# Patient Record
Sex: Female | Born: 1935 | Race: White | Hispanic: No | Smoking: Never smoker
Health system: Southern US, Community
[De-identification: ages and names within clinical notes are randomized; demographics above are authoritative.]

## PROBLEM LIST (undated history)

## (undated) DIAGNOSIS — R0600 Dyspnea, unspecified: Secondary | ICD-10-CM

## (undated) DIAGNOSIS — I10 Essential (primary) hypertension: Secondary | ICD-10-CM

## (undated) DIAGNOSIS — E039 Hypothyroidism, unspecified: Secondary | ICD-10-CM

## (undated) DIAGNOSIS — T8859XA Other complications of anesthesia, initial encounter: Secondary | ICD-10-CM

## (undated) DIAGNOSIS — J45909 Unspecified asthma, uncomplicated: Secondary | ICD-10-CM

## (undated) DIAGNOSIS — C801 Malignant (primary) neoplasm, unspecified: Secondary | ICD-10-CM

## (undated) DIAGNOSIS — J449 Chronic obstructive pulmonary disease, unspecified: Secondary | ICD-10-CM

## (undated) DIAGNOSIS — N189 Chronic kidney disease, unspecified: Secondary | ICD-10-CM

## (undated) DIAGNOSIS — M1990 Unspecified osteoarthritis, unspecified site: Secondary | ICD-10-CM

## (undated) HISTORY — PX: APPENDECTOMY: SHX54

## (undated) HISTORY — PX: ABDOMINAL HYSTERECTOMY: SHX81

---

## 2004-10-07 ENCOUNTER — Ambulatory Visit: Payer: Self-pay

## 2005-03-24 ENCOUNTER — Ambulatory Visit: Payer: Self-pay | Admitting: Unknown Physician Specialty

## 2005-04-28 ENCOUNTER — Ambulatory Visit: Payer: Self-pay | Admitting: Internal Medicine

## 2005-11-30 ENCOUNTER — Ambulatory Visit: Payer: Self-pay | Admitting: Internal Medicine

## 2005-12-08 ENCOUNTER — Ambulatory Visit: Payer: Self-pay | Admitting: Internal Medicine

## 2006-12-03 ENCOUNTER — Ambulatory Visit: Payer: Self-pay | Admitting: Internal Medicine

## 2007-01-01 ENCOUNTER — Ambulatory Visit: Payer: Self-pay | Admitting: Family Medicine

## 2008-01-01 ENCOUNTER — Ambulatory Visit: Payer: Self-pay | Admitting: Internal Medicine

## 2008-01-02 ENCOUNTER — Ambulatory Visit: Payer: Self-pay | Admitting: Internal Medicine

## 2009-01-07 ENCOUNTER — Ambulatory Visit: Payer: Self-pay | Admitting: Internal Medicine

## 2009-06-15 ENCOUNTER — Ambulatory Visit: Payer: Self-pay | Admitting: Internal Medicine

## 2009-07-12 ENCOUNTER — Ambulatory Visit: Payer: Self-pay | Admitting: Internal Medicine

## 2009-08-26 ENCOUNTER — Ambulatory Visit: Payer: Self-pay | Admitting: Surgery

## 2010-01-18 ENCOUNTER — Ambulatory Visit: Payer: Self-pay | Admitting: Internal Medicine

## 2010-07-22 ENCOUNTER — Ambulatory Visit: Payer: Self-pay | Admitting: Internal Medicine

## 2011-02-23 ENCOUNTER — Ambulatory Visit: Payer: Self-pay | Admitting: Internal Medicine

## 2011-07-13 ENCOUNTER — Ambulatory Visit: Payer: Self-pay

## 2011-10-06 ENCOUNTER — Ambulatory Visit: Payer: Self-pay

## 2012-04-30 ENCOUNTER — Ambulatory Visit: Payer: Self-pay

## 2012-08-01 ENCOUNTER — Ambulatory Visit: Payer: Self-pay

## 2013-04-29 ENCOUNTER — Ambulatory Visit: Payer: Self-pay | Admitting: Internal Medicine

## 2014-04-30 ENCOUNTER — Ambulatory Visit: Payer: Self-pay

## 2015-01-07 ENCOUNTER — Ambulatory Visit
Admission: RE | Admit: 2015-01-07 | Discharge: 2015-01-07 | Disposition: A | Discharge status: Home / Self Care | Payer: Commercial Managed Care - HMO | Source: Ambulatory Visit | Attending: Nurse Practitioner | Admitting: Nurse Practitioner

## 2015-01-07 ENCOUNTER — Other Ambulatory Visit: Payer: Self-pay | Admitting: Nurse Practitioner

## 2015-01-07 DIAGNOSIS — M545 Low back pain, unspecified: Secondary | ICD-10-CM

## 2015-01-07 DIAGNOSIS — M488X6 Other specified spondylopathies, lumbar region: Secondary | ICD-10-CM | POA: Diagnosis not present

## 2015-07-08 ENCOUNTER — Encounter: Payer: Self-pay | Admitting: *Deleted

## 2015-07-09 ENCOUNTER — Ambulatory Visit: Payer: PPO | Admitting: Anesthesiology

## 2015-07-09 ENCOUNTER — Ambulatory Visit
Admission: RE | Admit: 2015-07-09 | Discharge: 2015-07-09 | Disposition: A | Discharge status: Home / Self Care | Payer: PPO | Source: Ambulatory Visit | Attending: Unknown Physician Specialty | Admitting: Unknown Physician Specialty

## 2015-07-09 ENCOUNTER — Encounter
Admission: RE | Disposition: A | Discharge status: Home / Self Care | Payer: Self-pay | Source: Ambulatory Visit | Attending: Unknown Physician Specialty

## 2015-07-09 DIAGNOSIS — J45909 Unspecified asthma, uncomplicated: Secondary | ICD-10-CM | POA: Diagnosis not present

## 2015-07-09 DIAGNOSIS — D122 Benign neoplasm of ascending colon: Secondary | ICD-10-CM | POA: Insufficient documentation

## 2015-07-09 DIAGNOSIS — Z7982 Long term (current) use of aspirin: Secondary | ICD-10-CM | POA: Insufficient documentation

## 2015-07-09 DIAGNOSIS — Z79899 Other long term (current) drug therapy: Secondary | ICD-10-CM | POA: Diagnosis not present

## 2015-07-09 DIAGNOSIS — J449 Chronic obstructive pulmonary disease, unspecified: Secondary | ICD-10-CM | POA: Insufficient documentation

## 2015-07-09 DIAGNOSIS — Z1211 Encounter for screening for malignant neoplasm of colon: Secondary | ICD-10-CM | POA: Insufficient documentation

## 2015-07-09 DIAGNOSIS — M1990 Unspecified osteoarthritis, unspecified site: Secondary | ICD-10-CM | POA: Insufficient documentation

## 2015-07-09 DIAGNOSIS — K5730 Diverticulosis of large intestine without perforation or abscess without bleeding: Secondary | ICD-10-CM | POA: Insufficient documentation

## 2015-07-09 DIAGNOSIS — N189 Chronic kidney disease, unspecified: Secondary | ICD-10-CM | POA: Diagnosis not present

## 2015-07-09 DIAGNOSIS — K640 First degree hemorrhoids: Secondary | ICD-10-CM | POA: Diagnosis not present

## 2015-07-09 DIAGNOSIS — E039 Hypothyroidism, unspecified: Secondary | ICD-10-CM | POA: Insufficient documentation

## 2015-07-09 DIAGNOSIS — I129 Hypertensive chronic kidney disease with stage 1 through stage 4 chronic kidney disease, or unspecified chronic kidney disease: Secondary | ICD-10-CM | POA: Insufficient documentation

## 2015-07-09 DIAGNOSIS — K649 Unspecified hemorrhoids: Secondary | ICD-10-CM | POA: Diagnosis not present

## 2015-07-09 DIAGNOSIS — K5790 Diverticulosis of intestine, part unspecified, without perforation or abscess without bleeding: Secondary | ICD-10-CM | POA: Diagnosis not present

## 2015-07-09 DIAGNOSIS — K635 Polyp of colon: Secondary | ICD-10-CM | POA: Diagnosis not present

## 2015-07-09 HISTORY — DX: Hypothyroidism, unspecified: E03.9

## 2015-07-09 HISTORY — DX: Unspecified osteoarthritis, unspecified site: M19.90

## 2015-07-09 HISTORY — DX: Chronic kidney disease, unspecified: N18.9

## 2015-07-09 HISTORY — DX: Chronic obstructive pulmonary disease, unspecified: J44.9

## 2015-07-09 HISTORY — PX: COLONOSCOPY WITH PROPOFOL: SHX5780

## 2015-07-09 HISTORY — DX: Essential (primary) hypertension: I10

## 2015-07-09 HISTORY — DX: Unspecified asthma, uncomplicated: J45.909

## 2015-07-09 SURGERY — COLONOSCOPY WITH PROPOFOL
Anesthesia: General

## 2015-07-09 MED ORDER — PROPOFOL 500 MG/50ML IV EMUL
INTRAVENOUS | Status: DC | PRN
  Administered 2015-07-09: 120 ug/kg/min via INTRAVENOUS

## 2015-07-09 MED ORDER — LIDOCAINE HCL (PF) 1 % IJ SOLN
2.0000 mL | Freq: Once | INTRAMUSCULAR | Status: AC
  Administered 2015-07-09: 0.03 mL via INTRADERMAL

## 2015-07-09 MED ORDER — PROPOFOL 10 MG/ML IV BOLUS
INTRAVENOUS | Status: DC | PRN
  Administered 2015-07-09: 27.7 mg via INTRAVENOUS

## 2015-07-09 MED ORDER — MIDAZOLAM HCL 5 MG/5ML IJ SOLN
INTRAMUSCULAR | Status: DC | PRN
  Administered 2015-07-09: 1 mg via INTRAVENOUS

## 2015-07-09 MED ORDER — FENTANYL CITRATE (PF) 100 MCG/2ML IJ SOLN
INTRAMUSCULAR | Status: DC | PRN
  Administered 2015-07-09: 50 ug via INTRAVENOUS

## 2015-07-09 MED ORDER — LIDOCAINE HCL (PF) 1 % IJ SOLN
INTRAMUSCULAR | Status: AC
  Administered 2015-07-09: 0.03 mL via INTRADERMAL
  Filled 2015-07-09: qty 2

## 2015-07-09 MED ORDER — SODIUM CHLORIDE 0.9 % IV SOLN
INTRAVENOUS | Status: DC
  Administered 2015-07-09: 1000 mL via INTRAVENOUS
  Administered 2015-07-09: 14:00:00 via INTRAVENOUS

## 2015-07-09 MED ORDER — SODIUM CHLORIDE 0.9 % IV SOLN: INTRAVENOUS | Status: DC

## 2015-07-09 NOTE — Anesthesia Preprocedure Evaluation (Signed)
Anesthesia Evaluation  Patient identified by MRN, date of birth, ID band Patient awake    Reviewed: Allergy & Precautions, NPO status , Patient's Chart, lab work & pertinent test results  History of Anesthesia Complications Negative for: history of anesthetic complications  Airway Mallampati: III       Dental  (+) Upper Dentures, Lower Dentures   Pulmonary asthma , COPD,  COPD inhaler,           Cardiovascular hypertension, Pt. on medications and Pt. on home beta blockers      Neuro/Psych    GI/Hepatic   Endo/Other  Hypothyroidism   Renal/GU Renal InsufficiencyRenal disease     Musculoskeletal  (+) Arthritis , Osteoarthritis,    Abdominal   Peds  Hematology   Anesthesia Other Findings   Reproductive/Obstetrics                             Anesthesia Physical Anesthesia Plan  ASA: III  Anesthesia Plan: General   Post-op Pain Management:    Induction: Intravenous  Airway Management Planned: Nasal Cannula  Additional Equipment:   Intra-op Plan:   Post-operative Plan:   Informed Consent: I have reviewed the patients History and Physical, chart, labs and discussed the procedure including the risks, benefits and alternatives for the proposed anesthesia with the patient or authorized representative who has indicated his/her understanding and acceptance.     Plan Discussed with:   Anesthesia Plan Comments:         Anesthesia Quick Evaluation

## 2015-07-09 NOTE — H&P (Signed)
Primary Care Physician:  Ronnell Freshwater, NP Primary Gastroenterologist:  Dr. Vira Agar  Pre-Procedure History & Physical: HPI:  Brenda Tucker is a 82 y.o. female is here for an colonoscopy.   Past Medical History  Diagnosis Date  . Arthritis   . Asthma   . COPD (chronic obstructive pulmonary disease) ([place])   . Hypertension   . Hypothyroidism   . Chronic kidney disease     Past Surgical History  Procedure Laterality Date  . Appendectomy    . Abdominal hysterectomy      Prior to Admission medications   Medication Sig Start Date End Date Taking? Authorizing Provider  albuterol (PROVENTIL) (2.5 MG/3ML) 0.083% nebulizer solution Take 2.5 mg by nebulization every 6 (six) hours as needed for wheezing or shortness of breath.   Yes Historical Provider, MD  aspirin EC 81 MG tablet Take 81 mg by mouth daily.   Yes Historical Provider, MD  carvedilol (COREG) 12.5 MG tablet Take 12.5 mg by mouth 2 (two) times daily with a meal.   Yes Historical Provider, MD  Fluticasone-Salmeterol (ADVAIR) 250-50 MCG/DOSE AEPB Inhale 1 puff into the lungs 2 (two) times daily.   Yes Historical Provider, MD  hydrochlorothiazide (HYDRODIURIL) 12.5 MG tablet Take 12.5 mg by mouth daily.   Yes Historical Provider, MD  levothyroxine (SYNTHROID, LEVOTHROID) 50 MCG tablet Take 50 mcg by mouth daily before breakfast.   Yes Historical Provider, MD  losartan (COZAAR) 25 MG tablet Take 25 mg by mouth daily.   Yes Historical Provider, MD  omeprazole (PRILOSEC) 40 MG capsule Take 40 mg by mouth daily.   Yes Historical Provider, MD  traMADol (ULTRAM) 50 MG tablet Take 50 mg by mouth daily.   Yes Historical Provider, MD  vitamin B-12 (CYANOCOBALAMIN) 1000 MCG tablet Take 1,000 mcg by mouth daily.   Yes Historical Provider, MD    Allergies as of 04/28/2015  . (Not on File)    History reviewed. No pertinent family history.  Social History   Social History  . Marital Status: Widowed    Spouse Name: N/A  . Number of  Children: N/A  . Years of Education: N/A   Occupational History  . Not on file.   Social History Main Topics  . Smoking status: Never Smoker   . Smokeless tobacco: Never Used  . Alcohol Use: No  . Drug Use: No  . Sexual Activity: Not on file   Other Topics Concern  . Not on file   Social History Narrative  . No narrative on file    Review of Systems: See HPI, otherwise negative ROS  Physical Exam: BP 185/76 mmHg  Pulse 89  Temp(Src) 97.3 F (36.3 C) (Tympanic)  Resp 17  Ht 4\' 9"  (1.448 m)  Wt 55.339 kg (122 lb)  BMI 26.39 kg/m2  SpO2 90% General:   Alert,  pleasant and cooperative in NAD Head:  Normocephalic and atraumatic. Neck:  Supple; no masses or thyromegaly. Lungs:  Clear throughout to auscultation.    Heart:  Regular rate and rhythm. Abdomen:  Soft, nontender and nondistended. Normal bowel sounds, without guarding, and without rebound.   Neurologic:  Alert and  oriented x4;  grossly normal neurologically.  Impression/Plan: Brenda Tucker is here for an colonoscopy to be performed for screening  Risks, benefits, limitations, and alternatives regarding  colonoscopy have been reviewed with the patient.  Questions have been answered.  All parties agreeable.   Gaylyn Cheers, MD  07/09/2015, 1:45 PM

## 2015-07-09 NOTE — Op Note (Signed)
[facility]astroenter[facility] Patient Name: Brenda Tucker Procedure Date: 07/09/2015 1:28 PM MRN: [id number] Account #: [id number] Date of Birth: 09-15-1933 Admit Type: Outpatient Age: 82 Room: [facility] ENDO ROOM 1 Gender: Female Note Status: Finalized Procedure:         Colonoscopy Indications:       Screening for colorectal malignant neoplasm Providers:         Manya Silvas, MD Referring MD:      Lavera Guise, MD (Referring MD) Medicines:         Propofol per Anesthesia Complications:     No immediate complications. Procedure:         Pre-Anesthesia Assessment:                    - After reviewing the risks and benefits, the patient was                     deemed in satisfactory condition to undergo the procedure.                    After obtaining informed consent, the colonoscope was                     passed under direct vision. Throughout the procedure, the                     patient's blood pressure, pulse, and oxygen saturations                     were monitored continuously. The Colonoscope was                     introduced through the anus and advanced to the the cecum,                     identified by appendiceal orifice and ileocecal valve. The                     colonoscopy was somewhat difficult due to restricted                     mobility of the colon and a tortuous colon. Successful                     completion of the procedure was aided by using scope                     torsion. The patient tolerated the procedure well. The                     quality of the bowel preparation was good. Findings:      A small blood clot was seen in a hemorrhoid.      Multiple medium-mouthed diverticula were found in the sigmoid colon and       in the descending colon.      A small polyp was found in the ascending colon. The polyp was sessile.       The polyp was removed with a hot snare. Resection and retrieval were       complete. To prevent bleeding after  the polypectomy, two hemostatic       clips were successfully placed. There was no bleeding during, and at the       end, of the procedure.  Internal hemorrhoids were found during endoscopy. The hemorrhoids were       small and Grade I (internal hemorrhoids that do not prolapse). Impression:        - Diverticulosis in the sigmoid colon and in the                     descending colon.                    - One small polyp in the ascending colon. Resected and                     retrieved. Clips were placed.                    - Internal hemorrhoids. Recommendation:    - Await pathology results. Tomorrow start bath tub soaks                     twice a day for hemorrhoid. Rabelo need it lanced. Manya Silvas, MD 07/09/2015 2:16:44 PM This report has been signed electronically. Number of Addenda: 0 Note Initiated On: 07/09/2015 1:28 PM Scope Withdrawal Time: 0 hours 9 minutes 55 seconds  Total Procedure Duration: 0 hours 21 minutes 19 seconds       [facility]

## 2015-07-09 NOTE — Transfer of Care (Signed)
Immediate Anesthesia Transfer of Care Note  Patient: Brenda Tucker  Procedure(s) Performed: Procedure(s): COLONOSCOPY WITH PROPOFOL (N/A)  Patient Location: PACU  Anesthesia Type:General  Level of Consciousness: sedated  Airway & Oxygen Therapy: Patient Spontanous Breathing and Patient connected to nasal cannula oxygen  Post-op Assessment: Report given to RN and Post -op Vital signs reviewed and stable  Post vital signs: Reviewed and stable  Last Vitals:  Filed Vitals:   07/09/15 1241 07/09/15 1420  BP: 185/76 120/50  Pulse: 89 70  Temp: 36.3 C 36.1 C  Resp: 17 25    Complications: No apparent anesthesia complications

## 2015-07-09 NOTE — Anesthesia Postprocedure Evaluation (Signed)
Anesthesia Post Note  Patient: Brenda Tucker  Procedure(s) Performed: Procedure(s) (LRB): COLONOSCOPY WITH PROPOFOL (N/A)  Patient location during evaluation: PACU Anesthesia Type: General Level of consciousness: awake and alert Pain management: pain level controlled Vital Signs Assessment: post-procedure vital signs reviewed and stable Respiratory status: spontaneous breathing and respiratory function stable Cardiovascular status: stable Anesthetic complications: no    Last Vitals:  Filed Vitals:   07/09/15 1440 07/09/15 1450  BP: 146/55 156/86  Pulse: 70 69  Temp:    Resp: 20 17    Last Pain:  Filed Vitals:   07/09/15 1452  PainSc: 0-No pain                 Lorisa Scheid K

## 2015-07-13 LAB — SURGICAL PATHOLOGY

## 2015-07-15 ENCOUNTER — Encounter: Payer: Self-pay | Admitting: Unknown Physician Specialty

## 2015-08-10 DIAGNOSIS — N2581 Secondary hyperparathyroidism of renal origin: Secondary | ICD-10-CM | POA: Diagnosis not present

## 2015-08-10 DIAGNOSIS — N183 Chronic kidney disease, stage 3 (moderate): Secondary | ICD-10-CM | POA: Diagnosis not present

## 2015-08-10 DIAGNOSIS — I129 Hypertensive chronic kidney disease with stage 1 through stage 4 chronic kidney disease, or unspecified chronic kidney disease: Secondary | ICD-10-CM | POA: Diagnosis not present

## 2015-08-10 DIAGNOSIS — E559 Vitamin D deficiency, unspecified: Secondary | ICD-10-CM | POA: Diagnosis not present

## 2015-08-25 DIAGNOSIS — R0602 Shortness of breath: Secondary | ICD-10-CM | POA: Diagnosis not present

## 2015-08-26 DIAGNOSIS — N281 Cyst of kidney, acquired: Secondary | ICD-10-CM | POA: Diagnosis not present

## 2015-08-26 DIAGNOSIS — N183 Chronic kidney disease, stage 3 (moderate): Secondary | ICD-10-CM | POA: Diagnosis not present

## 2015-08-26 DIAGNOSIS — I129 Hypertensive chronic kidney disease with stage 1 through stage 4 chronic kidney disease, or unspecified chronic kidney disease: Secondary | ICD-10-CM | POA: Diagnosis not present

## 2015-08-26 DIAGNOSIS — N390 Urinary tract infection, site not specified: Secondary | ICD-10-CM | POA: Diagnosis not present

## 2015-08-26 DIAGNOSIS — I10 Essential (primary) hypertension: Secondary | ICD-10-CM | POA: Diagnosis not present

## 2015-09-06 DIAGNOSIS — N183 Chronic kidney disease, stage 3 (moderate): Secondary | ICD-10-CM | POA: Diagnosis not present

## 2015-09-06 DIAGNOSIS — R0602 Shortness of breath: Secondary | ICD-10-CM | POA: Diagnosis not present

## 2015-09-06 DIAGNOSIS — J449 Chronic obstructive pulmonary disease, unspecified: Secondary | ICD-10-CM | POA: Diagnosis not present

## 2015-09-16 DIAGNOSIS — H353132 Nonexudative age-related macular degeneration, bilateral, intermediate dry stage: Secondary | ICD-10-CM | POA: Diagnosis not present

## 2015-09-16 DIAGNOSIS — H40013 Open angle with borderline findings, low risk, bilateral: Secondary | ICD-10-CM | POA: Diagnosis not present

## 2015-09-20 DIAGNOSIS — I10 Essential (primary) hypertension: Secondary | ICD-10-CM | POA: Diagnosis not present

## 2015-09-20 DIAGNOSIS — N390 Urinary tract infection, site not specified: Secondary | ICD-10-CM | POA: Diagnosis not present

## 2015-09-20 DIAGNOSIS — J309 Allergic rhinitis, unspecified: Secondary | ICD-10-CM | POA: Diagnosis not present

## 2015-09-20 DIAGNOSIS — J449 Chronic obstructive pulmonary disease, unspecified: Secondary | ICD-10-CM | POA: Diagnosis not present

## 2015-09-20 DIAGNOSIS — E039 Hypothyroidism, unspecified: Secondary | ICD-10-CM | POA: Diagnosis not present

## 2015-09-20 DIAGNOSIS — R300 Dysuria: Secondary | ICD-10-CM | POA: Diagnosis not present

## 2015-10-15 DIAGNOSIS — R945 Abnormal results of liver function studies: Secondary | ICD-10-CM | POA: Diagnosis not present

## 2015-10-27 DIAGNOSIS — N183 Chronic kidney disease, stage 3 (moderate): Secondary | ICD-10-CM | POA: Diagnosis not present

## 2015-10-27 DIAGNOSIS — Z0001 Encounter for general adult medical examination with abnormal findings: Secondary | ICD-10-CM | POA: Diagnosis not present

## 2015-10-27 DIAGNOSIS — E039 Hypothyroidism, unspecified: Secondary | ICD-10-CM | POA: Diagnosis not present

## 2015-10-27 DIAGNOSIS — I10 Essential (primary) hypertension: Secondary | ICD-10-CM | POA: Diagnosis not present

## 2015-10-27 DIAGNOSIS — J309 Allergic rhinitis, unspecified: Secondary | ICD-10-CM | POA: Diagnosis not present

## 2015-10-28 ENCOUNTER — Other Ambulatory Visit: Payer: Self-pay | Admitting: Nurse Practitioner

## 2015-10-28 DIAGNOSIS — Z1231 Encounter for screening mammogram for malignant neoplasm of breast: Secondary | ICD-10-CM

## 2015-11-05 ENCOUNTER — Ambulatory Visit
Admission: RE | Admit: 2015-11-05 | Discharge: 2015-11-05 | Disposition: A | Discharge status: Home / Self Care | Payer: PPO | Source: Ambulatory Visit | Attending: Nurse Practitioner | Admitting: Nurse Practitioner

## 2015-11-05 DIAGNOSIS — Z1231 Encounter for screening mammogram for malignant neoplasm of breast: Secondary | ICD-10-CM | POA: Insufficient documentation

## 2016-02-11 DIAGNOSIS — I129 Hypertensive chronic kidney disease with stage 1 through stage 4 chronic kidney disease, or unspecified chronic kidney disease: Secondary | ICD-10-CM | POA: Diagnosis not present

## 2016-02-11 DIAGNOSIS — N183 Chronic kidney disease, stage 3 (moderate): Secondary | ICD-10-CM | POA: Diagnosis not present

## 2016-02-11 DIAGNOSIS — E559 Vitamin D deficiency, unspecified: Secondary | ICD-10-CM | POA: Diagnosis not present

## 2016-02-24 DIAGNOSIS — E039 Hypothyroidism, unspecified: Secondary | ICD-10-CM | POA: Diagnosis not present

## 2016-02-24 DIAGNOSIS — M5430 Sciatica, unspecified side: Secondary | ICD-10-CM | POA: Diagnosis not present

## 2016-02-24 DIAGNOSIS — M791 Myalgia: Secondary | ICD-10-CM | POA: Diagnosis not present

## 2016-02-24 DIAGNOSIS — N183 Chronic kidney disease, stage 3 (moderate): Secondary | ICD-10-CM | POA: Diagnosis not present

## 2016-02-24 DIAGNOSIS — I10 Essential (primary) hypertension: Secondary | ICD-10-CM | POA: Diagnosis not present

## 2016-03-09 DIAGNOSIS — I10 Essential (primary) hypertension: Secondary | ICD-10-CM | POA: Diagnosis not present

## 2016-03-09 DIAGNOSIS — E559 Vitamin D deficiency, unspecified: Secondary | ICD-10-CM | POA: Diagnosis not present

## 2016-03-09 DIAGNOSIS — E039 Hypothyroidism, unspecified: Secondary | ICD-10-CM | POA: Diagnosis not present

## 2016-03-09 DIAGNOSIS — Z0000 Encounter for general adult medical examination without abnormal findings: Secondary | ICD-10-CM | POA: Diagnosis not present

## 2016-03-13 DIAGNOSIS — J449 Chronic obstructive pulmonary disease, unspecified: Secondary | ICD-10-CM | POA: Diagnosis not present

## 2016-03-13 DIAGNOSIS — N183 Chronic kidney disease, stage 3 (moderate): Secondary | ICD-10-CM | POA: Diagnosis not present

## 2016-03-13 DIAGNOSIS — M542 Cervicalgia: Secondary | ICD-10-CM | POA: Diagnosis not present

## 2016-03-13 DIAGNOSIS — R0602 Shortness of breath: Secondary | ICD-10-CM | POA: Diagnosis not present

## 2016-03-30 DIAGNOSIS — H40013 Open angle with borderline findings, low risk, bilateral: Secondary | ICD-10-CM | POA: Diagnosis not present

## 2016-03-30 DIAGNOSIS — H353132 Nonexudative age-related macular degeneration, bilateral, intermediate dry stage: Secondary | ICD-10-CM | POA: Diagnosis not present

## 2016-04-07 DIAGNOSIS — H353122 Nonexudative age-related macular degeneration, left eye, intermediate dry stage: Secondary | ICD-10-CM | POA: Diagnosis not present

## 2016-06-27 ENCOUNTER — Ambulatory Visit
Admission: RE | Admit: 2016-06-27 | Discharge: 2016-06-27 | Disposition: A | Discharge status: Home / Self Care | Payer: PPO | Source: Ambulatory Visit | Attending: Nurse Practitioner | Admitting: Nurse Practitioner

## 2016-06-27 ENCOUNTER — Other Ambulatory Visit: Payer: Self-pay | Admitting: Nurse Practitioner

## 2016-06-27 DIAGNOSIS — I700 Atherosclerosis of aorta: Secondary | ICD-10-CM | POA: Insufficient documentation

## 2016-06-27 DIAGNOSIS — M25511 Pain in right shoulder: Secondary | ICD-10-CM | POA: Insufficient documentation

## 2016-06-27 DIAGNOSIS — N183 Chronic kidney disease, stage 3 (moderate): Secondary | ICD-10-CM | POA: Diagnosis not present

## 2016-06-27 DIAGNOSIS — R52 Pain, unspecified: Secondary | ICD-10-CM

## 2016-06-27 DIAGNOSIS — E039 Hypothyroidism, unspecified: Secondary | ICD-10-CM | POA: Diagnosis not present

## 2016-06-27 DIAGNOSIS — I6521 Occlusion and stenosis of right carotid artery: Secondary | ICD-10-CM | POA: Insufficient documentation

## 2016-06-27 DIAGNOSIS — J449 Chronic obstructive pulmonary disease, unspecified: Secondary | ICD-10-CM | POA: Diagnosis not present

## 2016-06-27 DIAGNOSIS — I10 Essential (primary) hypertension: Secondary | ICD-10-CM | POA: Diagnosis not present

## 2016-08-17 DIAGNOSIS — N183 Chronic kidney disease, stage 3 (moderate): Secondary | ICD-10-CM | POA: Diagnosis not present

## 2016-08-17 DIAGNOSIS — N2581 Secondary hyperparathyroidism of renal origin: Secondary | ICD-10-CM | POA: Diagnosis not present

## 2016-08-17 DIAGNOSIS — I129 Hypertensive chronic kidney disease with stage 1 through stage 4 chronic kidney disease, or unspecified chronic kidney disease: Secondary | ICD-10-CM | POA: Diagnosis not present

## 2016-08-30 DIAGNOSIS — N390 Urinary tract infection, site not specified: Secondary | ICD-10-CM | POA: Diagnosis not present

## 2016-08-30 DIAGNOSIS — I129 Hypertensive chronic kidney disease with stage 1 through stage 4 chronic kidney disease, or unspecified chronic kidney disease: Secondary | ICD-10-CM | POA: Diagnosis not present

## 2016-08-30 DIAGNOSIS — N183 Chronic kidney disease, stage 3 (moderate): Secondary | ICD-10-CM | POA: Diagnosis not present

## 2016-08-30 DIAGNOSIS — N281 Cyst of kidney, acquired: Secondary | ICD-10-CM | POA: Diagnosis not present

## 2016-09-11 DIAGNOSIS — J449 Chronic obstructive pulmonary disease, unspecified: Secondary | ICD-10-CM | POA: Diagnosis not present

## 2016-09-11 DIAGNOSIS — I6521 Occlusion and stenosis of right carotid artery: Secondary | ICD-10-CM | POA: Diagnosis not present

## 2016-09-11 DIAGNOSIS — R062 Wheezing: Secondary | ICD-10-CM | POA: Diagnosis not present

## 2016-09-11 DIAGNOSIS — R0602 Shortness of breath: Secondary | ICD-10-CM | POA: Diagnosis not present

## 2016-09-27 DIAGNOSIS — R0602 Shortness of breath: Secondary | ICD-10-CM | POA: Diagnosis not present

## 2016-10-12 DIAGNOSIS — H35352 Cystoid macular degeneration, left eye: Secondary | ICD-10-CM | POA: Diagnosis not present

## 2016-10-16 DIAGNOSIS — H353122 Nonexudative age-related macular degeneration, left eye, intermediate dry stage: Secondary | ICD-10-CM | POA: Diagnosis not present

## 2016-11-02 DIAGNOSIS — Z0001 Encounter for general adult medical examination with abnormal findings: Secondary | ICD-10-CM | POA: Diagnosis not present

## 2016-11-02 DIAGNOSIS — E039 Hypothyroidism, unspecified: Secondary | ICD-10-CM | POA: Diagnosis not present

## 2016-11-02 DIAGNOSIS — L600 Ingrowing nail: Secondary | ICD-10-CM | POA: Diagnosis not present

## 2016-11-02 DIAGNOSIS — B351 Tinea unguium: Secondary | ICD-10-CM | POA: Diagnosis not present

## 2016-11-02 DIAGNOSIS — I10 Essential (primary) hypertension: Secondary | ICD-10-CM | POA: Diagnosis not present

## 2016-11-02 DIAGNOSIS — R0602 Shortness of breath: Secondary | ICD-10-CM | POA: Diagnosis not present

## 2016-11-03 ENCOUNTER — Other Ambulatory Visit: Payer: Self-pay | Admitting: Nurse Practitioner

## 2016-11-03 DIAGNOSIS — Z1231 Encounter for screening mammogram for malignant neoplasm of breast: Secondary | ICD-10-CM

## 2016-11-14 DIAGNOSIS — M79674 Pain in right toe(s): Secondary | ICD-10-CM | POA: Diagnosis not present

## 2016-11-14 DIAGNOSIS — L600 Ingrowing nail: Secondary | ICD-10-CM | POA: Diagnosis not present

## 2016-11-14 DIAGNOSIS — M79675 Pain in left toe(s): Secondary | ICD-10-CM | POA: Diagnosis not present

## 2016-11-14 DIAGNOSIS — M216X1 Other acquired deformities of right foot: Secondary | ICD-10-CM | POA: Diagnosis not present

## 2016-11-14 DIAGNOSIS — M21541 Acquired clubfoot, right foot: Secondary | ICD-10-CM | POA: Diagnosis not present

## 2016-11-14 DIAGNOSIS — M21542 Acquired clubfoot, left foot: Secondary | ICD-10-CM | POA: Diagnosis not present

## 2016-11-14 DIAGNOSIS — B351 Tinea unguium: Secondary | ICD-10-CM | POA: Diagnosis not present

## 2016-11-14 DIAGNOSIS — M216X2 Other acquired deformities of left foot: Secondary | ICD-10-CM | POA: Diagnosis not present

## 2016-11-29 ENCOUNTER — Ambulatory Visit
Admission: RE | Admit: 2016-11-29 | Discharge: 2016-11-29 | Disposition: A | Discharge status: Home / Self Care | Payer: PPO | Source: Ambulatory Visit | Attending: Nurse Practitioner | Admitting: Nurse Practitioner

## 2016-11-29 DIAGNOSIS — Z1231 Encounter for screening mammogram for malignant neoplasm of breast: Secondary | ICD-10-CM | POA: Diagnosis not present

## 2016-12-11 DIAGNOSIS — H353132 Nonexudative age-related macular degeneration, bilateral, intermediate dry stage: Secondary | ICD-10-CM | POA: Diagnosis not present

## 2017-02-08 DIAGNOSIS — N390 Urinary tract infection, site not specified: Secondary | ICD-10-CM | POA: Diagnosis not present

## 2017-02-08 DIAGNOSIS — I129 Hypertensive chronic kidney disease with stage 1 through stage 4 chronic kidney disease, or unspecified chronic kidney disease: Secondary | ICD-10-CM | POA: Diagnosis not present

## 2017-02-08 DIAGNOSIS — R319 Hematuria, unspecified: Secondary | ICD-10-CM | POA: Diagnosis not present

## 2017-02-08 DIAGNOSIS — N183 Chronic kidney disease, stage 3 (moderate): Secondary | ICD-10-CM | POA: Diagnosis not present

## 2017-02-08 DIAGNOSIS — R809 Proteinuria, unspecified: Secondary | ICD-10-CM | POA: Diagnosis not present

## 2017-02-13 DIAGNOSIS — N183 Chronic kidney disease, stage 3 (moderate): Secondary | ICD-10-CM | POA: Diagnosis not present

## 2017-02-13 DIAGNOSIS — N390 Urinary tract infection, site not specified: Secondary | ICD-10-CM | POA: Diagnosis not present

## 2017-02-13 DIAGNOSIS — N281 Cyst of kidney, acquired: Secondary | ICD-10-CM | POA: Diagnosis not present

## 2017-02-13 DIAGNOSIS — I129 Hypertensive chronic kidney disease with stage 1 through stage 4 chronic kidney disease, or unspecified chronic kidney disease: Secondary | ICD-10-CM | POA: Diagnosis not present

## 2017-02-13 DIAGNOSIS — I10 Essential (primary) hypertension: Secondary | ICD-10-CM | POA: Diagnosis not present

## 2017-03-06 DIAGNOSIS — N183 Chronic kidney disease, stage 3 (moderate): Secondary | ICD-10-CM | POA: Diagnosis not present

## 2017-03-06 DIAGNOSIS — I10 Essential (primary) hypertension: Secondary | ICD-10-CM | POA: Diagnosis not present

## 2017-03-06 DIAGNOSIS — E039 Hypothyroidism, unspecified: Secondary | ICD-10-CM | POA: Diagnosis not present

## 2017-03-06 DIAGNOSIS — J449 Chronic obstructive pulmonary disease, unspecified: Secondary | ICD-10-CM | POA: Diagnosis not present

## 2017-03-12 DIAGNOSIS — N183 Chronic kidney disease, stage 3 (moderate): Secondary | ICD-10-CM | POA: Diagnosis not present

## 2017-03-12 DIAGNOSIS — R0602 Shortness of breath: Secondary | ICD-10-CM | POA: Diagnosis not present

## 2017-03-12 DIAGNOSIS — J449 Chronic obstructive pulmonary disease, unspecified: Secondary | ICD-10-CM | POA: Diagnosis not present

## 2017-03-13 DIAGNOSIS — H353132 Nonexudative age-related macular degeneration, bilateral, intermediate dry stage: Secondary | ICD-10-CM | POA: Diagnosis not present

## 2017-03-19 DIAGNOSIS — E785 Hyperlipidemia, unspecified: Secondary | ICD-10-CM | POA: Diagnosis not present

## 2017-03-19 DIAGNOSIS — N183 Chronic kidney disease, stage 3 (moderate): Secondary | ICD-10-CM | POA: Diagnosis not present

## 2017-03-19 DIAGNOSIS — N390 Urinary tract infection, site not specified: Secondary | ICD-10-CM | POA: Diagnosis not present

## 2017-03-19 DIAGNOSIS — E875 Hyperkalemia: Secondary | ICD-10-CM | POA: Diagnosis not present

## 2017-03-19 DIAGNOSIS — R809 Proteinuria, unspecified: Secondary | ICD-10-CM | POA: Diagnosis not present

## 2017-03-19 DIAGNOSIS — I129 Hypertensive chronic kidney disease with stage 1 through stage 4 chronic kidney disease, or unspecified chronic kidney disease: Secondary | ICD-10-CM | POA: Diagnosis not present

## 2017-04-13 DIAGNOSIS — H35352 Cystoid macular degeneration, left eye: Secondary | ICD-10-CM | POA: Diagnosis not present

## 2017-07-04 ENCOUNTER — Ambulatory Visit: Payer: Self-pay | Admitting: Nurse Practitioner

## 2017-07-16 ENCOUNTER — Other Ambulatory Visit: Payer: Self-pay

## 2017-07-16 DIAGNOSIS — R809 Proteinuria, unspecified: Secondary | ICD-10-CM | POA: Diagnosis not present

## 2017-07-16 DIAGNOSIS — I129 Hypertensive chronic kidney disease with stage 1 through stage 4 chronic kidney disease, or unspecified chronic kidney disease: Secondary | ICD-10-CM | POA: Diagnosis not present

## 2017-07-16 DIAGNOSIS — R319 Hematuria, unspecified: Secondary | ICD-10-CM | POA: Diagnosis not present

## 2017-07-16 DIAGNOSIS — N183 Chronic kidney disease, stage 3 (moderate): Secondary | ICD-10-CM | POA: Diagnosis not present

## 2017-07-16 DIAGNOSIS — N390 Urinary tract infection, site not specified: Secondary | ICD-10-CM | POA: Diagnosis not present

## 2017-07-16 MED ORDER — FLUTICASONE-SALMETEROL 250-50 MCG/DOSE IN AEPB: 1.0000 | INHALATION_SPRAY | Freq: Two times a day (BID) | RESPIRATORY_TRACT | 3 refills | Status: DC

## 2017-08-02 ENCOUNTER — Other Ambulatory Visit: Payer: Self-pay

## 2017-08-02 MED ORDER — CARVEDILOL 12.5 MG PO TABS: 12.5000 mg | ORAL_TABLET | Freq: Two times a day (BID) | ORAL | 1 refills | Status: DC

## 2017-08-17 ENCOUNTER — Encounter: Payer: Self-pay | Admitting: Nurse Practitioner

## 2017-08-17 ENCOUNTER — Ambulatory Visit (INDEPENDENT_AMBULATORY_CARE_PROVIDER_SITE_OTHER): Payer: PPO | Admitting: Nurse Practitioner

## 2017-08-17 VITALS — BP 168/81 | HR 65 | Resp 16 | Ht <= 58 in | Wt 133.0 lb

## 2017-08-17 DIAGNOSIS — K219 Gastro-esophageal reflux disease without esophagitis: Secondary | ICD-10-CM | POA: Diagnosis not present

## 2017-08-17 DIAGNOSIS — E785 Hyperlipidemia, unspecified: Secondary | ICD-10-CM | POA: Diagnosis not present

## 2017-08-17 DIAGNOSIS — R0602 Shortness of breath: Secondary | ICD-10-CM | POA: Insufficient documentation

## 2017-08-17 DIAGNOSIS — E039 Hypothyroidism, unspecified: Secondary | ICD-10-CM

## 2017-08-17 DIAGNOSIS — J449 Chronic obstructive pulmonary disease, unspecified: Secondary | ICD-10-CM | POA: Insufficient documentation

## 2017-08-17 DIAGNOSIS — M1990 Unspecified osteoarthritis, unspecified site: Secondary | ICD-10-CM | POA: Diagnosis not present

## 2017-08-17 DIAGNOSIS — I6523 Occlusion and stenosis of bilateral carotid arteries: Secondary | ICD-10-CM | POA: Diagnosis not present

## 2017-08-17 DIAGNOSIS — I10 Essential (primary) hypertension: Secondary | ICD-10-CM | POA: Insufficient documentation

## 2017-08-17 MED ORDER — TRAMADOL HCL 50 MG PO TABS: 50.0000 mg | ORAL_TABLET | Freq: Every day | ORAL | 2 refills | Status: DC

## 2017-08-17 MED ORDER — OMEPRAZOLE 40 MG PO CPDR: 40.0000 mg | DELAYED_RELEASE_CAPSULE | Freq: Every day | ORAL | 4 refills | Status: DC

## 2017-08-17 MED ORDER — AMLODIPINE BESYLATE 5 MG PO TABS: 5.0000 mg | ORAL_TABLET | Freq: Every day | ORAL | 4 refills | Status: DC

## 2017-08-17 MED ORDER — LOSARTAN POTASSIUM 25 MG PO TABS: 25.0000 mg | ORAL_TABLET | Freq: Every day | ORAL | 0 refills | Status: DC

## 2017-08-17 MED ORDER — HYDROCHLOROTHIAZIDE 12.5 MG PO TABS: 12.5000 mg | ORAL_TABLET | Freq: Every day | ORAL | 4 refills | Status: DC

## 2017-08-17 MED ORDER — LEVOTHYROXINE SODIUM 50 MCG PO TABS: 50.0000 ug | ORAL_TABLET | Freq: Every day | ORAL | 4 refills | Status: DC

## 2017-08-17 NOTE — Progress Notes (Signed)
[facility] [place], [place] [address]  Internal MEDICINE  Office Visit Note  Patient Name: Brenda Tucker  [id number]  [id number]  Date of Service: 08/26/2017  Chief Complaint  Patient presents with  . Follow-up    The patient is here for routine follow up exam. She is complaining of vaginal itching. She had this issue when she saw her kidney doctor last. She was put on cipro for 5 days .this seemed to make the problem more severe. She states that itching and irritation has just become more severe.  She does get short of breath with exertion. She would like to have a handicap placard for her car.     Pt is here for routine follow up.    Current Medication: Outpatient Encounter Medications as of 08/17/2017  Medication Sig  . amLODipine (NORVASC) 5 MG tablet Take 1 tablet (5 mg total) by mouth daily.  Marland Kitchen aspirin EC 81 MG tablet Take 81 mg by mouth daily.  . carvedilol (COREG) 12.5 MG tablet Take 1 tablet (12.5 mg total) by mouth 2 (two) times daily with a meal.  . Cholecalciferol (VITAMIN D PO) Take by mouth.  . Fluticasone-Salmeterol (ADVAIR) 250-50 MCG/DOSE AEPB Inhale 1 puff into the lungs 2 (two) times daily.  Marland Kitchen levothyroxine (SYNTHROID, LEVOTHROID) 50 MCG tablet Take 1 tablet (50 mcg total) by mouth daily before breakfast.  . losartan (COZAAR) 25 MG tablet Take 1 tablet (25 mg total) by mouth daily.  . vitamin B-12 (CYANOCOBALAMIN) 1000 MCG tablet Take 1,000 mcg by mouth daily.  . [DISCONTINUED] amLODipine (NORVASC) 5 MG tablet   . [DISCONTINUED] levothyroxine (SYNTHROID, LEVOTHROID) 50 MCG tablet Take 50 mcg by mouth daily before breakfast.  . [DISCONTINUED] losartan (COZAAR) 25 MG tablet Take 25 mg by mouth daily.  Marland Kitchen albuterol (PROVENTIL) (2.5 MG/3ML) 0.083% nebulizer solution Take 2.5 mg by nebulization every 6 (six) hours as needed for wheezing or shortness of breath.  . ciprofloxacin (CIPRO) 500 MG tablet   . FLUARIX QUADRIVALENT 0.5 ML injection    . hydrochlorothiazide (HYDRODIURIL) 12.5 MG tablet Take 1 tablet (12.5 mg total) by mouth daily.  Marland Kitchen omeprazole (PRILOSEC) 40 MG capsule Take 1 capsule (40 mg total) by mouth daily.  . traMADol (ULTRAM) 50 MG tablet Take 1 tablet (50 mg total) by mouth daily.  . [DISCONTINUED] hydrochlorothiazide (HYDRODIURIL) 12.5 MG tablet Take 12.5 mg by mouth daily.  . [DISCONTINUED] omeprazole (PRILOSEC) 40 MG capsule Take 40 mg by mouth daily.  . [DISCONTINUED] traMADol (ULTRAM) 50 MG tablet Take 50 mg by mouth daily.   No facility-administered encounter medications on file as of 08/17/2017.     Surgical History: Past Surgical History:  Procedure Laterality Date  . ABDOMINAL HYSTERECTOMY    . APPENDECTOMY    . COLONOSCOPY WITH PROPOFOL N/A 07/09/2015   Procedure: COLONOSCOPY WITH PROPOFOL;  Surgeon: Manya Silvas, MD;  Location: [facility] ENDOSCOPY;  Service: Endoscopy;  Laterality: N/A;    Medical History: Past Medical History:  Diagnosis Date  . Arthritis   . Asthma   . Chronic kidney disease   . COPD (chronic obstructive pulmonary disease) ([place])   . Hypertension   . Hypothyroidism     Family History: History reviewed. No pertinent family history.  Social History   Socioeconomic History  . Marital status: Widowed    Spouse name: Not on file  . Number of children: Not on file  . Years of education: Not on file  . Highest education level:  Not on file  Social Needs  . Financial resource strain: Not on file  . Food insecurity - worry: Not on file  . Food insecurity - inability: Not on file  . Transportation needs - medical: Not on file  . Transportation needs - non-medical: Not on file  Occupational History  . Not on file  Tobacco Use  . Smoking status: Never Smoker  . Smokeless tobacco: Never Used  Substance and Sexual Activity  . Alcohol use: No  . Drug use: No  . Sexual activity: Not on file  Other Topics Concern  . Not on file  Social History Narrative  . Not on file       Review of Systems  Constitutional: Negative for activity change, chills, fatigue and unexpected weight change.  HENT: Negative for congestion, postnasal drip, rhinorrhea, sneezing and sore throat.   Eyes: Negative for redness.  Respiratory: Negative for cough, chest tightness and shortness of breath.        Shortness of breath with exertion.  Cardiovascular: Negative for chest pain and palpitations.  Gastrointestinal: Negative for abdominal pain, constipation, diarrhea, nausea and vomiting.  Genitourinary: Negative for dysuria and frequency.  Musculoskeletal: Positive for arthralgias. Negative for back pain, joint swelling and neck pain.  Skin: Negative for rash.  Neurological: Negative.  Negative for tremors and numbness.  Hematological: Negative for adenopathy. Does not bruise/bleed easily.  Psychiatric/Behavioral: Negative for behavioral problems (Depression), sleep disturbance and suicidal ideas. The patient is not nervous/anxious.     Today's Vitals   08/17/17 1059  BP: (!) 168/81  Pulse: 65  Resp: 16  SpO2: 95%  Weight: 133 lb (60.3 kg)  Height: 4\' 9"  (1.448 m)    Physical Exam  Constitutional: She is oriented to person, place, and time. She appears well-developed and well-nourished. No distress.  HENT:  Head: Normocephalic and atraumatic.  Mouth/Throat: Oropharynx is clear and moist. No oropharyngeal exudate.  Eyes: EOM are normal. Pupils are equal, round, and reactive to light.  Neck: Normal range of motion. Neck supple. No JVD present. Carotid bruit is not present. No tracheal deviation present. No thyromegaly present.  Cardiovascular: Normal rate, regular rhythm and normal heart sounds. Exam reveals no gallop and no friction rub.  No murmur heard. Pulmonary/Chest: Effort normal and breath sounds normal. No respiratory distress. She has no wheezes. She has no rales. She exhibits no tenderness.  Abdominal: Soft. Bowel sounds are normal. There is no tenderness.   Musculoskeletal: Normal range of motion.  Lymphadenopathy:    She has no cervical adenopathy.  Neurological: She is alert and oriented to person, place, and time. No cranial nerve deficit.  Skin: Skin is warm and dry. She is not diaphoretic.  Psychiatric: She has a normal mood and affect. Her behavior is normal. Judgment and thought content normal.  Nursing note and vitals reviewed.      Assessment/Plan: 1. Hypertension, unspecified type Generally well controlled. rnewed bp medications with 90 day prescriptions.  - hydrochlorothiazide (HYDRODIURIL) 12.5 MG tablet; Take 1 tablet (12.5 mg total) by mouth daily.  Dispense: 90 tablet; Refill: 4 - losartan (COZAAR) 25 MG tablet; Take 1 tablet (25 mg total) by mouth daily.  Dispense: 90 tablet; Refill: 0 - amLODipine (NORVASC) 5 MG tablet; Take 1 tablet (5 mg total) by mouth daily.  Dispense: 90 tablet; Refill: 4  2. Hypothyroidism, unspecified type Continue levothyroxine as prescribed.  - levothyroxine (SYNTHROID, LEVOTHROID) 50 MCG tablet; Take 1 tablet (50 mcg total) by mouth daily before breakfast.  Dispense: 90 tablet; Refill: 4  3. Chronic obstructive pulmonary disease, unspecified COPD type ([place]) Lungs clear today. Continue inhalers and respiratory medications as prescribed   4. Gastroesophageal reflux disease without esophagitis - omeprazole (PRILOSEC) 40 MG capsule; Take 1 capsule (40 mg total) by mouth daily.  Dispense: 90 capsule; Refill: 4   5. Osteoarthritis, unspecified osteoarthritis type, unspecified site Mostly in right sholder and lower back. Esterline take limited ibuprofen as needed. [place] use tramadol as needed and as prescribed, for pain which is not relieved by initial dose of ibuprofen.  - traMADol (ULTRAM) 50 MG tablet; Take 1 tablet (50 mg total) by mouth daily.  Dispense: 30 tablet; Refill: 2  6. Bilateral carotid artery stenosis History 50-69% stenosis right carotid and <50% stenosis left. Repeat carotid doppler  prior to next visit.  - US Carotid Duplex Bilateral; Future  General Counseling: Purity verbalizes understanding of the findings of todays visit and agrees with plan of treatment. I have discussed any further diagnostic evaluation that Guiney be needed or ordered today. We also reviewed her medications today. she has been encouraged to call the office with any questions or concerns that should arise related to todays visit.   Orders Placed This Encounter  Procedures  . US Carotid Duplex Bilateral    Meds ordered this encounter  Medications  . hydrochlorothiazide (HYDRODIURIL) 12.5 MG tablet    Sig: Take 1 tablet (12.5 mg total) by mouth daily.    Dispense:  90 tablet    Refill:  4    Order Specific Question:   Supervising Provider    Answer:   Lavera Guise [[id number]]  . levothyroxine (SYNTHROID, LEVOTHROID) 50 MCG tablet    Sig: Take 1 tablet (50 mcg total) by mouth daily before breakfast.    Dispense:  90 tablet    Refill:  4    Order Specific Question:   Supervising Provider    Answer:   Lavera Guise [place]  . losartan (COZAAR) 25 MG tablet    Sig: Take 1 tablet (25 mg total) by mouth daily.    Dispense:  90 tablet    Refill:  0    Order Specific Question:   Supervising Provider    Answer:   Lavera Guise [[id number]]  . omeprazole (PRILOSEC) 40 MG capsule    Sig: Take 1 capsule (40 mg total) by mouth daily.    Dispense:  90 capsule    Refill:  4    Order Specific Question:   Supervising Provider    Answer:   Lavera Guise [[id number]]  . amLODipine (NORVASC) 5 MG tablet    Sig: Take 1 tablet (5 mg total) by mouth daily.    Dispense:  90 tablet    Refill:  4    Order Specific Question:   Supervising Provider    Answer:   Lavera Guise [[id number]]  . traMADol (ULTRAM) 50 MG tablet    Sig: Take 1 tablet (50 mg total) by mouth daily.    Dispense:  30 tablet    Refill:  2    Order Specific Question:   Supervising Provider    Answer:   Lavera Guise [[id number]]    Time spent: 88 Minutes   Dr  Lavera Guise Internal medicine

## 2017-08-27 ENCOUNTER — Ambulatory Visit: Payer: PPO

## 2017-08-27 DIAGNOSIS — I6523 Occlusion and stenosis of bilateral carotid arteries: Secondary | ICD-10-CM

## 2017-09-04 ENCOUNTER — Telehealth: Payer: Self-pay

## 2017-09-04 NOTE — Telephone Encounter (Signed)
PATIENTS HANDICAP DISABILITY CARD UP FRONT READY PICKUP/ BR

## 2017-09-10 ENCOUNTER — Encounter: Payer: Self-pay | Admitting: Internal Medicine

## 2017-09-10 ENCOUNTER — Ambulatory Visit: Payer: PPO | Admitting: Internal Medicine

## 2017-09-10 VITALS — BP 132/58 | HR 74 | Resp 16 | Ht <= 58 in | Wt 133.6 lb

## 2017-09-10 DIAGNOSIS — R0602 Shortness of breath: Secondary | ICD-10-CM | POA: Diagnosis not present

## 2017-09-10 DIAGNOSIS — K219 Gastro-esophageal reflux disease without esophagitis: Secondary | ICD-10-CM

## 2017-09-10 DIAGNOSIS — J449 Chronic obstructive pulmonary disease, unspecified: Secondary | ICD-10-CM

## 2017-09-10 NOTE — Progress Notes (Signed)
[facility] [place], [place] [address]  Pulmonary Sleep Medicine  Office Visit Note  Patient Name: Brenda Tucker DOB: Feb 06, 1934 MRN [id number]  Date of Service: 09/10/2017  Complaints/HPI: doing well other than some SOB noted. She states that she is using her inhalers. She has noted cough and some sputum production. Patient states it is not as bad as it has been in the past. She is not on oxygen at this time. She has no chest pain no palpitations noted.   ROS  General: (-) fever, (-) chills, (-) night sweats, (-) weakness Skin: (-) rashes, (-) itching,. Eyes: (-) visual changes, (-) redness, (-) itching. Nose and Sinuses: (-) nasal stuffiness or itchiness, (-) postnasal drip, (-) nosebleeds, (-) sinus trouble. Mouth and Throat: (-) sore throat, (-) hoarseness. Neck: (-) swollen glands, (-) enlarged thyroid, (-) neck pain. Respiratory: + cough, (-) bloody sputum, + shortness of breath, + wheezing. Cardiovascular: - ankle swelling, (-) chest pain. Lymphatic: (-) lymph node enlargement. Neurologic: (-) numbness, (-) tingling. Psychiatric: (-) anxiety, (-) depression   Current Medication: Outpatient Encounter Medications as of 09/10/2017  Medication Sig  . amLODipine (NORVASC) 5 MG tablet Take 1 tablet (5 mg total) by mouth daily.  Marland Kitchen aspirin EC 81 MG tablet Take 81 mg by mouth daily.  . carvedilol (COREG) 12.5 MG tablet Take 1 tablet (12.5 mg total) by mouth 2 (two) times daily with a meal.  . Cholecalciferol (VITAMIN D PO) Take by mouth.  . Fluticasone-Salmeterol (ADVAIR) 250-50 MCG/DOSE AEPB Inhale 1 puff into the lungs 2 (two) times daily.  . hydrochlorothiazide (HYDRODIURIL) 12.5 MG tablet Take 1 tablet (12.5 mg total) by mouth daily.  Marland Kitchen levothyroxine (SYNTHROID, LEVOTHROID) 50 MCG tablet Take 1 tablet (50 mcg total) by mouth daily before breakfast.  . losartan (COZAAR) 25 MG tablet Take 1 tablet (25 mg total) by mouth daily.  . Multiple  Vitamins-Minerals (OCUVITE ADULT FORMULA PO) Take by mouth.  Marland Kitchen omeprazole (PRILOSEC) 40 MG capsule Take 1 capsule (40 mg total) by mouth daily.  . traMADol (ULTRAM) 50 MG tablet Take 1 tablet (50 mg total) by mouth daily.  . vitamin B-12 (CYANOCOBALAMIN) 1000 MCG tablet Take 1,000 mcg by mouth daily.  Marland Kitchen albuterol (PROVENTIL) (2.5 MG/3ML) 0.083% nebulizer solution Take 2.5 mg by nebulization every 6 (six) hours as needed for wheezing or shortness of breath.  . [DISCONTINUED] ciprofloxacin (CIPRO) 500 MG tablet   . [DISCONTINUED] FLUARIX QUADRIVALENT 0.5 ML injection    No facility-administered encounter medications on file as of 09/10/2017.     Surgical History: Past Surgical History:  Procedure Laterality Date  . ABDOMINAL HYSTERECTOMY    . APPENDECTOMY    . COLONOSCOPY WITH PROPOFOL N/A 07/09/2015   Procedure: COLONOSCOPY WITH PROPOFOL;  Surgeon: Manya Silvas, MD;  Location: [facility] ENDOSCOPY;  Service: Endoscopy;  Laterality: N/A;    Medical History: Past Medical History:  Diagnosis Date  . Arthritis   . Asthma   . Chronic kidney disease   . COPD (chronic obstructive pulmonary disease) ([place])   . Hypertension   . Hypothyroidism     Family History: History reviewed. No pertinent family history.  Social History: Social History   Socioeconomic History  . Marital status: Widowed    Spouse name: Not on file  . Number of children: Not on file  . Years of education: Not on file  . Highest education level: Not on file  Social Needs  . Financial resource strain: Not on file  .  Food insecurity - worry: Not on file  . Food insecurity - inability: Not on file  . Transportation needs - medical: Not on file  . Transportation needs - non-medical: Not on file  Occupational History  . Not on file  Tobacco Use  . Smoking status: Never Smoker  . Smokeless tobacco: Never Used  Substance and Sexual Activity  . Alcohol use: No  . Drug use: No  . Sexual activity: Not on file  Other  Topics Concern  . Not on file  Social History Narrative  . Not on file    Vital Signs: Blood pressure (!) 132/58, pulse 74, resp. rate 16, height 4\' 9"  (1.448 m), weight 133 lb 9.6 oz (60.6 kg), SpO2 97 %.  Examination: General Appearance: The patient is well-developed, well-nourished, and in no distress. Skin: Gross inspection of skin unremarkable. Head: normocephalic, no gross deformities. Eyes: no gross deformities noted. ENT: ears appear grossly normal no exudates. Neck: Supple. No thyromegaly. No LAD. Respiratory: no rhonchi noted at this time. Cardiovascular: Normal S1 and S2 without murmur or rub. Extremities: No cyanosis. pulses are equal. Neurologic: Alert and oriented. No involuntary movements.  LABS: No results found for this or any previous visit (from the past 2160 hour(s)).  Radiology: Mm Digital Screening Bilateral  Result Date: 11/29/2016 CLINICAL DATA:  Screening. EXAM: DIGITAL SCREENING BILATERAL MAMMOGRAM WITH CAD COMPARISON:  Previous exam(s). ACR Breast Density Category b: There are scattered areas of fibroglandular density. FINDINGS: There are no findings suspicious for malignancy. Images were processed with CAD. IMPRESSION: No mammographic evidence of malignancy. A result letter of this screening mammogram will be mailed directly to the patient. RECOMMENDATION: Screening mammogram in one year. (Code:SM-B-01Y) BI-RADS CATEGORY  1: Negative. Electronically Signed   By: Pamelia Hoit M.D.   On: 11/29/2016 10:22    No results found.  No results found.    Assessment and Plan: Patient Active Problem List   Diagnosis Date Noted  . COPD (chronic obstructive pulmonary disease) ([place]) 08/17/2017  . Shortness of breath 08/17/2017  . GERD (gastroesophageal reflux disease) 08/17/2017  . Hyperlipidemia 08/17/2017  . Hypertension 08/17/2017  . Hypothyroidism 08/17/2017  . Osteoarthritis 08/17/2017    1. COPD Doing fine with current inhalers.  Will monitor  response Needs to monitor her sputum color and amount right now no increase noted  2. GERD Stable on medical management  3. SOB Will need follow up PFT   General Counseling: I have discussed the findings of the evaluation and examination with [facility].  I have also discussed any further diagnostic evaluation thatmay be needed or ordered today. Seva verbalizes understanding of the findings of todays visit. We also reviewed her medications today and discussed drug interactions and side effects including but not limited excessive drowsiness and altered mental states. We also discussed that there is always a risk not just to her but also people around her. she has been encouraged to call the office with any questions or concerns that should arise related to todays visit.    Time spent: 69min  I have personally obtained a history, examined the patient, evaluated laboratory and imaging results, formulated the assessment and plan and placed orders.    Allyne Gee, MD [facility] Pulmonary and Critical Care Sleep medicine

## 2017-09-10 NOTE — Patient Instructions (Signed)
Pulmonary Function Tests Pulmonary function tests (PFTs) are used to measure how well your lungs work, find out what is causing your lung problems, and figure out the best treatment for you. You Brenda Tucker have PFTs:  When you have an illness involving the lungs.  To follow changes in your lung function over time if you have a chronic lung disease.  If you are an industrial plant worker. This checks the effects of being exposed to chemicals over a long period of time.  To check lung function before having surgery or other procedures.  To check your lungs if you smoke.  To check if prescribed medicines or treatments are helping your lungs.  Your results will be compared to the expected lung function of someone with healthy lungs who is similar to you in:  Age.  Gender.  Height.  Weight.  Race or ethnicity.  This is done to show how your lungs compare to normal lung function (percent predicted). This is how your health care provider knows if your lung function is normal or not. If you have had PFTs done before, your health care provider will compare your current results with past results. This shows if your lung function is better, worse, or the same as before. Tell a health care provider about:  Any allergies you have.  All medicines you are taking, including inhaler or nebulizer medicines, vitamins, herbs, eye drops, creams, and over-the-counter medicines.  Any blood disorders you have.  Any surgeries you have had, especially recent eye surgery, abdominal surgery, or chest surgery. These can make PFTs difficult or unsafe.  Any medical conditions you have, including chest pain or heart problems, tuberculosis, or respiratory infections such as pneumonia, a cold, or the flu.  Any fear of being in closed spaces (claustrophobia). Some of your tests Brenda Tucker be in a closed space. What are the risks? Generally, this is a safe procedure. However, problems Brenda Tucker occur,  including:  Light-headedness due to over-breathing (hyperventilation).  An asthma attack from deep breathing.  A collapsed lung.  What happens before the procedure?  Take over-the-counter and prescription medicines only as told by your health care provider. If you take inhaler or nebulizer medicines, ask your health care provider which medicines you should take on the day of your testing. Some inhaler medicines Brenda Tucker interfere with PFTs if they are taken shortly before the tests.  Follow your health care provider's instructions on eating and drinking restrictions. This Brenda Tucker include avoiding eating large meals and drinking alcohol before the testing.  Do not use any products that contain nicotine or tobacco, such as cigarettes and e-cigarettes. If you need help quitting, ask your health care provider.  Wear comfortable clothing that will not interfere with breathing. What happens during the procedure?  You will be given a soft nose clip to wear. This is done so all of your breaths will go through your mouth instead of your nose.  You will be given a germ-free (sterile) mouthpiece. It will be attached to a machine that measures your breathing (spirometer).  You will be asked to do various breathing maneuvers. The maneuvers will be done by breathing in (inhaling) and breathing out (exhaling). You Brenda Tucker be asked to repeat the maneuvers several times before the testing is done.  It is important to follow the instructions exactly to get accurate results. Make sure to blow as hard and as fast as you can when you are told to do so.  You Brenda Tucker be given a medicine that   makes the small air passages in your lungs larger (bronchodilator) after testing has been done. This medicine will make it easier for you to breathe.  The tests will be repeated after the bronchodilator has taken effect.  You will be monitored carefully during the procedure for faintness, dizziness, trouble breathing, or any other  problems. The procedure Brenda Tucker vary among health care providers and hospitals. What happens after the procedure?  It is up to you to get your test results. Ask your health care provider, or the department that is doing the tests, when your results will be ready. After you have received your test results, talk with your health care provider about treatment options, if necessary. Summary  Pulmonary function tests (PFTs) are used to measure how well your lungs work, find out what is causing your lung problems, and figure out the best treatment for you.  Wear comfortable clothing that will not interfere with breathing.  It is up to you to get your test results. After you have received them, talk with your health care provider about treatment options, if necessary. This information is not intended to replace advice given to you by your health care provider. Make sure you discuss any questions you have with your health care provider. Document Released: 02/10/2004 Document Revised: 05/11/2016 Document Reviewed: 05/11/2016 Elsevier Interactive Patient Education  2018 [employer]  

## 2017-09-11 ENCOUNTER — Other Ambulatory Visit: Payer: Self-pay | Admitting: Nurse Practitioner

## 2017-09-11 DIAGNOSIS — B379 Candidiasis, unspecified: Secondary | ICD-10-CM

## 2017-09-11 MED ORDER — FLUCONAZOLE 150 MG PO TABS: ORAL_TABLET | ORAL | 0 refills | Status: DC

## 2017-09-11 NOTE — Progress Notes (Signed)
Sent diflucan 150mg  tablets to pharmacy. Take once. [place] repeat dose if symptoms are persistent after 3 days. Sent to [facility].

## 2017-09-17 DIAGNOSIS — H35352 Cystoid macular degeneration, left eye: Secondary | ICD-10-CM | POA: Diagnosis not present

## 2017-09-17 NOTE — Procedures (Signed)
[place], [place] [address]  DATE OF SERVICE:  August 27, 2017  CAROTID DOPPLER INTERPRETATION:  Bilateral Carotid Ultrsasound and Color Doppler Examination was performed. The RIGHT CCA shows  Moderate to severe plaque in the vessel. The LEFT CCA shows  moderate plaque in the vessel. There was  minimal intimal thickening noted in the RIGHT carotid artery. There was  minimal intimal thickening in the LEFT carotid artery.  The RIGHT CCA shows peak systolic velocity of  03JK per second. The end diastolic velocity is  09FG per second on the RIGHT side. The RIGHT ICA shows peak systolic velocity of  182 per second. RIGHT sided ICA end diastolic velocity is  99BZ per second. The RIGHT ECA shows a peak systolic velocity of  169CV per second. The ICA/CCA ratio is calculated to be  2.42. This suggests  50-69%. The Vertebral Artery shows  antegrade flow.  The LEFT CCA shows peak systolic velocity of  89FY per second. The end diastolic velocity is  10FB per second on the LEFT side. The LEFT ICA shows peak systolic velocity of  510CHE second. LEFT sided ICA end diastolic velocity is  52DP per second.  The ICA/CCA ratio is calculated to be  1.2. This suggests  50-60.   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 The LEFT CAROTID shows  50-60% occlusion.  There is  Moderate to severe plaque formation noted on the LEFT and  Moderate to severe on the RIGHT  side. Consider a repeat Carotid doppler if clinical situation and symptoms warrant in 6-12 months. Patient should be encouraged to change lifestyles such as smoking cessation, regular exercise and dietary modification. Use of statins in the right clinical setting and ASA is encouraged.  Allyne Gee, MD [facility] Pulmonary Critical Care Medicine

## 2017-09-24 DIAGNOSIS — H353132 Nonexudative age-related macular degeneration, bilateral, intermediate dry stage: Secondary | ICD-10-CM | POA: Diagnosis not present

## 2017-09-26 ENCOUNTER — Ambulatory Visit: Payer: PPO | Admitting: Internal Medicine

## 2017-09-26 DIAGNOSIS — R0602 Shortness of breath: Secondary | ICD-10-CM

## 2017-10-11 NOTE — Procedures (Signed)
[place], [address]  DATE OF SERVICE:  September 26, 2017  Complete Pulmonary Function Testing Interpretation:  FINDINGS:   forced vital capacity is moderately decreased the FEV1 is 0.61 L which is 41% of predicted and is severely decreased.  Post bronchodilator there is no significant improvement in the FEV 1 however clinical improvement Wierzbicki occur in the absence of spirometric improvement.  The FEV1 FVC ratio is moderately decreased.  Total lung capacity is normal residual volume is increased residual volume total lung capacity ratio was increased all measured by body box.  The DLCO is severely reduced  IMPRESSION:   S pulmonary function study is consistent with severe obstructive lung disease.   There is no response to bronchodilators have a clinical improvement [place] still occurring in the absence of spirometric improved The DLCO is severely reduced Clinical correlation is recommended  Allyne Gee, MD [facility] Pulmonary Critical Care Medicine Sleep Medicine

## 2017-12-10 ENCOUNTER — Other Ambulatory Visit: Payer: Self-pay | Admitting: Internal Medicine

## 2017-12-17 ENCOUNTER — Ambulatory Visit (INDEPENDENT_AMBULATORY_CARE_PROVIDER_SITE_OTHER): Payer: PPO | Admitting: Nurse Practitioner

## 2017-12-17 ENCOUNTER — Encounter: Payer: Self-pay | Admitting: Nurse Practitioner

## 2017-12-17 VITALS — BP 142/60 | HR 69 | Resp 16 | Ht <= 58 in | Wt 132.4 lb

## 2017-12-17 DIAGNOSIS — R0602 Shortness of breath: Secondary | ICD-10-CM | POA: Diagnosis not present

## 2017-12-17 DIAGNOSIS — Z1231 Encounter for screening mammogram for malignant neoplasm of breast: Secondary | ICD-10-CM

## 2017-12-17 DIAGNOSIS — R300 Dysuria: Secondary | ICD-10-CM

## 2017-12-17 DIAGNOSIS — Z0001 Encounter for general adult medical examination with abnormal findings: Secondary | ICD-10-CM | POA: Diagnosis not present

## 2017-12-17 DIAGNOSIS — I10 Essential (primary) hypertension: Secondary | ICD-10-CM | POA: Diagnosis not present

## 2017-12-17 DIAGNOSIS — E039 Hypothyroidism, unspecified: Secondary | ICD-10-CM | POA: Diagnosis not present

## 2017-12-17 DIAGNOSIS — E559 Vitamin D deficiency, unspecified: Secondary | ICD-10-CM | POA: Diagnosis not present

## 2017-12-17 DIAGNOSIS — Z1239 Encounter for other screening for malignant neoplasm of breast: Secondary | ICD-10-CM

## 2017-12-17 MED ORDER — ALBUTEROL SULFATE 108 (90 BASE) MCG/ACT IN AEPB: 2.0000 | INHALATION_SPRAY | RESPIRATORY_TRACT | 5 refills | Status: DC | PRN

## 2017-12-17 MED ORDER — AMLODIPINE BESYLATE 5 MG PO TABS
5.0000 mg | ORAL_TABLET | Freq: Every day | ORAL | 4 refills | Status: DC
Start: 2017-12-17 — End: 2018-12-30

## 2017-12-17 MED ORDER — CARVEDILOL 12.5 MG PO TABS: 12.5000 mg | ORAL_TABLET | Freq: Two times a day (BID) | ORAL | 1 refills | Status: DC

## 2017-12-17 NOTE — Progress Notes (Signed)
[facility] [place], [place] [address]  Internal MEDICINE  Office Visit Note  Patient Name: Brenda Tucker  [id number]  [id number]  Date of Service: 12/18/2017   Pt is here for routine health maintenance examination  Chief Complaint  Patient presents with  . Annual Exam  . Hypertension     Hypertension  This is a chronic problem. The current episode started more than 1 year ago. The problem has been gradually improving since onset. Associated symptoms include malaise/fatigue and peripheral edema. Pertinent negatives include no chest pain, headaches, neck pain, palpitations or shortness of breath. Agents associated with hypertension include thyroid hormones. Risk factors for coronary artery disease include dyslipidemia. Past treatments include ACE inhibitors, calcium channel blockers and diuretics. The current treatment provides moderate improvement. Compliance problems include exercise.  Hypertensive end-organ damage includes kidney disease. Identifiable causes of hypertension include chronic renal disease.     Current Medication: Outpatient Encounter Medications as of 12/17/2017  Medication Sig  . albuterol (PROVENTIL) (2.5 MG/3ML) 0.083% nebulizer solution Take 2.5 mg by nebulization every 6 (six) hours as needed for wheezing or shortness of breath.  . Albuterol Sulfate (PROAIR RESPICLICK) 295 (90 Base) MCG/ACT AEPB Inhale 2 puffs into the lungs every 4 (four) hours as needed.  Marland Kitchen amLODipine (NORVASC) 5 MG tablet Take 1 tablet (5 mg total) by mouth daily.  Marland Kitchen aspirin EC 81 MG tablet Take 81 mg by mouth daily.  . carvedilol (COREG) 12.5 MG tablet Take 1 tablet (12.5 mg total) by mouth 2 (two) times daily with a meal.  . Cholecalciferol (VITAMIN D PO) Take by mouth.  . fluconazole (DIFLUCAN) 150 MG tablet Take 1 tablet po once. Caul repeat dose in 3 days as needed for persistent symptoms.  . Fluticasone-Salmeterol (ADVAIR) 250-50 MCG/DOSE AEPB Inhale 1 puff into  the lungs 2 (two) times daily.  . hydrochlorothiazide (HYDRODIURIL) 12.5 MG tablet Take 1 tablet (12.5 mg total) by mouth daily.  Marland Kitchen levothyroxine (SYNTHROID, LEVOTHROID) 50 MCG tablet Take 1 tablet (50 mcg total) by mouth daily before breakfast.  . losartan (COZAAR) 25 MG tablet Take 1 tablet (25 mg total) by mouth daily.  . Multiple Vitamins-Minerals (OCUVITE ADULT FORMULA PO) Take by mouth.  Marland Kitchen omeprazole (PRILOSEC) 40 MG capsule Take 1 capsule (40 mg total) by mouth daily.  . traMADol (ULTRAM) 50 MG tablet Take 1 tablet (50 mg total) by mouth daily.  . vitamin B-12 (CYANOCOBALAMIN) 1000 MCG tablet Take 1,000 mcg by mouth daily.  . [DISCONTINUED] amLODipine (NORVASC) 5 MG tablet Take 1 tablet (5 mg total) by mouth daily.  . [DISCONTINUED] carvedilol (COREG) 12.5 MG tablet Take 1 tablet (12.5 mg total) by mouth 2 (two) times daily with a meal.  . [DISCONTINUED] PROAIR HFA 108 (90 Base) MCG/ACT inhaler USE 2 PUFFS EVERY 6 HOURS AS NEEDED   No facility-administered encounter medications on file as of 12/17/2017.     Surgical History: Past Surgical History:  Procedure Laterality Date  . ABDOMINAL HYSTERECTOMY    . APPENDECTOMY    . COLONOSCOPY WITH PROPOFOL N/A 07/09/2015   Procedure: COLONOSCOPY WITH PROPOFOL;  Surgeon: Manya Silvas, MD;  Location: [facility] ENDOSCOPY;  Service: Endoscopy;  Laterality: N/A;    Medical History: Past Medical History:  Diagnosis Date  . Arthritis   . Asthma   . Chronic kidney disease   . COPD (chronic obstructive pulmonary disease) ([place])   . Hypertension   . Hypothyroidism     Family History: History reviewed. No  pertinent family history.    Review of Systems  Constitutional: Positive for malaise/fatigue. Negative for activity change, chills, fatigue and unexpected weight change.  HENT: Negative for congestion, postnasal drip, rhinorrhea, sneezing and sore throat.   Eyes: Negative.  Negative for redness.  Respiratory: Negative for cough, chest  tightness and shortness of breath.        Shortness of breath with exertion.  Cardiovascular: Negative for chest pain, palpitations and leg swelling.  Gastrointestinal: Negative for abdominal pain, constipation, diarrhea, nausea and vomiting.  Endocrine: Negative for cold intolerance, heat intolerance, polydipsia, polyphagia and polyuria.  Genitourinary: Negative for dysuria and frequency.  Musculoskeletal: Positive for arthralgias. Negative for back pain, joint swelling and neck pain.  Skin: Negative for rash.  Allergic/Immunologic: Positive for environmental allergies.  Neurological: Negative for dizziness, tremors, light-headedness, numbness and headaches.  Hematological: Negative for adenopathy. Does not bruise/bleed easily.  Psychiatric/Behavioral: Negative for behavioral problems (Depression), dysphoric mood, sleep disturbance and suicidal ideas. The patient is not nervous/anxious.      Today's Vitals   12/17/17 0901  BP: (!) 142/60  Pulse: 69  Resp: 16  SpO2: 94%  Weight: 132 lb 6.4 oz (60.1 kg)  Height: 4\' 8"  (1.422 m)    Physical Exam  Constitutional: She is oriented to person, place, and time. She appears well-developed and well-nourished. No distress.  HENT:  Head: Normocephalic and atraumatic.  Mouth/Throat: Oropharynx is clear and moist. No oropharyngeal exudate.  Eyes: Pupils are equal, round, and reactive to light. Conjunctivae and EOM are normal.  Neck: Normal range of motion. Neck supple. No JVD present. Carotid bruit is present. No tracheal deviation present. No thyromegaly present.  Cardiovascular: Normal rate, regular rhythm, normal heart sounds and intact distal pulses. Exam reveals no gallop and no friction rub.  No murmur heard. Pulmonary/Chest: Effort normal and breath sounds normal. No respiratory distress. She has no wheezes. She has no rales. She exhibits no tenderness. Right breast exhibits no inverted nipple, no mass, no nipple discharge, no skin change  and no tenderness. Left breast exhibits no inverted nipple, no mass, no nipple discharge, no skin change and no tenderness.  Abdominal: Soft. Bowel sounds are normal. There is no tenderness.  Musculoskeletal: Normal range of motion.  Lymphadenopathy:    She has no cervical adenopathy.  Neurological: She is alert and oriented to person, place, and time. No cranial nerve deficit.  Skin: Skin is warm and dry. She is not diaphoretic.  Psychiatric: She has a normal mood and affect. Her behavior is normal. Judgment and thought content normal.  Nursing note and vitals reviewed.    LABS: Recent Results (from the past 2160 hour(s))  Urinalysis, Routine w reflex microscopic     Status: Abnormal   Collection Time: 12/17/17  9:00 AM  Result Value Ref Range   Specific Gravity, UA 1.020 1.005 - 1.030   pH, UA 5.0 5.0 - 7.5   Color, UA Yellow Yellow   Appearance Ur Clear Clear   Leukocytes, UA 1+ (A) Negative   Protein, UA Negative Negative/Trace   Glucose, UA Negative Negative   Ketones, UA Negative Negative   RBC, UA Negative Negative   Bilirubin, UA Negative Negative   Urobilinogen, Ur 0.2 0.2 - 1.0 mg/dL   Nitrite, UA Negative Negative   Microscopic Examination See below:     Comment: Microscopic was indicated and was performed.  Microscopic Examination     Status: Abnormal   Collection Time: 12/17/17  9:00 AM  Result Value Ref  Range   WBC, UA 6-10 (A) 0 - 5 /hpf   RBC, UA 0-2 0 - 2 /hpf   Epithelial Cells (non renal) 0-10 0 - 10 /hpf   Casts None seen None seen /lpf   Mucus, UA Present Not Estab.   Bacteria, UA Few None seen/Few    Assessment/Plan: 1. Encounter for general adult medical examination with abnormal findings Annual health maintenance exam today. Routine, fasting labs ordered.  - Urinalysis, Routine w reflex microscopic - CBC with Differential/Platelet; Future - Comprehensive metabolic panel; Future  2. Hypertension, unspecified type Continue bp medication as  prescribed - CBC with Differential/Platelet; Future - Comprehensive metabolic panel; Future - Lipid panel; Future - carvedilol (COREG) 12.5 MG tablet; Take 1 tablet (12.5 mg total) by mouth 2 (two) times daily with a meal.  Dispense: 180 tablet; Refill: 1 - amLODipine (NORVASC) 5 MG tablet; Take 1 tablet (5 mg total) by mouth daily.  Dispense: 90 tablet; Refill: 4  3. SOB (shortness of breath) proair rescue inhaler [place] be used as needed and as prescribed  - CBC with Differential/Platelet; Future - Comprehensive metabolic panel; Future - Albuterol Sulfate (PROAIR RESPICLICK) 742 (90 Base) MCG/ACT AEPB; Inhale 2 puffs into the lungs every 4 (four) hours as needed.  Dispense: 1 each; Refill: 5  4. Hypothyroidism, unspecified type - T4, free; Future - TSH  5. Vitamin D deficiency - Vitamin D 1,25 dihydroxy; Future  6. Screening for breast cancer - MM DIGITAL SCREENING BILATERAL; Future  7. Dysuria - Urinalysis, Routine w reflex microscopic  General Counseling: Chetara verbalizes understanding of the findings of todays visit and agrees with plan of treatment. I have discussed any further diagnostic evaluation that Treece be needed or ordered today. We also reviewed her medications today. she has been encouraged to call the office with any questions or concerns that should arise related to todays visit.    Counseling:  This patient was seen by Leretha Pol, FNP- C in Collaboration with Dr Lavera Guise as a part of collaborative care agreement    Orders Placed This Encounter  Procedures  . Microscopic Examination  . MM DIGITAL SCREENING BILATERAL  . Urinalysis, Routine w reflex microscopic  . CBC with Differential/Platelet  . Comprehensive metabolic panel  . T4, free  . TSH  . Lipid panel  . Vitamin D 1,25 dihydroxy    Meds ordered this encounter  Medications  . carvedilol (COREG) 12.5 MG tablet    Sig: Take 1 tablet (12.5 mg total) by mouth 2 (two) times daily with a meal.     Dispense:  180 tablet    Refill:  1    Order Specific Question:   Supervising Provider    Answer:   Lavera Guise [place]  . amLODipine (NORVASC) 5 MG tablet    Sig: Take 1 tablet (5 mg total) by mouth daily.    Dispense:  90 tablet    Refill:  4    Order Specific Question:   Supervising Provider    Answer:   Lavera Guise [[id number]]  . Albuterol Sulfate (PROAIR RESPICLICK) 387 (90 Base) MCG/ACT AEPB    Sig: Inhale 2 puffs into the lungs every 4 (four) hours as needed.    Dispense:  1 each    Refill:  5    Order Specific Question:   Supervising Provider    Answer:   Lavera Guise [[id number]]    Time spent: [place]  Humphrey Rolls, MD  Internal Medicine

## 2017-12-18 ENCOUNTER — Telehealth: Payer: Self-pay

## 2017-12-18 DIAGNOSIS — E559 Vitamin D deficiency, unspecified: Secondary | ICD-10-CM | POA: Insufficient documentation

## 2017-12-18 DIAGNOSIS — Z1231 Encounter for screening mammogram for malignant neoplasm of breast: Secondary | ICD-10-CM | POA: Insufficient documentation

## 2017-12-18 DIAGNOSIS — Z0001 Encounter for general adult medical examination with abnormal findings: Secondary | ICD-10-CM | POA: Insufficient documentation

## 2017-12-18 DIAGNOSIS — Z1239 Encounter for other screening for malignant neoplasm of breast: Secondary | ICD-10-CM

## 2017-12-18 DIAGNOSIS — R300 Dysuria: Secondary | ICD-10-CM | POA: Insufficient documentation

## 2017-12-18 LAB — URINALYSIS, ROUTINE W REFLEX MICROSCOPIC
BILIRUBIN UA: NEGATIVE
GLUCOSE, UA: NEGATIVE
Ketones, UA: NEGATIVE
Nitrite, UA: NEGATIVE
PROTEIN UA: NEGATIVE
RBC, UA: NEGATIVE
Specific Gravity, UA: 1.02 (ref 1.005–1.030)
UUROB: 0.2 mg/dL (ref 0.2–1.0)
pH, UA: 5 (ref 5.0–7.5)

## 2017-12-18 LAB — MICROSCOPIC EXAMINATION: CASTS: NONE SEEN /LPF

## 2017-12-18 NOTE — Telephone Encounter (Signed)
Called patient to notify her that her mammogram order is in our system and to call [facility] to make an appt for a time and day that is the most convenient for her.

## 2017-12-20 ENCOUNTER — Ambulatory Visit: Payer: Self-pay | Admitting: Internal Medicine

## 2017-12-25 ENCOUNTER — Other Ambulatory Visit: Payer: Self-pay | Admitting: Nurse Practitioner

## 2017-12-25 DIAGNOSIS — I10 Essential (primary) hypertension: Secondary | ICD-10-CM | POA: Diagnosis not present

## 2017-12-25 DIAGNOSIS — E559 Vitamin D deficiency, unspecified: Secondary | ICD-10-CM | POA: Diagnosis not present

## 2017-12-25 DIAGNOSIS — Z0001 Encounter for general adult medical examination with abnormal findings: Secondary | ICD-10-CM | POA: Diagnosis not present

## 2017-12-25 DIAGNOSIS — E782 Mixed hyperlipidemia: Secondary | ICD-10-CM | POA: Diagnosis not present

## 2017-12-26 LAB — COMPREHENSIVE METABOLIC PANEL
A/G RATIO: 1.2 (ref 1.2–2.2)
ALT: 7 IU/L (ref 0–32)
AST: 16 IU/L (ref 0–40)
Albumin: 4 g/dL (ref 3.5–4.7)
Alkaline Phosphatase: 47 IU/L (ref 39–117)
BUN/Creatinine Ratio: 12 (ref 12–28)
BUN: 17 mg/dL (ref 8–27)
Bilirubin Total: 1 mg/dL (ref 0.0–1.2)
CHLORIDE: 104 mmol/L (ref 96–106)
CO2: 25 mmol/L (ref 20–29)
Calcium: 9.4 mg/dL (ref 8.7–10.3)
Creatinine, Ser: 1.42 mg/dL — ABNORMAL HIGH (ref 0.57–1.00)
GFR, EST AFRICAN AMERICAN: 40 mL/min/{1.73_m2} — AB (ref >59)
GFR, EST NON AFRICAN AMERICAN: 34 mL/min/{1.73_m2} — AB (ref >59)
Globulin, Total: 3.3 g/dL (ref 1.5–4.5)
Glucose: 118 mg/dL — ABNORMAL HIGH (ref 65–99)
POTASSIUM: 4.4 mmol/L (ref 3.5–5.2)
Sodium: 140 mmol/L (ref 134–144)
TOTAL PROTEIN: 7.3 g/dL (ref 6.0–8.5)

## 2017-12-26 LAB — VITAMIN D 25 HYDROXY (VIT D DEFICIENCY, FRACTURES): Vit D, 25-Hydroxy: 40.9 ng/mL (ref 30.0–100.0)

## 2017-12-26 LAB — T4, FREE: FREE T4: 1.4 ng/dL (ref 0.82–1.77)

## 2017-12-26 LAB — CBC
Hematocrit: 35.5 % (ref 34.0–46.6)
Hemoglobin: 11.9 g/dL (ref 11.1–15.9)
MCH: 33.1 pg — AB (ref 26.6–33.0)
MCHC: 33.5 g/dL (ref 31.5–35.7)
MCV: 99 fL — AB (ref 79–97)
PLATELETS: 260 10*3/uL (ref 150–450)
RBC: 3.59 x10E6/uL — ABNORMAL LOW (ref 3.77–5.28)
RDW: 13 % (ref 12.3–15.4)
WBC: 8.4 10*3/uL (ref 3.4–10.8)

## 2017-12-26 LAB — LIPID PANEL W/O CHOL/HDL RATIO
Cholesterol, Total: 209 mg/dL — ABNORMAL HIGH (ref 100–199)
HDL: 60 mg/dL (ref >39)
LDL Calculated: 119 mg/dL — ABNORMAL HIGH (ref 0–99)
Triglycerides: 152 mg/dL — ABNORMAL HIGH (ref 0–149)
VLDL Cholesterol Cal: 30 mg/dL (ref 5–40)

## 2017-12-26 LAB — TSH: TSH: 1.98 u[IU]/mL (ref 0.450–4.500)

## 2018-01-02 ENCOUNTER — Ambulatory Visit
Admission: RE | Admit: 2018-01-02 | Discharge: 2018-01-02 | Disposition: A | Discharge status: Home / Self Care | Payer: PPO | Source: Ambulatory Visit | Attending: Nurse Practitioner | Admitting: Nurse Practitioner

## 2018-01-02 DIAGNOSIS — Z1239 Encounter for other screening for malignant neoplasm of breast: Secondary | ICD-10-CM

## 2018-01-02 DIAGNOSIS — Z1231 Encounter for screening mammogram for malignant neoplasm of breast: Secondary | ICD-10-CM | POA: Diagnosis not present

## 2018-01-07 DIAGNOSIS — R809 Proteinuria, unspecified: Secondary | ICD-10-CM | POA: Diagnosis not present

## 2018-01-07 DIAGNOSIS — R319 Hematuria, unspecified: Secondary | ICD-10-CM | POA: Diagnosis not present

## 2018-01-07 DIAGNOSIS — I129 Hypertensive chronic kidney disease with stage 1 through stage 4 chronic kidney disease, or unspecified chronic kidney disease: Secondary | ICD-10-CM | POA: Diagnosis not present

## 2018-01-07 DIAGNOSIS — E559 Vitamin D deficiency, unspecified: Secondary | ICD-10-CM | POA: Diagnosis not present

## 2018-01-07 DIAGNOSIS — N390 Urinary tract infection, site not specified: Secondary | ICD-10-CM | POA: Diagnosis not present

## 2018-01-07 DIAGNOSIS — N2581 Secondary hyperparathyroidism of renal origin: Secondary | ICD-10-CM | POA: Diagnosis not present

## 2018-01-07 DIAGNOSIS — N183 Chronic kidney disease, stage 3 (moderate): Secondary | ICD-10-CM | POA: Diagnosis not present

## 2018-01-17 ENCOUNTER — Ambulatory Visit
Admission: RE | Admit: 2018-01-17 | Discharge: 2018-01-17 | Disposition: A | Discharge status: Home / Self Care | Payer: PPO | Source: Ambulatory Visit | Attending: Internal Medicine | Admitting: Internal Medicine

## 2018-01-17 ENCOUNTER — Ambulatory Visit
Admission: RE | Admit: 2018-01-17 | Discharge: 2018-01-17 | Disposition: A | Discharge status: Home / Self Care | Payer: PPO | Source: Ambulatory Visit | Attending: Adult Health | Admitting: Adult Health

## 2018-01-17 ENCOUNTER — Ambulatory Visit: Payer: PPO | Admitting: Internal Medicine

## 2018-01-17 ENCOUNTER — Encounter: Payer: Self-pay | Admitting: Internal Medicine

## 2018-01-17 VITALS — BP 140/78 | HR 62 | Resp 16 | Ht <= 58 in | Wt 130.2 lb

## 2018-01-17 DIAGNOSIS — K219 Gastro-esophageal reflux disease without esophagitis: Secondary | ICD-10-CM

## 2018-01-17 DIAGNOSIS — J449 Chronic obstructive pulmonary disease, unspecified: Secondary | ICD-10-CM

## 2018-01-17 DIAGNOSIS — I517 Cardiomegaly: Secondary | ICD-10-CM | POA: Diagnosis not present

## 2018-01-17 DIAGNOSIS — J9811 Atelectasis: Secondary | ICD-10-CM | POA: Insufficient documentation

## 2018-01-17 DIAGNOSIS — R0602 Shortness of breath: Secondary | ICD-10-CM | POA: Diagnosis not present

## 2018-01-17 DIAGNOSIS — J440 Chronic obstructive pulmonary disease with acute lower respiratory infection: Secondary | ICD-10-CM

## 2018-01-17 DIAGNOSIS — R05 Cough: Secondary | ICD-10-CM | POA: Diagnosis not present

## 2018-01-17 DIAGNOSIS — J209 Acute bronchitis, unspecified: Secondary | ICD-10-CM

## 2018-01-17 NOTE — Progress Notes (Signed)
[facility] [place], [place] [address]  Internal MEDICINE  Office Visit Note  Patient Name: Brenda Tucker  [id number]  [id number]  Date of Service: 01/17/2018  Chief Complaint  Patient presents with  . Asthma  . COPD    HPI Pt here for follow up on PFT.  She reports doing well.  She has been using her inhalers with no difficulty.  She reports using her Proair a couple times per day since the weather has been hot.  Overall she feels like she is doing well. She reports moderately thick white sputum recently.  She did note she had bronchitis a few weeks ago. She denies fever, or malaise.      Current Medication: Outpatient Encounter Medications as of 01/17/2018  Medication Sig  . albuterol (PROVENTIL) (2.5 MG/3ML) 0.083% nebulizer solution Take 2.5 mg by nebulization every 6 (six) hours as needed for wheezing or shortness of breath.  . Albuterol Sulfate (PROAIR RESPICLICK) 973 (90 Base) MCG/ACT AEPB Inhale 2 puffs into the lungs every 4 (four) hours as needed.  Marland Kitchen amLODipine (NORVASC) 5 MG tablet Take 1 tablet (5 mg total) by mouth daily.  Marland Kitchen aspirin EC 81 MG tablet Take 81 mg by mouth daily.  . carvedilol (COREG) 12.5 MG tablet Take 1 tablet (12.5 mg total) by mouth 2 (two) times daily with a meal.  . Cholecalciferol (VITAMIN D PO) Take by mouth.  . fluconazole (DIFLUCAN) 150 MG tablet Take 1 tablet po once. Cobin repeat dose in 3 days as needed for persistent symptoms.  . Fluticasone-Salmeterol (ADVAIR) 250-50 MCG/DOSE AEPB Inhale 1 puff into the lungs 2 (two) times daily.  . hydrochlorothiazide (HYDRODIURIL) 12.5 MG tablet Take 1 tablet (12.5 mg total) by mouth daily.  Marland Kitchen levothyroxine (SYNTHROID, LEVOTHROID) 50 MCG tablet Take 1 tablet (50 mcg total) by mouth daily before breakfast.  . losartan (COZAAR) 25 MG tablet Take 1 tablet (25 mg total) by mouth daily.  . Multiple Vitamins-Minerals (OCUVITE ADULT FORMULA PO) Take by mouth.  Marland Kitchen omeprazole (PRILOSEC) 40 MG  capsule Take 1 capsule (40 mg total) by mouth daily.  . traMADol (ULTRAM) 50 MG tablet Take 1 tablet (50 mg total) by mouth daily.  . vitamin B-12 (CYANOCOBALAMIN) 1000 MCG tablet Take 1,000 mcg by mouth daily.   No facility-administered encounter medications on file as of 01/17/2018.     Surgical History: Past Surgical History:  Procedure Laterality Date  . ABDOMINAL HYSTERECTOMY    . APPENDECTOMY    . COLONOSCOPY WITH PROPOFOL N/A 07/09/2015   Procedure: COLONOSCOPY WITH PROPOFOL;  Surgeon: Manya Silvas, MD;  Location: [facility] ENDOSCOPY;  Service: Endoscopy;  Laterality: N/A;    Medical History: Past Medical History:  Diagnosis Date  . Arthritis   . Asthma   . Chronic kidney disease   . COPD (chronic obstructive pulmonary disease) ([place])   . Hypertension   . Hypothyroidism     Family History: History reviewed. No pertinent family history.  Social History   Socioeconomic History  . Marital status: Widowed    Spouse name: Not on file  . Number of children: Not on file  . Years of education: Not on file  . Highest education level: Not on file  Occupational History  . Not on file  Social Needs  . Financial resource strain: Not on file  . Food insecurity:    Worry: Not on file    Inability: Not on file  . Transportation needs:    Medical:  Not on file    Non-medical: Not on file  Tobacco Use  . Smoking status: Never Smoker  . Smokeless tobacco: Never Used  Substance and Sexual Activity  . Alcohol use: No  . Drug use: No  . Sexual activity: Not on file  Lifestyle  . Physical activity:    Days per week: Not on file    Minutes per session: Not on file  . Stress: Not on file  Relationships  . Social connections:    Talks on phone: Not on file    Gets together: Not on file    Attends religious service: Not on file    Active member of club or organization: Not on file    Attends meetings of clubs or organizations: Not on file    Relationship status: Not on file   . Intimate partner violence:    Fear of current or ex partner: Not on file    Emotionally abused: Not on file    Physically abused: Not on file    Forced sexual activity: Not on file  Other Topics Concern  . Not on file  Social History Narrative  . Not on file      Review of Systems  Constitutional: Negative for chills, fatigue and unexpected weight change.  HENT: Negative for congestion, rhinorrhea, sneezing and sore throat.   Eyes: Negative for photophobia, pain and redness.  Respiratory: Negative for cough, chest tightness and shortness of breath.   Cardiovascular: Negative for chest pain and palpitations.  Gastrointestinal: Negative for abdominal pain, constipation, diarrhea, nausea and vomiting.  Endocrine: Negative.   Genitourinary: Negative for dysuria and frequency.  Musculoskeletal: Negative for arthralgias, back pain, joint swelling and neck pain.  Skin: Negative for rash.  Allergic/Immunologic: Negative.   Neurological: Negative for tremors and numbness.  Hematological: Negative for adenopathy. Does not bruise/bleed easily.  Psychiatric/Behavioral: Negative for behavioral problems and sleep disturbance. The patient is not nervous/anxious.     Vital Signs: BP 140/78   Pulse 62   Resp 16   Ht 4\' 8"  (1.422 m)   Wt 130 lb 3.2 oz (59.1 kg)   SpO2 96%   BMI 29.19 kg/m    Physical Exam  Constitutional: She is oriented to person, place, and time. She appears well-developed and well-nourished. No distress.  HENT:  Head: Normocephalic and atraumatic.  Mouth/Throat: Oropharynx is clear and moist. No oropharyngeal exudate.  Eyes: Pupils are equal, round, and reactive to light. EOM are normal.  Neck: Normal range of motion. Neck supple. No JVD present. No tracheal deviation present. No thyromegaly present.  Cardiovascular: Normal rate, regular rhythm and normal heart sounds. Exam reveals no gallop and no friction rub.  No murmur heard. Pulmonary/Chest: Effort normal  and breath sounds normal. No respiratory distress. She has no wheezes. She has no rales. She exhibits no tenderness.  Abdominal: Soft. There is no tenderness. There is no guarding.  Musculoskeletal: Normal range of motion.  Lymphadenopathy:    She has no cervical adenopathy.  Neurological: She is alert and oriented to person, place, and time. No cranial nerve deficit.  Skin: Skin is warm and dry. She is not diaphoretic.  Psychiatric: She has a normal mood and affect. Her behavior is normal. Judgment and thought content normal.  Nursing note and vitals reviewed.   Assessment/Plan: 1. Chronic obstructive pulmonary disease, unspecified COPD type ([place]) Continue current regimen.  Continue inhalers as directed.    - DG Chest 2 View; Future  2. Acute bronchitis with  COPD ([place]) - Spirometry with Graph  3. Gastroesophageal reflux disease without esophagitis Continues to take Tums as needed.  Pt was on Prilosec, however she stopped it after reading about side effects.   General Counseling: Leyani verbalizes understanding of the findings of todays visit and agrees with plan of treatment. I have discussed any further diagnostic evaluation that [place] be needed or ordered today. We also reviewed her medications today. she has been encouraged to call the office with any questions or concerns that should arise related to todays visit.    Orders Placed This Encounter  Procedures  . Spirometry with Graph    No orders of the defined types were placed in this encounter.   Time spent: 20 Minutes   This patient was seen by Orson Gear AGNP-C in Collaboration with Dr Devona Konig as a part of collaborative care agreement

## 2018-01-24 ENCOUNTER — Encounter: Payer: Self-pay | Admitting: Internal Medicine

## 2018-01-24 NOTE — Patient Instructions (Signed)

## 2018-01-25 ENCOUNTER — Other Ambulatory Visit: Payer: Self-pay | Admitting: Adult Health

## 2018-01-25 DIAGNOSIS — I517 Cardiomegaly: Secondary | ICD-10-CM

## 2018-01-25 NOTE — Progress Notes (Signed)
eho

## 2018-02-01 ENCOUNTER — Encounter: Payer: Self-pay | Admitting: Internal Medicine

## 2018-02-08 ENCOUNTER — Ambulatory Visit: Payer: PPO

## 2018-02-08 DIAGNOSIS — I517 Cardiomegaly: Secondary | ICD-10-CM

## 2018-02-15 ENCOUNTER — Telehealth: Payer: Self-pay | Admitting: Nurse Practitioner

## 2018-02-15 NOTE — Telephone Encounter (Signed)
Overall, the echo looks good. Normal size and function of the heart. Did show some mild/moderate calcification of the valves which we will continue o monitor.

## 2018-02-19 NOTE — Telephone Encounter (Signed)
Left message and advised patient of echo results

## 2018-02-26 DIAGNOSIS — H353132 Nonexudative age-related macular degeneration, bilateral, intermediate dry stage: Secondary | ICD-10-CM | POA: Diagnosis not present

## 2018-03-05 ENCOUNTER — Encounter: Payer: Self-pay | Admitting: Nurse Practitioner

## 2018-03-05 ENCOUNTER — Ambulatory Visit (INDEPENDENT_AMBULATORY_CARE_PROVIDER_SITE_OTHER): Payer: PPO | Admitting: Nurse Practitioner

## 2018-03-05 VITALS — BP 141/59 | HR 67 | Temp 98.6°F | Resp 16 | Ht <= 58 in | Wt 132.2 lb

## 2018-03-05 DIAGNOSIS — L03011 Cellulitis of right finger: Secondary | ICD-10-CM

## 2018-03-05 DIAGNOSIS — I10 Essential (primary) hypertension: Secondary | ICD-10-CM

## 2018-03-05 MED ORDER — CEPHALEXIN 500 MG PO CAPS: 500.0000 mg | ORAL_CAPSULE | Freq: Three times a day (TID) | ORAL | 0 refills | Status: DC

## 2018-03-05 MED ORDER — MUPIROCIN 2 % EX OINT: TOPICAL_OINTMENT | CUTANEOUS | 1 refills | Status: DC

## 2018-03-05 NOTE — Progress Notes (Signed)
[facility] [place], [place] [address]  Internal MEDICINE  Office Visit Note  Patient Name: Brenda Tucker  [id number]  [id number]  Date of Service: 03/13/2018   Pt is here for a sick visit.  Chief Complaint  Patient presents with  . Pain    right pointer finger swollen and warm to touch, noticed it on monday morning, pt states it hurts to bend.     The patient states that she woke up with pain, swelling, and redness of the index finger of her right hand. No lesion, cut, bite, or abrasion noted by the patient. She feels ok otherwise. No fever, headache, or other negative symptoms present. Hurts more when she tries to bend her finger or grip anything.        Current Medication:  Outpatient Encounter Medications as of 03/05/2018  Medication Sig  . albuterol (PROVENTIL) (2.5 MG/3ML) 0.083% nebulizer solution Take 2.5 mg by nebulization every 6 (six) hours as needed for wheezing or shortness of breath.  . Albuterol Sulfate (PROAIR RESPICLICK) 097 (90 Base) MCG/ACT AEPB Inhale 2 puffs into the lungs every 4 (four) hours as needed.  Marland Kitchen amLODipine (NORVASC) 5 MG tablet Take 1 tablet (5 mg total) by mouth daily.  Marland Kitchen aspirin EC 81 MG tablet Take 81 mg by mouth daily.  . carvedilol (COREG) 12.5 MG tablet Take 1 tablet (12.5 mg total) by mouth 2 (two) times daily with a meal.  . Cholecalciferol (VITAMIN D PO) Take by mouth.  . fluconazole (DIFLUCAN) 150 MG tablet Take 1 tablet po once. Goren repeat dose in 3 days as needed for persistent symptoms.  . Fluticasone-Salmeterol (ADVAIR) 250-50 MCG/DOSE AEPB Inhale 1 puff into the lungs 2 (two) times daily.  . hydrochlorothiazide (HYDRODIURIL) 12.5 MG tablet Take 1 tablet (12.5 mg total) by mouth daily.  Marland Kitchen levothyroxine (SYNTHROID, LEVOTHROID) 50 MCG tablet Take 1 tablet (50 mcg total) by mouth daily before breakfast.  . losartan (COZAAR) 25 MG tablet Take 1 tablet (25 mg total) by mouth daily.  . Multiple Vitamins-Minerals  (OCUVITE ADULT FORMULA PO) Take by mouth.  Marland Kitchen omeprazole (PRILOSEC) 40 MG capsule Take 1 capsule (40 mg total) by mouth daily.  . traMADol (ULTRAM) 50 MG tablet Take 1 tablet (50 mg total) by mouth daily.  . vitamin B-12 (CYANOCOBALAMIN) 1000 MCG tablet Take 1,000 mcg by mouth daily.  . cephALEXin (KEFLEX) 500 MG capsule Take 1 capsule (500 mg total) by mouth 3 (three) times daily.  . mupirocin ointment (BACTROBAN) 2 % Apply to affected area bid for 10 days   No facility-administered encounter medications on file as of 03/05/2018.       Medical History: Past Medical History:  Diagnosis Date  . Arthritis   . Asthma   . Chronic kidney disease   . COPD (chronic obstructive pulmonary disease) ([place])   . Hypertension   . Hypothyroidism      Today's Vitals   03/05/18 1127  BP: (!) 141/59  Pulse: 67  Resp: 16  Temp: 98.6 F (37 C)  SpO2: 95%  Weight: 132 lb 3.2 oz (60 kg)  Height: 4\' 8"  (1.422 m)    Review of Systems  Constitutional: Negative for activity change, chills, fatigue and unexpected weight change.  HENT: Negative for congestion, postnasal drip, rhinorrhea, sneezing and sore throat.   Eyes: Negative.  Negative for redness.  Respiratory: Negative for cough, chest tightness and shortness of breath.        Shortness of breath  with exertion.  Cardiovascular: Negative for chest pain, palpitations and leg swelling.  Gastrointestinal: Negative for abdominal pain, constipation, diarrhea, nausea and vomiting.  Endocrine: Negative for cold intolerance, heat intolerance, polydipsia, polyphagia and polyuria.  Genitourinary: Negative for dysuria and frequency.  Musculoskeletal: Positive for arthralgias. Negative for back pain, joint swelling and neck pain.  Skin: Negative for rash.       Redness, pain, and swelling of the index finger of the right hand. Hurts more when bending the finger or trying to grip anything in the right hand.  Allergic/Immunologic: Positive for environmental  allergies.  Neurological: Negative for dizziness, tremors, light-headedness, numbness and headaches.  Hematological: Negative for adenopathy. Does not bruise/bleed easily.  Psychiatric/Behavioral: Negative for behavioral problems (Depression), dysphoric mood, sleep disturbance and suicidal ideas. The patient is not nervous/anxious.     Physical Exam  Constitutional: She is oriented to person, place, and time. She appears well-developed and well-nourished. No distress.  HENT:  Head: Normocephalic and atraumatic.  Mouth/Throat: Oropharynx is clear and moist. No oropharyngeal exudate.  Eyes: Pupils are equal, round, and reactive to light. Conjunctivae and EOM are normal.  Neck: Normal range of motion. Neck supple. No JVD present. Carotid bruit is present. No tracheal deviation present. No thyromegaly present.  Cardiovascular: Normal rate, regular rhythm, normal heart sounds and intact distal pulses. Exam reveals no gallop and no friction rub.  No murmur heard. Pulmonary/Chest: Effort normal and breath sounds normal. No respiratory distress. She has no wheezes. She has no rales. She exhibits no tenderness. Right breast exhibits no inverted nipple, no mass, no nipple discharge, no skin change and no tenderness. Left breast exhibits no inverted nipple, no mass, no nipple discharge, no skin change and no tenderness.  Abdominal: Soft. Bowel sounds are normal. There is no tenderness.  Musculoskeletal: Normal range of motion.  Lymphadenopathy:    She has no cervical adenopathy.  Neurological: She is alert and oriented to person, place, and time. No cranial nerve deficit.  Skin: Skin is warm and dry. She is not diaphoretic.     Psychiatric: She has a normal mood and affect. Her behavior is normal. Judgment and thought content normal.  Nursing note and vitals reviewed.  Assessment/Plan: 1. Cellulitis of finger of right hand Will start keflex 500mg  three times daily for 10 days. Also added bactroban  ointment. Apply small amount on affected family twice daily while on antibiotics. Contact the office if symptoms worsen over the next few days.  - cephALEXin (KEFLEX) 500 MG capsule; Take 1 capsule (500 mg total) by mouth 3 (three) times daily.  Dispense: 30 capsule; Refill: 0 - mupirocin ointment (BACTROBAN) 2 %; Apply to affected area bid for 10 days  Dispense: 22 g; Refill: 1  2. Hypertension, unspecified type Continue bp medication as prescribed   General Counseling: Brenda Tucker verbalizes understanding of the findings of todays visit and agrees with plan of treatment. I have discussed any further diagnostic evaluation that Brenda Tucker be needed or ordered today. We also reviewed her medications today. she has been encouraged to call the office with any questions or concerns that should arise related to todays visit.    Counseling:  This patient was seen by [place] with Dr Lavera Guise as a part of collaborative care agreement    Meds ordered this encounter  Medications  . cephALEXin (KEFLEX) 500 MG capsule    Sig: Take 1 capsule (500 mg total) by mouth 3 (three) times daily.    Dispense:  30 capsule    Refill:  0    Order Specific Question:   Supervising Provider    Answer:   Lavera Guise [[id number]]  . mupirocin ointment (BACTROBAN) 2 %    Sig: Apply to affected area bid for 10 days    Dispense:  22 g    Refill:  1    Order Specific Question:   Supervising Provider    Answer:   Lavera Guise [[id number]]    Time spent: 15 Minutes

## 2018-03-13 DIAGNOSIS — L03011 Cellulitis of right finger: Secondary | ICD-10-CM | POA: Insufficient documentation

## 2018-03-26 DIAGNOSIS — H40013 Open angle with borderline findings, low risk, bilateral: Secondary | ICD-10-CM | POA: Diagnosis not present

## 2018-03-26 DIAGNOSIS — H353132 Nonexudative age-related macular degeneration, bilateral, intermediate dry stage: Secondary | ICD-10-CM | POA: Diagnosis not present

## 2018-04-11 ENCOUNTER — Other Ambulatory Visit: Payer: Self-pay | Admitting: Internal Medicine

## 2018-06-18 ENCOUNTER — Ambulatory Visit (INDEPENDENT_AMBULATORY_CARE_PROVIDER_SITE_OTHER): Payer: PPO | Admitting: Nurse Practitioner

## 2018-06-18 ENCOUNTER — Encounter: Payer: Self-pay | Admitting: Nurse Practitioner

## 2018-06-18 VITALS — BP 154/68 | HR 62 | Resp 16 | Ht <= 58 in | Wt 133.6 lb

## 2018-06-18 DIAGNOSIS — J449 Chronic obstructive pulmonary disease, unspecified: Secondary | ICD-10-CM | POA: Diagnosis not present

## 2018-06-18 DIAGNOSIS — I6523 Occlusion and stenosis of bilateral carotid arteries: Secondary | ICD-10-CM | POA: Diagnosis not present

## 2018-06-18 DIAGNOSIS — I10 Essential (primary) hypertension: Secondary | ICD-10-CM

## 2018-06-18 DIAGNOSIS — E039 Hypothyroidism, unspecified: Secondary | ICD-10-CM | POA: Diagnosis not present

## 2018-06-18 MED ORDER — FLUTICASONE-SALMETEROL 250-50 MCG/DOSE IN AEPB: 1.0000 | INHALATION_SPRAY | Freq: Two times a day (BID) | RESPIRATORY_TRACT | 3 refills | Status: DC

## 2018-06-18 MED ORDER — CARVEDILOL 12.5 MG PO TABS: 12.5000 mg | ORAL_TABLET | Freq: Two times a day (BID) | ORAL | 1 refills | Status: DC

## 2018-06-18 MED ORDER — LEVOTHYROXINE SODIUM 50 MCG PO TABS: 50.0000 ug | ORAL_TABLET | Freq: Every day | ORAL | 3 refills | Status: DC

## 2018-06-18 NOTE — Progress Notes (Signed)
[facility] [place], [place] [address]  Internal MEDICINE  Office Visit Note  Patient Name: Brenda Tucker  [id number]  [id number]  Date of Service: 06/19/2018  Chief Complaint  Patient presents with  . Medical Management of Chronic Issues    6 month follow up  . Hypertension    Hypertension  This is a chronic problem. The current episode started more than 1 year ago. The problem has been gradually improving since onset. Associated symptoms include malaise/fatigue, peripheral edema and shortness of breath. Pertinent negatives include no chest pain, headaches, neck pain or palpitations. Agents associated with hypertension include thyroid hormones. Risk factors for coronary artery disease include dyslipidemia. Past treatments include ACE inhibitors, calcium channel blockers and diuretics. The current treatment provides moderate improvement. Compliance problems include exercise.  Hypertensive end-organ damage includes kidney disease. Identifiable causes of hypertension include chronic renal disease.       Current Medication: Outpatient Encounter Medications as of 06/18/2018  Medication Sig  . albuterol (PROVENTIL) (2.5 MG/3ML) 0.083% nebulizer solution Take 2.5 mg by nebulization every 6 (six) hours as needed for wheezing or shortness of breath.  . Albuterol Sulfate (PROAIR RESPICLICK) 270 (90 Base) MCG/ACT AEPB Inhale 2 puffs into the lungs every 4 (four) hours as needed.  Marland Kitchen amLODipine (NORVASC) 5 MG tablet Take 1 tablet (5 mg total) by mouth daily.  Marland Kitchen aspirin EC 81 MG tablet Take 81 mg by mouth daily.  . carvedilol (COREG) 12.5 MG tablet Take 1 tablet (12.5 mg total) by mouth 2 (two) times daily with a meal.  . cephALEXin (KEFLEX) 500 MG capsule Take 1 capsule (500 mg total) by mouth 3 (three) times daily.  . Cholecalciferol (VITAMIN D PO) Take by mouth.  . fluconazole (DIFLUCAN) 150 MG tablet Take 1 tablet po once. [place] repeat dose in 3 days as needed for  persistent symptoms.  . Fluticasone-Salmeterol (ADVAIR DISKUS) 250-50 MCG/DOSE AEPB Inhale 1 puff into the lungs 2 (two) times daily.  . hydrochlorothiazide (HYDRODIURIL) 12.5 MG tablet Take 1 tablet (12.5 mg total) by mouth daily.  Marland Kitchen levothyroxine (SYNTHROID, LEVOTHROID) 50 MCG tablet Take 1 tablet (50 mcg total) by mouth daily before breakfast.  . losartan (COZAAR) 25 MG tablet Take 1 tablet (25 mg total) by mouth daily.  . Multiple Vitamins-Minerals (OCUVITE ADULT FORMULA PO) Take by mouth.  . mupirocin ointment (BACTROBAN) 2 % Apply to affected area bid for 10 days  . omeprazole (PRILOSEC) 40 MG capsule Take 1 capsule (40 mg total) by mouth daily.  . traMADol (ULTRAM) 50 MG tablet Take 1 tablet (50 mg total) by mouth daily.  . vitamin B-12 (CYANOCOBALAMIN) 1000 MCG tablet Take 1,000 mcg by mouth daily.  . [DISCONTINUED] ADVAIR DISKUS 250-50 MCG/DOSE AEPB Inhale 1 puff into the lungs 2 (two) times daily.  . [DISCONTINUED] carvedilol (COREG) 12.5 MG tablet Take 1 tablet (12.5 mg total) by mouth 2 (two) times daily with a meal.  . [DISCONTINUED] levothyroxine (SYNTHROID, LEVOTHROID) 50 MCG tablet Take 1 tablet (50 mcg total) by mouth daily before breakfast.   No facility-administered encounter medications on file as of 06/18/2018.     Surgical History: Past Surgical History:  Procedure Laterality Date  . ABDOMINAL HYSTERECTOMY    . APPENDECTOMY    . COLONOSCOPY WITH PROPOFOL N/A 07/09/2015   Procedure: COLONOSCOPY WITH PROPOFOL;  Surgeon: Manya Silvas, MD;  Location: [facility] ENDOSCOPY;  Service: Endoscopy;  Laterality: N/A;    Medical History: Past Medical History:  Diagnosis Date  .  Arthritis   . Asthma   . Chronic kidney disease   . COPD (chronic obstructive pulmonary disease) ([place])   . Hypertension   . Hypothyroidism     Family History: Family History  Family history unknown: Yes    Social History   Socioeconomic History  . Marital status: Widowed    Spouse name: Not  on file  . Number of children: Not on file  . Years of education: Not on file  . Highest education level: Not on file  Occupational History  . Not on file  Social Needs  . Financial resource strain: Not on file  . Food insecurity:    Worry: Not on file    Inability: Not on file  . Transportation needs:    Medical: Not on file    Non-medical: Not on file  Tobacco Use  . Smoking status: Never Smoker  . Smokeless tobacco: Never Used  Substance and Sexual Activity  . Alcohol use: No  . Drug use: No  . Sexual activity: Not on file  Lifestyle  . Physical activity:    Days per week: Not on file    Minutes per session: Not on file  . Stress: Not on file  Relationships  . Social connections:    Talks on phone: Not on file    Gets together: Not on file    Attends religious service: Not on file    Active member of club or organization: Not on file    Attends meetings of clubs or organizations: Not on file    Relationship status: Not on file  . Intimate partner violence:    Fear of current or ex partner: Not on file    Emotionally abused: Not on file    Physically abused: Not on file    Forced sexual activity: Not on file  Other Topics Concern  . Not on file  Social History Narrative  . Not on file      Review of Systems  Constitutional: Positive for fatigue and malaise/fatigue. Negative for activity change, chills and unexpected weight change.  HENT: Negative for congestion, postnasal drip, rhinorrhea, sneezing and sore throat.   Eyes: Negative.   Respiratory: Positive for shortness of breath. Negative for cough and chest tightness.        Shortness of breath with exertion.  Cardiovascular: Positive for leg swelling. Negative for chest pain and palpitations.  Gastrointestinal: Negative for abdominal pain, constipation, diarrhea, nausea and vomiting.  Endocrine: Negative for cold intolerance, heat intolerance, polydipsia, polyphagia and polyuria.  Musculoskeletal: Positive  for arthralgias. Negative for back pain, joint swelling and neck pain.  Skin: Negative for rash.  Allergic/Immunologic: Positive for environmental allergies.  Neurological: Negative for dizziness, tremors, light-headedness, numbness and headaches.  Hematological: Negative for adenopathy. Does not bruise/bleed easily.  Psychiatric/Behavioral: Negative for behavioral problems (Depression), dysphoric mood, sleep disturbance and suicidal ideas. The patient is not nervous/anxious.    Today's Vitals   06/18/18 1022  BP: (!) 154/68  Pulse: 62  Resp: 16  SpO2: 93%  Weight: 133 lb 9.6 oz (60.6 kg)  Height: 4\' 8"  (1.422 m)    Physical Exam Vitals signs and nursing note reviewed.  Constitutional:      General: She is not in acute distress.    Appearance: Normal appearance. She is well-developed. She is not diaphoretic.  HENT:     Head: Normocephalic and atraumatic.     Nose: Nose normal.     Mouth/Throat:     Pharynx:  No oropharyngeal exudate.  Eyes:     Extraocular Movements: Extraocular movements intact.     Conjunctiva/sclera: Conjunctivae normal.     Pupils: Pupils are equal, round, and reactive to light.  Neck:     Musculoskeletal: Normal range of motion and neck supple.     Thyroid: No thyromegaly.     Vascular: Carotid bruit present. No JVD.     Trachea: No tracheal deviation.  Cardiovascular:     Rate and Rhythm: Normal rate and regular rhythm.     Heart sounds: Normal heart sounds. No murmur. No friction rub. No gallop.   Pulmonary:     Effort: Pulmonary effort is normal. No respiratory distress.     Breath sounds: Normal breath sounds. No wheezing or rales.  Chest:     Chest wall: No tenderness.     Breasts:        Right: No inverted nipple, mass, nipple discharge, skin change or tenderness.        Left: No inverted nipple, mass, nipple discharge, skin change or tenderness.  Abdominal:     General: Bowel sounds are normal.     Palpations: Abdomen is soft.      Tenderness: There is no abdominal tenderness.  Musculoskeletal: Normal range of motion.  Lymphadenopathy:     Cervical: No cervical adenopathy.  Skin:    General: Skin is warm and dry.  Neurological:     General: No focal deficit present.     Mental Status: She is alert and oriented to person, place, and time.     Cranial Nerves: No cranial nerve deficit.  Psychiatric:        Mood and Affect: Mood normal.        Behavior: Behavior normal.        Thought Content: Thought content normal.        Judgment: Judgment normal.    Assessment/Plan: 1. Hypertension, unspecified type bp stable. Continue meds as prescribed.  - carvedilol (COREG) 12.5 MG tablet; Take 1 tablet (12.5 mg total) by mouth 2 (two) times daily with a meal.  Dispense: 180 tablet; Refill: 1  2. Chronic obstructive pulmonary disease, unspecified COPD type ([place]) Use advair twice daily. Balint use rescue inhaler as needed and as prescribed.  - Fluticasone-Salmeterol (ADVAIR DISKUS) 250-50 MCG/DOSE AEPB; Inhale 1 puff into the lungs 2 (two) times daily.  Dispense: 3 each; Refill: 3  3. Hypothyroidism, unspecified type Thyroid panel stable. Continue levothyroxine as prescribed  - levothyroxine (SYNTHROID, LEVOTHROID) 50 MCG tablet; Take 1 tablet (50 mcg total) by mouth daily before breakfast.  Dispense: 90 tablet; Refill: 3  4. Bilateral carotid artery stenosis Will get new carotid doppler study for monitoring. Refer to vein and vascular as indicated.  - US Carotid Duplex Bilateral; Future  General Counseling: Louna verbalizes understanding of the findings of todays visit and agrees with plan of treatment. I have discussed any further diagnostic evaluation that [place] be needed or ordered today. We also reviewed her medications today. she has been encouraged to call the office with any questions or concerns that should arise related to todays visit.  Hypertension Counseling:   The following hypertensive lifestyle modification  were recommended and discussed:  1. Limiting alcohol intake to less than 1 oz/day of ethanol:(24 oz of beer or 8 oz of wine or 2 oz of 100-proof whiskey). 2. Take baby ASA 81 mg daily. 3. Importance of regular aerobic exercise and losing weight. 4. Reduce dietary saturated fat and cholesterol intake for  overall cardiovascular health. 5. Maintaining adequate dietary potassium, calcium, and magnesium intake. 6. Regular monitoring of the blood pressure. 7. Reduce sodium intake to less than 100 mmol/day (less than 2.3 gm of sodium or less than 6 gm of sodium choride)   Cardiac risk factor modification:  1. Control blood pressure. 2. Exercise as prescribed. 3. Follow low sodium, low fat diet. and low fat and low cholestrol diet. 4. Take ASA 81mg  once a day. 5. Restricted calories diet to lose weight.  This patient was seen by Leretha Pol FNP Collaboration with Dr Lavera Guise as a part of collaborative care agreement  Orders Placed This Encounter  Procedures  . US Carotid Duplex Bilateral    Meds ordered this encounter  Medications  . Fluticasone-Salmeterol (ADVAIR DISKUS) 250-50 MCG/DOSE AEPB    Sig: Inhale 1 puff into the lungs 2 (two) times daily.    Dispense:  3 each    Refill:  3    Patient will need this in January.    Order Specific Question:   Supervising Provider    Answer:   Lavera Guise [[id number]]  . levothyroxine (SYNTHROID, LEVOTHROID) 50 MCG tablet    Sig: Take 1 tablet (50 mcg total) by mouth daily before breakfast.    Dispense:  90 tablet    Refill:  3    Prescription will be needed in January    Order Specific Question:   Supervising Provider    Answer:   Lavera Guise [place]  . carvedilol (COREG) 12.5 MG tablet    Sig: Take 1 tablet (12.5 mg total) by mouth 2 (two) times daily with a meal.    Dispense:  180 tablet    Refill:  1    Patient will need refill in January    Order Specific Question:   Supervising Provider    Answer:   Lavera Guise [[id number]]    Time  spent: 25 Minutes      Dr Lavera Guise Internal medicine

## 2018-06-19 DIAGNOSIS — I6523 Occlusion and stenosis of bilateral carotid arteries: Secondary | ICD-10-CM | POA: Insufficient documentation

## 2018-07-04 DIAGNOSIS — N390 Urinary tract infection, site not specified: Secondary | ICD-10-CM | POA: Diagnosis not present

## 2018-07-04 DIAGNOSIS — N281 Cyst of kidney, acquired: Secondary | ICD-10-CM | POA: Diagnosis not present

## 2018-07-04 DIAGNOSIS — N183 Chronic kidney disease, stage 3 (moderate): Secondary | ICD-10-CM | POA: Diagnosis not present

## 2018-07-04 DIAGNOSIS — I10 Essential (primary) hypertension: Secondary | ICD-10-CM | POA: Diagnosis not present

## 2018-07-08 DIAGNOSIS — I129 Hypertensive chronic kidney disease with stage 1 through stage 4 chronic kidney disease, or unspecified chronic kidney disease: Secondary | ICD-10-CM | POA: Diagnosis not present

## 2018-07-08 DIAGNOSIS — N183 Chronic kidney disease, stage 3 (moderate): Secondary | ICD-10-CM | POA: Diagnosis not present

## 2018-07-08 DIAGNOSIS — R809 Proteinuria, unspecified: Secondary | ICD-10-CM | POA: Diagnosis not present

## 2018-07-08 DIAGNOSIS — R319 Hematuria, unspecified: Secondary | ICD-10-CM | POA: Diagnosis not present

## 2018-08-01 ENCOUNTER — Ambulatory Visit: Payer: PPO | Admitting: Internal Medicine

## 2018-08-01 ENCOUNTER — Encounter: Payer: Self-pay | Admitting: Internal Medicine

## 2018-08-01 VITALS — BP 122/80 | HR 65 | Resp 16 | Ht <= 58 in | Wt 133.0 lb

## 2018-08-01 DIAGNOSIS — R0602 Shortness of breath: Secondary | ICD-10-CM | POA: Diagnosis not present

## 2018-08-01 DIAGNOSIS — K219 Gastro-esophageal reflux disease without esophagitis: Secondary | ICD-10-CM | POA: Diagnosis not present

## 2018-08-01 DIAGNOSIS — I10 Essential (primary) hypertension: Secondary | ICD-10-CM

## 2018-08-01 DIAGNOSIS — J449 Chronic obstructive pulmonary disease, unspecified: Secondary | ICD-10-CM | POA: Diagnosis not present

## 2018-08-01 NOTE — Patient Instructions (Signed)
Chronic Obstructive Pulmonary Disease Chronic obstructive pulmonary disease (COPD) is a long-term (chronic) lung problem. When you have COPD, it is hard for air to get in and out of your lungs. Usually the condition gets worse over time, and your lungs will never return to normal. There are things you can do to keep yourself as healthy as possible.  Your doctor [place] treat your condition with: ? Medicines. ? Oxygen. ? Lung surgery.  Your doctor [place] also recommend: ? Rehabilitation. This includes steps to make your body work better. It [place] involve a team of specialists. ? Quitting smoking, if you smoke. ? Exercise and changes to your diet. ? Comfort measures (palliative care). Follow these instructions at home: Medicines  Take over-the-counter and prescription medicines only as told by your doctor.  Talk to your doctor before taking any cough or allergy medicines. You [place] need to avoid medicines that cause your lungs to be dry. Lifestyle  If you smoke, stop. Smoking makes the problem worse. If you need help quitting, ask your doctor.  Avoid being around things that make your breathing worse. This [place] include smoke, chemicals, and fumes.  Stay active, but remember to rest as well.  Learn and use tips on how to relax.  Make sure you get enough sleep. Most adults need at least 7 hours of sleep every night.  Eat healthy foods. Eat smaller meals more often. Rest before meals. Controlled breathing Learn and use tips on how to control your breathing as told by your doctor. Try:  Breathing in (inhaling) through your nose for 1 second. Then, pucker your lips and breath out (exhale) through your lips for 2 seconds.  Putting one hand on your belly (abdomen). Breathe in slowly through your nose for 1 second. Your hand on your belly should move out. Pucker your lips and breathe out slowly through your lips. Your hand on your belly should move in as you breathe out.  Controlled coughing Learn  and use controlled coughing to clear mucus from your lungs. Follow these steps: 1. Lean your head a little forward. 2. Breathe in deeply. 3. Try to hold your breath for 3 seconds. 4. Keep your mouth slightly open while coughing 2 times. 5. Spit any mucus out into a tissue. 6. Rest and do the steps again 1 or 2 times as needed. General instructions  Make sure you get all the shots (vaccines) that your doctor recommends. Ask your doctor about a flu shot and a pneumonia shot.  Use oxygen therapy and pulmonary rehabilitation if told by your doctor. If you need home oxygen therapy, ask your doctor if you should buy a tool to measure your oxygen level (oximeter).  Make a COPD action plan with your doctor. This helps you to know what to do if you feel worse than usual.  Manage any other conditions you have as told by your doctor.  Avoid going outside when it is very hot, cold, or humid.  Avoid people who have a sickness you can catch (contagious).  Keep all follow-up visits as told by your doctor. This is important. Contact a doctor if:  You cough up more mucus than usual.  There is a change in the color or thickness of the mucus.  It is harder to breathe than usual.  Your breathing is faster than usual.  You have trouble sleeping.  You need to use your medicines more often than usual.  You have trouble doing your normal activities such as getting dressed   or walking around the house. Get help right away if:  You have shortness of breath while resting.  You have shortness of breath that stops you from: ? Being able to talk. ? Doing normal activities.  Your chest hurts for longer than 5 minutes.  Your skin color is more blue than usual.  Your pulse oximeter shows that you have low oxygen for longer than 5 minutes.  You have a fever.  You feel too tired to breathe normally. Summary  Chronic obstructive pulmonary disease (COPD) is a long-term lung problem.  The way your  lungs work will never return to normal. Usually the condition gets worse over time. There are things you can do to keep yourself as healthy as possible.  Take over-the-counter and prescription medicines only as told by your doctor.  If you smoke, stop. Smoking makes the problem worse. This information is not intended to replace advice given to you by your health care provider. Make sure you discuss any questions you have with your health care provider. Document Released: 12/06/2007 Document Revised: 07/24/2016 Document Reviewed: 07/24/2016 Elsevier Interactive Patient Education  2019 [employer]  

## 2018-08-01 NOTE — Progress Notes (Signed)
[facility] [place], [place] [address]  Pulmonary Sleep Medicine   Office Visit Note  Patient Name: Brenda Tucker DOB: Dec 09, 1933 MRN [id number]  Date of Service: 08/11/2018  Complaints/HPI: Pt here for follow up.  She is doing well at this time. She reports having bronchitis last month.  She continues to use her inhalers without difficulty.  Overall she reports that she is doing well and denies any current need.  ROS  General: (-) fever, (-) chills, (-) night sweats, (-) weakness Skin: (-) rashes, (-) itching,. Eyes: (-) visual changes, (-) redness, (-) itching. Nose and Sinuses: (-) nasal stuffiness or itchiness, (-) postnasal drip, (-) nosebleeds, (-) sinus trouble. Mouth and Throat: (-) sore throat, (-) hoarseness. Neck: (-) swollen glands, (-) enlarged thyroid, (-) neck pain. Respiratory: - cough, (-) bloody sputum, - shortness of breath, - wheezing. Cardiovascular: - ankle swelling, (-) chest pain. Lymphatic: (-) lymph node enlargement. Neurologic: (-) numbness, (-) tingling. Psychiatric: (-) anxiety, (-) depression   Current Medication: Outpatient Encounter Medications as of 08/01/2018  Medication Sig  . albuterol (PROVENTIL) (2.5 MG/3ML) 0.083% nebulizer solution Take 2.5 mg by nebulization every 6 (six) hours as needed for wheezing or shortness of breath.  . Albuterol Sulfate (PROAIR RESPICLICK) 010 (90 Base) MCG/ACT AEPB Inhale 2 puffs into the lungs every 4 (four) hours as needed.  Marland Kitchen amLODipine (NORVASC) 5 MG tablet Take 1 tablet (5 mg total) by mouth daily.  Marland Kitchen aspirin EC 81 MG tablet Take 81 mg by mouth daily.  . carvedilol (COREG) 12.5 MG tablet Take 1 tablet (12.5 mg total) by mouth 2 (two) times daily with a meal.  . cephALEXin (KEFLEX) 500 MG capsule Take 1 capsule (500 mg total) by mouth 3 (three) times daily.  . Cholecalciferol (VITAMIN D PO) Take by mouth.  . fluconazole (DIFLUCAN) 150 MG tablet Take 1 tablet po once. [place] repeat dose in  3 days as needed for persistent symptoms.  . Fluticasone-Salmeterol (ADVAIR DISKUS) 250-50 MCG/DOSE AEPB Inhale 1 puff into the lungs 2 (two) times daily.  . hydrochlorothiazide (HYDRODIURIL) 12.5 MG tablet Take 1 tablet (12.5 mg total) by mouth daily.  Marland Kitchen levothyroxine (SYNTHROID, LEVOTHROID) 50 MCG tablet Take 1 tablet (50 mcg total) by mouth daily before breakfast.  . losartan (COZAAR) 25 MG tablet Take 1 tablet (25 mg total) by mouth daily.  . Multiple Vitamins-Minerals (OCUVITE ADULT FORMULA PO) Take by mouth.  . mupirocin ointment (BACTROBAN) 2 % Apply to affected area bid for 10 days  . omeprazole (PRILOSEC) 40 MG capsule Take 1 capsule (40 mg total) by mouth daily.  . traMADol (ULTRAM) 50 MG tablet Take 1 tablet (50 mg total) by mouth daily.  . vitamin B-12 (CYANOCOBALAMIN) 1000 MCG tablet Take 1,000 mcg by mouth daily.   No facility-administered encounter medications on file as of 08/01/2018.     Surgical History: Past Surgical History:  Procedure Laterality Date  . ABDOMINAL HYSTERECTOMY    . APPENDECTOMY    . COLONOSCOPY WITH PROPOFOL N/A 07/09/2015   Procedure: COLONOSCOPY WITH PROPOFOL;  Surgeon: Manya Silvas, MD;  Location: [facility] ENDOSCOPY;  Service: Endoscopy;  Laterality: N/A;    Medical History: Past Medical History:  Diagnosis Date  . Arthritis   . Asthma   . Chronic kidney disease   . COPD (chronic obstructive pulmonary disease) ([place])   . Hypertension   . Hypothyroidism     Family History: Family History  Family history unknown: Yes    Social  History: Social History   Socioeconomic History  . Marital status: Widowed    Spouse name: Not on file  . Number of children: Not on file  . Years of education: Not on file  . Highest education level: Not on file  Occupational History  . Not on file  Social Needs  . Financial resource strain: Not on file  . Food insecurity:    Worry: Not on file    Inability: Not on file  . Transportation needs:     Medical: Not on file    Non-medical: Not on file  Tobacco Use  . Smoking status: Never Smoker  . Smokeless tobacco: Never Used  Substance and Sexual Activity  . Alcohol use: No  . Drug use: No  . Sexual activity: Not on file  Lifestyle  . Physical activity:    Days per week: Not on file    Minutes per session: Not on file  . Stress: Not on file  Relationships  . Social connections:    Talks on phone: Not on file    Gets together: Not on file    Attends religious service: Not on file    Active member of club or organization: Not on file    Attends meetings of clubs or organizations: Not on file    Relationship status: Not on file  . Intimate partner violence:    Fear of current or ex partner: Not on file    Emotionally abused: Not on file    Physically abused: Not on file    Forced sexual activity: Not on file  Other Topics Concern  . Not on file  Social History Narrative  . Not on file    Vital Signs: Blood pressure 122/80, pulse 65, resp. rate 16, height 4\' 8"  (1.422 m), weight 133 lb (60.3 kg), SpO2 97 %.  Examination: General Appearance: The patient is well-developed, well-nourished, and in no distress. Skin: Gross inspection of skin unremarkable. Head: normocephalic, no gross deformities. Eyes: no gross deformities noted. ENT: ears appear grossly normal no exudates. Neck: Supple. No thyromegaly. No LAD. Respiratory: clear bilaterally. Cardiovascular: Normal S1 and S2 without murmur or rub. Extremities: No cyanosis. pulses are equal. Neurologic: Alert and oriented. No involuntary movements.  LABS: No results found for this or any previous visit (from the past 2160 hour(s)).  Radiology: Dg Chest 2 View  Result Date: 01/18/2018 CLINICAL DATA:  Bronchitis.  Cough.  Shortness of breath. EXAM: CHEST - 2 VIEW COMPARISON:  07/22/2010. FINDINGS: Mediastinum and hilar structures are normal. Cardiomegaly. No pulmonary venous congestion mild bibasilar atelectasis. No  pleural effusion or pneumothorax. Thoracolumbar spine scoliosis. Degenerative changes thoracolumbar spine. IMPRESSION: 1.  Mild bibasilar subsegmental atelectasis. 2.  Cardiomegaly.  No pulmonary venous congestion. Electronically Signed   By: Marcello Moores  Register   On: 01/18/2018 05:40    No results found.  No results found.    Assessment and Plan: Patient Active Problem List   Diagnosis Date Noted  . Bilateral carotid artery stenosis 06/19/2018  . Cellulitis of finger of right hand 03/13/2018  . Screening for breast cancer 12/18/2017  . Vitamin D deficiency 12/18/2017  . Dysuria 12/18/2017  . COPD (chronic obstructive pulmonary disease) ([place]) 08/17/2017  . SOB (shortness of breath) 08/17/2017  . GERD (gastroesophageal reflux disease) 08/17/2017  . Hyperlipidemia 08/17/2017  . Hypertension 08/17/2017  . Hypothyroidism 08/17/2017  . Osteoarthritis 08/17/2017   1. Chronic obstructive pulmonary disease, unspecified COPD type ([place]) Patient COPD symptoms are well controlled using albuterol  and Advair.  Patient reports intermittently using nebulizers.  Continue present management return to clinic if symptoms acutely worsen.  2. Gastroesophageal reflux disease without esophagitis Stable, continue current medication.  3. Hypertension, unspecified type Well-controlled continue current regimen.  4. SOB (shortness of breath) FEV1/FVC is 54% which is 74% of the pre-predicted value.  This is decreased from 84% from last year. - Spirometry with Graph   General Counseling: I have discussed the findings of the evaluation and examination with Brenda Tucker.  I have also discussed any further diagnostic evaluation thatmay be needed or ordered today. Brenda Tucker verbalizes understanding of the findings of todays visit. We also reviewed her medications today and discussed drug interactions and side effects including but not limited excessive drowsiness and altered mental states. We also discussed that there is always  a risk not just to her but also people around her. she has been encouraged to call the office with any questions or concerns that should arise related to todays visit.    Time spent: 25 This patient was seen by Orson Gear AGNP-C in Collaboration with Dr. Devona Konig as a part of collaborative care agreement.  I have personally obtained a history, examined the patient, evaluated laboratory and imaging results, formulated the assessment and plan and placed orders.    Allyne Gee, MD [facility] Pulmonary and Critical Care Sleep medicine

## 2018-08-11 ENCOUNTER — Encounter: Payer: Self-pay | Admitting: Internal Medicine

## 2018-08-23 ENCOUNTER — Other Ambulatory Visit: Payer: Self-pay

## 2018-08-30 ENCOUNTER — Ambulatory Visit: Payer: PPO

## 2018-08-30 DIAGNOSIS — I6523 Occlusion and stenosis of bilateral carotid arteries: Secondary | ICD-10-CM | POA: Diagnosis not present

## 2018-09-02 ENCOUNTER — Ambulatory Visit: Payer: Self-pay | Admitting: Nurse Practitioner

## 2018-09-09 ENCOUNTER — Ambulatory Visit (INDEPENDENT_AMBULATORY_CARE_PROVIDER_SITE_OTHER): Payer: PPO | Admitting: Nurse Practitioner

## 2018-09-09 ENCOUNTER — Other Ambulatory Visit: Payer: Self-pay

## 2018-09-09 ENCOUNTER — Encounter: Payer: Self-pay | Admitting: Nurse Practitioner

## 2018-09-09 VITALS — BP 130/60 | HR 63 | Resp 16 | Ht <= 58 in | Wt 135.0 lb

## 2018-09-09 DIAGNOSIS — I6523 Occlusion and stenosis of bilateral carotid arteries: Secondary | ICD-10-CM | POA: Diagnosis not present

## 2018-09-09 DIAGNOSIS — J449 Chronic obstructive pulmonary disease, unspecified: Secondary | ICD-10-CM | POA: Diagnosis not present

## 2018-09-09 DIAGNOSIS — I10 Essential (primary) hypertension: Secondary | ICD-10-CM

## 2018-09-09 NOTE — Progress Notes (Signed)
[facility] [place], [place] [address]  Internal MEDICINE  Office Visit Note  Patient Name: Brenda Tucker  [id number]  [id number]  Date of Service: 09/09/2018  Chief Complaint  Patient presents with  . Medical Management of Chronic Issues    2 month follow up  . Hypertension  . Labs Only    Ultrasound results    The patient had carotid doppler study done 08/30/2018. Results are not yet available to review. We did review her prior carotid study from 08/2017, which showed that she had moderate to severe plaque with 50-69% stenosis, bilaterally. She did consult with vein and vascular. Was told they would not operate as she had arthritis in her neck. She does take aspirin 81mg  daily but is not on statin at this time. Blood pressure is well controlled.   Hypertension  This is a chronic problem. The current episode started more than 1 year ago. The problem has been gradually improving since onset. Associated symptoms include malaise/fatigue, peripheral edema and shortness of breath. Pertinent negatives include no chest pain, headaches, neck pain or palpitations. Agents associated with hypertension include thyroid hormones. Risk factors for coronary artery disease include dyslipidemia. Past treatments include ACE inhibitors, calcium channel blockers and diuretics. The current treatment provides moderate improvement. Compliance problems include exercise.  Hypertensive end-organ damage includes kidney disease. Identifiable causes of hypertension include chronic renal disease.       Current Medication: Outpatient Encounter Medications as of 09/09/2018  Medication Sig  . albuterol (PROVENTIL) (2.5 MG/3ML) 0.083% nebulizer solution Take 2.5 mg by nebulization every 6 (six) hours as needed for wheezing or shortness of breath.  . Albuterol Sulfate (PROAIR RESPICLICK) 253 (90 Base) MCG/ACT AEPB Inhale 2 puffs into the lungs every 4 (four) hours as needed.  Marland Kitchen amLODipine (NORVASC) 5  MG tablet Take 1 tablet (5 mg total) by mouth daily.  Marland Kitchen aspirin EC 81 MG tablet Take 81 mg by mouth daily.  . carvedilol (COREG) 12.5 MG tablet Take 1 tablet (12.5 mg total) by mouth 2 (two) times daily with a meal.  . cephALEXin (KEFLEX) 500 MG capsule Take 1 capsule (500 mg total) by mouth 3 (three) times daily.  . Cholecalciferol (VITAMIN D PO) Take by mouth.  . fluconazole (DIFLUCAN) 150 MG tablet Take 1 tablet po once. [place] repeat dose in 3 days as needed for persistent symptoms.  . Fluticasone-Salmeterol (ADVAIR DISKUS) 250-50 MCG/DOSE AEPB Inhale 1 puff into the lungs 2 (two) times daily.  . hydrochlorothiazide (HYDRODIURIL) 12.5 MG tablet Take 1 tablet (12.5 mg total) by mouth daily.  Marland Kitchen levothyroxine (SYNTHROID, LEVOTHROID) 50 MCG tablet Take 1 tablet (50 mcg total) by mouth daily before breakfast.  . losartan (COZAAR) 25 MG tablet Take 1 tablet (25 mg total) by mouth daily.  . Multiple Vitamins-Minerals (OCUVITE ADULT FORMULA PO) Take by mouth.  . mupirocin ointment (BACTROBAN) 2 % Apply to affected area bid for 10 days  . omeprazole (PRILOSEC) 40 MG capsule Take 1 capsule (40 mg total) by mouth daily.  . traMADol (ULTRAM) 50 MG tablet Take 1 tablet (50 mg total) by mouth daily.  . vitamin B-12 (CYANOCOBALAMIN) 1000 MCG tablet Take 1,000 mcg by mouth daily.   No facility-administered encounter medications on file as of 09/09/2018.     Surgical History: Past Surgical History:  Procedure Laterality Date  . ABDOMINAL HYSTERECTOMY    . APPENDECTOMY    . COLONOSCOPY WITH PROPOFOL N/A 07/09/2015   Procedure: COLONOSCOPY WITH PROPOFOL;  Surgeon: Manya Silvas, MD;  Location: [facility] ENDOSCOPY;  Service: Endoscopy;  Laterality: N/A;    Medical History: Past Medical History:  Diagnosis Date  . Arthritis   . Asthma   . Chronic kidney disease   . COPD (chronic obstructive pulmonary disease) ([place])   . Hypertension   . Hypothyroidism     Family History: Family History  Family history  unknown: Yes    Social History   Socioeconomic History  . Marital status: Widowed    Spouse name: Not on file  . Number of children: Not on file  . Years of education: Not on file  . Highest education level: Not on file  Occupational History  . Not on file  Social Needs  . Financial resource strain: Not on file  . Food insecurity:    Worry: Not on file    Inability: Not on file  . Transportation needs:    Medical: Not on file    Non-medical: Not on file  Tobacco Use  . Smoking status: Never Smoker  . Smokeless tobacco: Never Used  Substance and Sexual Activity  . Alcohol use: No  . Drug use: No  . Sexual activity: Not on file  Lifestyle  . Physical activity:    Days per week: Not on file    Minutes per session: Not on file  . Stress: Not on file  Relationships  . Social connections:    Talks on phone: Not on file    Gets together: Not on file    Attends religious service: Not on file    Active member of club or organization: Not on file    Attends meetings of clubs or organizations: Not on file    Relationship status: Not on file  . Intimate partner violence:    Fear of current or ex partner: Not on file    Emotionally abused: Not on file    Physically abused: Not on file    Forced sexual activity: Not on file  Other Topics Concern  . Not on file  Social History Narrative  . Not on file      Review of Systems  Constitutional: Positive for fatigue and malaise/fatigue. Negative for activity change, chills and unexpected weight change.  HENT: Negative for congestion, postnasal drip, rhinorrhea, sneezing and sore throat.   Eyes: Negative.   Respiratory: Positive for shortness of breath. Negative for cough and chest tightness.        Shortness of breath with exertion.  Cardiovascular: Positive for leg swelling. Negative for chest pain and palpitations.  Gastrointestinal: Negative for abdominal pain, constipation, diarrhea, nausea and vomiting.  Endocrine:  Negative for cold intolerance, heat intolerance, polydipsia and polyuria.  Musculoskeletal: Positive for arthralgias. Negative for back pain, joint swelling and neck pain.  Skin: Negative for rash.  Allergic/Immunologic: Positive for environmental allergies.  Neurological: Negative for dizziness, tremors, light-headedness, numbness and headaches.  Hematological: Negative for adenopathy. Does not bruise/bleed easily.  Psychiatric/Behavioral: Negative for behavioral problems (Depression), dysphoric mood, sleep disturbance and suicidal ideas. The patient is not nervous/anxious.    Today's Vitals   09/09/18 1005  BP: 130/60  Pulse: 63  Resp: 16  SpO2: 97%  Weight: 135 lb (61.2 kg)  Height: 4\' 8"  (1.422 m)   Body mass index is 30.27 kg/m.  Physical Exam Vitals signs and nursing note reviewed.  Constitutional:      General: She is not in acute distress.    Appearance: Normal appearance. She is well-developed. She  is not diaphoretic.  HENT:     Head: Normocephalic and atraumatic.     Nose: Nose normal.     Mouth/Throat:     Pharynx: No oropharyngeal exudate.  Eyes:     Extraocular Movements: Extraocular movements intact.     Conjunctiva/sclera: Conjunctivae normal.     Pupils: Pupils are equal, round, and reactive to light.  Neck:     Musculoskeletal: Normal range of motion and neck supple.     Thyroid: No thyromegaly.     Vascular: Carotid bruit present. No JVD.     Trachea: No tracheal deviation.  Cardiovascular:     Rate and Rhythm: Normal rate and regular rhythm.     Heart sounds: Normal heart sounds. No murmur. No friction rub. No gallop.   Pulmonary:     Effort: Pulmonary effort is normal. No respiratory distress.     Breath sounds: Normal breath sounds. No wheezing or rales.  Chest:     Chest wall: No tenderness.     Breasts:        Right: No inverted nipple, mass, nipple discharge, skin change or tenderness.        Left: No inverted nipple, mass, nipple discharge,  skin change or tenderness.  Abdominal:     General: Bowel sounds are normal.     Palpations: Abdomen is soft.     Tenderness: There is no abdominal tenderness.  Musculoskeletal: Normal range of motion.  Lymphadenopathy:     Cervical: No cervical adenopathy.  Skin:    General: Skin is warm and dry.  Neurological:     General: No focal deficit present.     Mental Status: She is alert and oriented to person, place, and time.     Cranial Nerves: No cranial nerve deficit.  Psychiatric:        Mood and Affect: Mood normal.        Behavior: Behavior normal.        Thought Content: Thought content normal.        Judgment: Judgment normal.    Assessment/Plan: 1. Bilateral carotid artery stenosis Results of carotid doppler not available to review at this time. Will contact her via phone as soon as possible to discuss results. Discussed started statin to reduce change of stenosis in future. [place] also consider new referral to vein and vascular, however, will send to different provider.   2. Hypertension, unspecified type Stable. contineu bp medication as prescribed   3. Chronic obstructive pulmonary disease, unspecified COPD type ([place]) Continue use of inhalers and respiratory medications as prescribed  . General Counseling: Brenda Tucker verbalizes understanding of the findings of todays visit and agrees with plan of treatment. I have discussed any further diagnostic evaluation that [place] be needed or ordered today. We also reviewed her medications today. she has been encouraged to call the office with any questions or concerns that should arise related to todays visit.  Cardiac risk factor modification:  1. Control blood pressure. 2. Exercise as prescribed. 3. Follow low sodium, low fat diet. and low fat and low cholestrol diet. 4. Take ASA 81mg  once a day. 5. Restricted calories diet to lose weight.  This patient was seen by Leretha Pol FNP Collaboration with Dr Lavera Guise as a part of  collaborative care agreement  Time spent: 25 Minutes      Dr Lavera Guise Internal medicine

## 2018-09-15 NOTE — Procedures (Signed)
[place], [place] [address]  DATE OF SERVICE: August 30, 2018  CAROTID DOPPLER INTERPRETATION:  Bilateral Carotid Ultrsasound and Color Doppler Examination was performed. The RIGHT CCA shows moderate plaque in the vessel. The LEFT CCA shows mild plaque in the vessel. There was mild intimal thickening noted in the RIGHT carotid artery. There was mild intimal thickening in the LEFT carotid artery.  The RIGHT CCA shows peak systolic velocity of 68 cm per second. The end diastolic velocity is 13 cm per second on the RIGHT side. The RIGHT ICA shows peak systolic velocity of 324 per second. RIGHT sided ICA end diastolic velocity is 24 cm per second. The RIGHT ECA shows a peak systolic velocity of 401 cm per second. The ICA/CCA ratio is calculated to be 2.6. This suggests stenosis of 50 to 69%. The Vertebral Artery shows antegrade flow.  The LEFT CCA shows peak systolic velocity of 70 cm per second. The end diastolic velocity is 15 cm per second on the LEFT side. The LEFT ICA shows peak systolic velocity of 027 per second. LEFT sided ICA end diastolic velocity is 28 cm per second. The LEFT ECA shows a peak systolic velocity of 253 cm per second. The ICA/CCA ratio is calculated to be 1.7. This suggests less than 50% stenosis. The Vertebral Artery shows antegrade flow.   Impression:    The RIGHT CAROTID shows 50 to 69% stenosis. The LEFT CAROTID shows less than 50% stenosis.  There is moderate plaque formation noted on the LEFT and mild plaque on the RIGHT  side. Consider a repeat Carotid doppler if clinical situation and symptoms warrant in 6-12 months. Patient should be encouraged to change lifestyles such as smoking cessation, regular exercise and dietary modification. Use of statins in the right clinical setting and ASA is encouraged.  Allyne Gee, MD [facility] Pulmonary Critical Care Medicine

## 2018-09-17 ENCOUNTER — Telehealth: Payer: Self-pay

## 2018-09-17 NOTE — Telephone Encounter (Signed)
Pt returned my call, informed pt of her carotid Ultrasound results.

## 2018-09-17 NOTE — Telephone Encounter (Signed)
Called pt and told her to call back for her results

## 2018-12-11 DIAGNOSIS — H353132 Nonexudative age-related macular degeneration, bilateral, intermediate dry stage: Secondary | ICD-10-CM | POA: Diagnosis not present

## 2018-12-19 ENCOUNTER — Ambulatory Visit (INDEPENDENT_AMBULATORY_CARE_PROVIDER_SITE_OTHER): Payer: PPO | Admitting: Nurse Practitioner

## 2018-12-19 ENCOUNTER — Encounter: Payer: Self-pay | Admitting: Nurse Practitioner

## 2018-12-19 ENCOUNTER — Other Ambulatory Visit: Payer: Self-pay

## 2018-12-19 VITALS — BP 170/76 | HR 68 | Resp 16 | Ht <= 58 in | Wt 137.6 lb

## 2018-12-19 DIAGNOSIS — N390 Urinary tract infection, site not specified: Secondary | ICD-10-CM | POA: Diagnosis not present

## 2018-12-19 DIAGNOSIS — Z0001 Encounter for general adult medical examination with abnormal findings: Secondary | ICD-10-CM | POA: Diagnosis not present

## 2018-12-19 DIAGNOSIS — R300 Dysuria: Secondary | ICD-10-CM

## 2018-12-19 DIAGNOSIS — M545 Low back pain, unspecified: Secondary | ICD-10-CM

## 2018-12-19 DIAGNOSIS — Z1239 Encounter for other screening for malignant neoplasm of breast: Secondary | ICD-10-CM | POA: Diagnosis not present

## 2018-12-19 DIAGNOSIS — I10 Essential (primary) hypertension: Secondary | ICD-10-CM

## 2018-12-19 DIAGNOSIS — R319 Hematuria, unspecified: Secondary | ICD-10-CM

## 2018-12-19 DIAGNOSIS — E559 Vitamin D deficiency, unspecified: Secondary | ICD-10-CM

## 2018-12-19 LAB — POCT URINALYSIS DIPSTICK
Bilirubin, UA: NEGATIVE
Glucose, UA: NEGATIVE
Ketones, UA: NEGATIVE
Nitrite, UA: NEGATIVE
Protein, UA: POSITIVE — AB
Spec Grav, UA: 1.02 (ref 1.010–1.025)
Urobilinogen, UA: 0.2 E.U./dL
pH, UA: 6 (ref 5.0–8.0)

## 2018-12-19 MED ORDER — SULFAMETHOXAZOLE-TRIMETHOPRIM 800-160 MG PO TABS: 1.0000 | ORAL_TABLET | Freq: Two times a day (BID) | ORAL | 0 refills | Status: DC

## 2018-12-19 NOTE — Progress Notes (Signed)
[facility] [place], [place] [address]  Internal MEDICINE  Office Visit Note  Patient Name: Brenda Tucker  [id number]  [id number]  Date of Service: 12/20/2018   Pt is here for routine health maintenance examination  Chief Complaint  Patient presents with  . Medicare Wellness    pt is concerned about getting cramps, legs, hands, arms  . Arthritis  . Hypertension  . Hypothyroidism  . Pain    lower back pain,      The patient is here for health maintenance exam. Blood pressure is moderately elevated. She states that having to wear a mask runs her blood pressure up. She states that she has not taken her blood pressure medication yet today. Has not eaten yet and does not take meds until she eats . She does see nephrology due to chronic kidney disease. He generally manages her blood pressure. She also reports shortness of breath with exertion. She is at her baseline. Takes advair twice daily. Uses rescue inhaler as needed. This is not everyday. She is due to have screening mammogram as well as routine, fasting labs     Current Medication: Outpatient Encounter Medications as of 12/19/2018  Medication Sig  . Albuterol Sulfate (PROAIR RESPICLICK) 932 (90 Base) MCG/ACT AEPB Inhale 2 puffs into the lungs every 4 (four) hours as needed.  Marland Kitchen amLODipine (NORVASC) 5 MG tablet Take 1 tablet (5 mg total) by mouth daily.  Marland Kitchen aspirin EC 81 MG tablet Take 81 mg by mouth daily.  . carvedilol (COREG) 12.5 MG tablet Take 1 tablet (12.5 mg total) by mouth 2 (two) times daily with a meal.  . Cholecalciferol (VITAMIN D PO) Take by mouth.  . Fluticasone-Salmeterol (ADVAIR DISKUS) 250-50 MCG/DOSE AEPB Inhale 1 puff into the lungs 2 (two) times daily.  Marland Kitchen levothyroxine (SYNTHROID, LEVOTHROID) 50 MCG tablet Take 1 tablet (50 mcg total) by mouth daily before breakfast.  . losartan (COZAAR) 100 MG tablet Take 100 mg by mouth daily.  . vitamin B-12 (CYANOCOBALAMIN) 1000 MCG tablet Take  1,000 mcg by mouth daily.  Marland Kitchen sulfamethoxazole-trimethoprim (BACTRIM DS) 800-160 MG tablet Take 1 tablet by mouth 2 (two) times daily.  . [DISCONTINUED] albuterol (PROVENTIL) (2.5 MG/3ML) 0.083% nebulizer solution Take 2.5 mg by nebulization every 6 (six) hours as needed for wheezing or shortness of breath.  . [DISCONTINUED] cephALEXin (KEFLEX) 500 MG capsule Take 1 capsule (500 mg total) by mouth 3 (three) times daily. (Patient not taking: Reported on 12/19/2018)  . [DISCONTINUED] fluconazole (DIFLUCAN) 150 MG tablet Take 1 tablet po once. [place] repeat dose in 3 days as needed for persistent symptoms. (Patient not taking: Reported on 12/19/2018)  . [DISCONTINUED] hydrochlorothiazide (HYDRODIURIL) 12.5 MG tablet Take 1 tablet (12.5 mg total) by mouth daily. (Patient not taking: Reported on 12/19/2018)  . [DISCONTINUED] losartan (COZAAR) 25 MG tablet Take 1 tablet (25 mg total) by mouth daily. (Patient not taking: Reported on 12/19/2018)  . [DISCONTINUED] Multiple Vitamins-Minerals (OCUVITE ADULT FORMULA PO) Take by mouth.  . [DISCONTINUED] mupirocin ointment (BACTROBAN) 2 % Apply to affected area bid for 10 days (Patient not taking: Reported on 12/19/2018)  . [DISCONTINUED] omeprazole (PRILOSEC) 40 MG capsule Take 1 capsule (40 mg total) by mouth daily. (Patient not taking: Reported on 12/19/2018)  . [DISCONTINUED] traMADol (ULTRAM) 50 MG tablet Take 1 tablet (50 mg total) by mouth daily. (Patient not taking: Reported on 12/19/2018)   No facility-administered encounter medications on file as of 12/19/2018.     Surgical History:  Past Surgical History:  Procedure Laterality Date  . ABDOMINAL HYSTERECTOMY    . APPENDECTOMY    . COLONOSCOPY WITH PROPOFOL N/A 07/09/2015   Procedure: COLONOSCOPY WITH PROPOFOL;  Surgeon: Manya Silvas, MD;  Location: [facility] ENDOSCOPY;  Service: Endoscopy;  Laterality: N/A;    Medical History: Past Medical History:  Diagnosis Date  . Arthritis   . Asthma   . Chronic kidney  disease   . COPD (chronic obstructive pulmonary disease) ([place])   . Hypertension   . Hypothyroidism     Family History: Family History  Family history unknown: Yes      Review of Systems  Constitutional: Positive for fatigue. Negative for activity change, chills and unexpected weight change.  HENT: Negative for congestion, postnasal drip, rhinorrhea, sneezing and sore throat.   Respiratory: Positive for shortness of breath. Negative for cough, chest tightness and wheezing.        Shortness of breath with exertion.  Cardiovascular: Positive for leg swelling. Negative for chest pain and palpitations.  Gastrointestinal: Negative for abdominal pain, constipation, diarrhea, nausea and vomiting.  Endocrine: Negative for cold intolerance, heat intolerance, polydipsia and polyuria.  Genitourinary: Positive for flank pain and urgency.  Musculoskeletal: Positive for arthralgias and back pain. Negative for joint swelling and neck pain.  Skin: Negative for rash.  Allergic/Immunologic: Positive for environmental allergies.  Neurological: Negative for dizziness, tremors, light-headedness, numbness and headaches.  Hematological: Negative for adenopathy. Does not bruise/bleed easily.  Psychiatric/Behavioral: Negative for behavioral problems (Depression), dysphoric mood, sleep disturbance and suicidal ideas. The patient is not nervous/anxious.      Today's Vitals   12/19/18 1000  BP: (!) 170/76  Pulse: 68  Resp: 16  SpO2: 97%  Weight: 137 lb 9.6 oz (62.4 kg)  Height: 4\' 8"  (1.422 m)   Body mass index is 30.85 kg/m.  Physical Exam Vitals signs and nursing note reviewed.  Constitutional:      General: She is not in acute distress.    Appearance: Normal appearance. She is well-developed. She is not diaphoretic.  HENT:     Head: Normocephalic and atraumatic.     Nose: Nose normal.     Mouth/Throat:     Pharynx: No oropharyngeal exudate.  Eyes:     Extraocular Movements: Extraocular  movements intact.     Conjunctiva/sclera: Conjunctivae normal.     Pupils: Pupils are equal, round, and reactive to light.  Neck:     Musculoskeletal: Normal range of motion and neck supple.     Thyroid: No thyromegaly.     Vascular: Carotid bruit present. No JVD.     Trachea: No tracheal deviation.  Cardiovascular:     Rate and Rhythm: Normal rate and regular rhythm.     Pulses: Normal pulses.     Heart sounds: Normal heart sounds. No murmur. No friction rub. No gallop.   Pulmonary:     Effort: Pulmonary effort is normal. No respiratory distress.     Breath sounds: Normal breath sounds. No wheezing or rales.  Chest:     Chest wall: No tenderness.     Breasts:        Right: No inverted nipple, mass, nipple discharge, skin change or tenderness.        Left: No inverted nipple, mass, nipple discharge, skin change or tenderness.  Abdominal:     General: Bowel sounds are normal.     Palpations: Abdomen is soft.     Tenderness: There is no abdominal tenderness.  Musculoskeletal: Normal  range of motion.  Lymphadenopathy:     Cervical: No cervical adenopathy.  Skin:    General: Skin is warm and dry.  Neurological:     General: No focal deficit present.     Mental Status: She is alert and oriented to person, place, and time.     Cranial Nerves: No cranial nerve deficit.  Psychiatric:        Mood and Affect: Mood normal.        Behavior: Behavior normal.        Thought Content: Thought content normal.        Judgment: Judgment normal.    Depression screen [facility] 2/9 12/19/2018 09/09/2018 06/18/2018 03/05/2018 12/17/2017  Decreased Interest 0 0 0 0 0  Down, Depressed, Hopeless 0 0 0 0 0  PHQ - 2 Score 0 0 0 0 0    Functional Status Survey: Is the patient deaf or have difficulty hearing?: No Does the patient have difficulty seeing, even when wearing glasses/contacts?: No Does the patient have difficulty concentrating, remembering, or making decisions?: No Does the patient have  difficulty walking or climbing stairs?: Yes Does the patient have difficulty dressing or bathing?: No Does the patient have difficulty doing errands alone such as visiting a doctor's office or shopping?: No  MMSE - [place] Exam 12/19/2018 12/17/2017  Orientation to time 5 5  Orientation to Place 5 5  Registration 3 3  Attention/ Calculation 5 5  Recall 3 3  Language- name 2 objects 2 2  Language- repeat 1 1  Language- follow 3 step command 3 3  Language- read & follow direction 1 1  Write a sentence 1 0  Copy design 1 1  Total score 30 29    Fall Risk  12/19/2018 09/09/2018 06/18/2018 03/05/2018 12/17/2017  Falls in the past year? 0 0 0 No No    Assessment/Plan: 1. Encounter for general adult medical examination with abnormal findings Annual health maintenance exam today. Routine, fasting labs ordered.  - Comprehensive metabolic panel; Future - T4, free; Future - TSH; Future - Lipid panel; Future  2. Urinary tract infection with hematuria, site unspecified Treat with bactrim DS bid for 7 days. Send urine sample for culture and sensitivity and adjust antibiotics as indicated.  - sulfamethoxazole-trimethoprim (BACTRIM DS) 800-160 MG tablet; Take 1 tablet by mouth 2 (two) times daily.  Dispense: 14 tablet; Refill: 0  3. Low back pain, unspecified back pain laterality, unspecified chronicity, unspecified whether sciatica present Worse today, likely from urinary tract infection.  - POCT Urinalysis Dipstick - CULTURE, URINE COMPREHENSIVE  4. Hypertension, unspecified type Generally stable. Continue bp medication as prescribed  - CBC with Differential/Platelet; Future - Comprehensive metabolic panel; Future - Lipid panel; Future  5. Vitamin D deficiency - Vitamin D 1,25 dihydroxy; Future  6. Screening for breast cancer - MM DIGITAL SCREENING BILATERAL; Future  7. Dysuria - POCT Urinalysis Dipstick - CULTURE, URINE COMPREHENSIVE  General Counseling: Shara verbalizes  understanding of the findings of todays visit and agrees with plan of treatment. I have discussed any further diagnostic evaluation that [place] be needed or ordered today. We also reviewed her medications today. she has been encouraged to call the office with any questions or concerns that should arise related to todays visit.    Counseling:  Hypertension Counseling:   The following hypertensive lifestyle modification were recommended and discussed:  1. Limiting alcohol intake to less than 1 oz/day of ethanol:(24 oz of beer or 8 oz of  wine or 2 oz of 100-proof whiskey). 2. Take baby ASA 81 mg daily. 3. Importance of regular aerobic exercise and losing weight. 4. Reduce dietary saturated fat and cholesterol intake for overall cardiovascular health. 5. Maintaining adequate dietary potassium, calcium, and magnesium intake. 6. Regular monitoring of the blood pressure. 7. Reduce sodium intake to less than 100 mmol/day (less than 2.3 gm of sodium or less than 6 gm of sodium choride)   This patient was seen by [place] with Dr Lavera Guise as a part of collaborative care agreement  Orders Placed This Encounter  Procedures  . CULTURE, URINE COMPREHENSIVE  . MM DIGITAL SCREENING BILATERAL  . CBC with Differential/Platelet  . Comprehensive metabolic panel  . T4, free  . TSH  . Lipid panel  . Vitamin D 1,25 dihydroxy  . POCT Urinalysis Dipstick    Meds ordered this encounter  Medications  . sulfamethoxazole-trimethoprim (BACTRIM DS) 800-160 MG tablet    Sig: Take 1 tablet by mouth 2 (two) times daily.    Dispense:  14 tablet    Refill:  0    Order Specific Question:   Supervising Provider    Answer:   Lavera Guise [[id number]]    Time spent: [place], MD  Internal Medicine

## 2018-12-19 NOTE — Progress Notes (Signed)
Pt blood pressure elevated, she stated that she have not yet taken her medication due to not eating yet. Taken twice  1st reading 185/88 2nd reading 170/76

## 2018-12-20 DIAGNOSIS — M545 Low back pain, unspecified: Secondary | ICD-10-CM | POA: Insufficient documentation

## 2018-12-20 DIAGNOSIS — N390 Urinary tract infection, site not specified: Secondary | ICD-10-CM | POA: Insufficient documentation

## 2018-12-22 LAB — CULTURE, URINE COMPREHENSIVE

## 2018-12-30 ENCOUNTER — Other Ambulatory Visit: Payer: Self-pay

## 2018-12-30 DIAGNOSIS — I10 Essential (primary) hypertension: Secondary | ICD-10-CM

## 2018-12-30 MED ORDER — AMLODIPINE BESYLATE 5 MG PO TABS: 5.0000 mg | ORAL_TABLET | Freq: Every day | ORAL | 1 refills | Status: DC

## 2018-12-31 ENCOUNTER — Other Ambulatory Visit: Payer: Self-pay

## 2018-12-31 DIAGNOSIS — I10 Essential (primary) hypertension: Secondary | ICD-10-CM

## 2018-12-31 MED ORDER — AMLODIPINE BESYLATE 5 MG PO TABS: 5.0000 mg | ORAL_TABLET | Freq: Every day | ORAL | 1 refills | Status: DC

## 2019-01-08 DIAGNOSIS — I10 Essential (primary) hypertension: Secondary | ICD-10-CM | POA: Diagnosis not present

## 2019-01-08 DIAGNOSIS — N390 Urinary tract infection, site not specified: Secondary | ICD-10-CM | POA: Diagnosis not present

## 2019-01-08 DIAGNOSIS — N281 Cyst of kidney, acquired: Secondary | ICD-10-CM | POA: Diagnosis not present

## 2019-01-08 DIAGNOSIS — N183 Chronic kidney disease, stage 3 (moderate): Secondary | ICD-10-CM | POA: Diagnosis not present

## 2019-01-13 ENCOUNTER — Telehealth: Payer: Self-pay

## 2019-01-13 DIAGNOSIS — N183 Chronic kidney disease, stage 3 (moderate): Secondary | ICD-10-CM | POA: Diagnosis not present

## 2019-01-13 DIAGNOSIS — R809 Proteinuria, unspecified: Secondary | ICD-10-CM | POA: Diagnosis not present

## 2019-01-13 DIAGNOSIS — R319 Hematuria, unspecified: Secondary | ICD-10-CM | POA: Diagnosis not present

## 2019-01-13 DIAGNOSIS — I129 Hypertensive chronic kidney disease with stage 1 through stage 4 chronic kidney disease, or unspecified chronic kidney disease: Secondary | ICD-10-CM | POA: Diagnosis not present

## 2019-01-15 ENCOUNTER — Telehealth: Payer: Self-pay

## 2019-01-15 ENCOUNTER — Other Ambulatory Visit: Payer: Self-pay

## 2019-01-15 DIAGNOSIS — R0602 Shortness of breath: Secondary | ICD-10-CM

## 2019-01-15 MED ORDER — PROAIR RESPICLICK 108 (90 BASE) MCG/ACT IN AEPB: 2.0000 | INHALATION_SPRAY | RESPIRATORY_TRACT | 5 refills | Status: DC | PRN

## 2019-01-15 NOTE — Telephone Encounter (Signed)
Error

## 2019-01-15 NOTE — Telephone Encounter (Signed)
Will be sent today

## 2019-01-31 ENCOUNTER — Ambulatory Visit
Admission: RE | Admit: 2019-01-31 | Discharge: 2019-01-31 | Disposition: A | Discharge status: Home / Self Care | Payer: PPO | Source: Ambulatory Visit | Attending: Nurse Practitioner | Admitting: Nurse Practitioner

## 2019-01-31 DIAGNOSIS — Z1239 Encounter for other screening for malignant neoplasm of breast: Secondary | ICD-10-CM

## 2019-01-31 DIAGNOSIS — Z1231 Encounter for screening mammogram for malignant neoplasm of breast: Secondary | ICD-10-CM | POA: Insufficient documentation

## 2019-01-31 NOTE — Telephone Encounter (Signed)
Ok thanks 

## 2019-02-05 ENCOUNTER — Other Ambulatory Visit: Payer: Self-pay

## 2019-02-05 DIAGNOSIS — I10 Essential (primary) hypertension: Secondary | ICD-10-CM

## 2019-02-05 MED ORDER — CARVEDILOL 12.5 MG PO TABS: 12.5000 mg | ORAL_TABLET | Freq: Two times a day (BID) | ORAL | 1 refills | Status: DC

## 2019-02-06 ENCOUNTER — Ambulatory Visit: Payer: PPO | Admitting: Internal Medicine

## 2019-02-06 ENCOUNTER — Encounter: Payer: Self-pay | Admitting: Internal Medicine

## 2019-02-06 ENCOUNTER — Other Ambulatory Visit: Payer: Self-pay

## 2019-02-06 VITALS — BP 125/68 | HR 70 | Resp 16 | Ht <= 58 in | Wt 132.0 lb

## 2019-02-06 DIAGNOSIS — J449 Chronic obstructive pulmonary disease, unspecified: Secondary | ICD-10-CM | POA: Diagnosis not present

## 2019-02-06 DIAGNOSIS — I10 Essential (primary) hypertension: Secondary | ICD-10-CM | POA: Diagnosis not present

## 2019-02-06 DIAGNOSIS — K219 Gastro-esophageal reflux disease without esophagitis: Secondary | ICD-10-CM | POA: Diagnosis not present

## 2019-02-06 DIAGNOSIS — R0602 Shortness of breath: Secondary | ICD-10-CM

## 2019-02-06 NOTE — Progress Notes (Signed)
[facility] [place], [place] [address]  Pulmonary Sleep Medicine   Office Visit Note  Patient Name: Brenda Tucker DOB: [birth year] MRN [id number]  Date of Service: 02/06/2019  Complaints/HPI: Pt is here for follow up.  She reports overall she has been doing well.  She denies any recent hospitalizations.  She is using her inhalers as prescribed. She is currently using advair daily, and proair as needed.  She reports using proair daily if she is up and moving around much.   ROS  General: (-) fever, (-) chills, (-) night sweats, (-) weakness Skin: (-) rashes, (-) itching,. Eyes: (-) visual changes, (-) redness, (-) itching. Nose and Sinuses: (-) nasal stuffiness or itchiness, (-) postnasal drip, (-) nosebleeds, (-) sinus trouble. Mouth and Throat: (-) sore throat, (-) hoarseness. Neck: (-) swollen glands, (-) enlarged thyroid, (-) neck pain. Respiratory: - cough, (-) bloody sputum, + shortness of breath, - wheezing. Cardiovascular: - ankle swelling, (-) chest pain. Lymphatic: (-) lymph node enlargement. Neurologic: (-) numbness, (-) tingling. Psychiatric: (-) anxiety, (-) depression   Current Medication: Outpatient Encounter Medications as of 02/06/2019  Medication Sig  . Albuterol Sulfate (PROAIR RESPICLICK) 932 (90 Base) MCG/ACT AEPB Inhale 2 puffs into the lungs every 4 (four) hours as needed.  Marland Kitchen amLODipine (NORVASC) 5 MG tablet Take 1 tablet (5 mg total) by mouth daily.  Marland Kitchen aspirin EC 81 MG tablet Take 81 mg by mouth daily.  . carvedilol (COREG) 12.5 MG tablet Take 1 tablet (12.5 mg total) by mouth 2 (two) times daily with a meal.  . Cholecalciferol (VITAMIN D PO) Take by mouth.  . Fluticasone-Salmeterol (ADVAIR DISKUS) 250-50 MCG/DOSE AEPB Inhale 1 puff into the lungs 2 (two) times daily.  Marland Kitchen levothyroxine (SYNTHROID, LEVOTHROID) 50 MCG tablet Take 1 tablet (50 mcg total) by mouth daily before breakfast.  . losartan (COZAAR) 100 MG tablet Take 100 mg by  mouth daily.  . vitamin B-12 (CYANOCOBALAMIN) 1000 MCG tablet Take 1,000 mcg by mouth daily.  . [DISCONTINUED] sulfamethoxazole-trimethoprim (BACTRIM DS) 800-160 MG tablet Take 1 tablet by mouth 2 (two) times daily. (Patient not taking: Reported on 02/06/2019)   No facility-administered encounter medications on file as of 02/06/2019.     Surgical History: Past Surgical History:  Procedure Laterality Date  . ABDOMINAL HYSTERECTOMY    . APPENDECTOMY    . COLONOSCOPY WITH PROPOFOL N/A 07/09/2015   Procedure: COLONOSCOPY WITH PROPOFOL;  Surgeon: Manya Silvas, MD;  Location: [facility] ENDOSCOPY;  Service: Endoscopy;  Laterality: N/A;    Medical History: Past Medical History:  Diagnosis Date  . Arthritis   . Asthma   . Chronic kidney disease   . COPD (chronic obstructive pulmonary disease) ([place])   . Hypertension   . Hypothyroidism     Family History: Family History  Family history unknown: Yes    Social History: Social History   Socioeconomic History  . Marital status: Widowed    Spouse name: Not on file  . Number of children: Not on file  . Years of education: Not on file  . Highest education level: Not on file  Occupational History  . Not on file  Social Needs  . Financial resource strain: Not on file  . Food insecurity    Worry: Not on file    Inability: Not on file  . Transportation needs    Medical: Not on file    Non-medical: Not on file  Tobacco Use  . Smoking status: Never Smoker  .  Smokeless tobacco: Never Used  Substance and Sexual Activity  . Alcohol use: No  . Drug use: No  . Sexual activity: Not on file  Lifestyle  . Physical activity    Days per week: Not on file    Minutes per session: Not on file  . Stress: Not on file  Relationships  . Social Herbalist on phone: Not on file    Gets together: Not on file    Attends religious service: Not on file    Active member of club or organization: Not on file    Attends meetings of clubs or  organizations: Not on file    Relationship status: Not on file  . Intimate partner violence    Fear of current or ex partner: Not on file    Emotionally abused: Not on file    Physically abused: Not on file    Forced sexual activity: Not on file  Other Topics Concern  . Not on file  Social History Narrative  . Not on file    Vital Signs: Blood pressure 125/68, pulse 70, resp. rate 16, height 4\' 8"  (1.422 m), weight 132 lb (59.9 kg), SpO2 94 %.  Examination: General Appearance: The patient is well-developed, well-nourished, and in no distress. Skin: Gross inspection of skin unremarkable. Head: normocephalic, no gross deformities. Eyes: no gross deformities noted. ENT: ears appear grossly normal no exudates. Neck: Supple. No thyromegaly. No LAD. Respiratory: clear bilaterally. Cardiovascular: Normal S1 and S2 without murmur or rub. Extremities: No cyanosis. pulses are equal. Neurologic: Alert and oriented. No involuntary movements.  LABS: Recent Results (from the past 2160 hour(s))  POCT Urinalysis Dipstick     Status: Abnormal   Collection Time: 12/19/18 10:56 AM  Result Value Ref Range   Color, UA     Clarity, UA     Glucose, UA Negative Negative   Bilirubin, UA neg    Ketones, UA neg    Spec Grav, UA 1.020 1.010 - 1.025   Blood, UA small    pH, UA 6.0 5.0 - 8.0   Protein, UA Positive (A) Negative   Urobilinogen, UA 0.2 0.2 or 1.0 E.U./dL   Nitrite, UA neg    Leukocytes, UA Large (3+) (A) Negative   Appearance     Odor    CULTURE, URINE COMPREHENSIVE     Status: Abnormal   Collection Time: 12/19/18 10:56 AM   Specimen: Urine   URINE  Result Value Ref Range   Urine Culture, Comprehensive Final report (A)    Organism ID, Bacteria Escherichia coli (A)     Comment: Greater than 100,000 colony forming units per mL Cefazolin <=4 ug/mL Cefazolin with an MIC <=16 predicts susceptibility to the oral agents cefaclor, cefdinir, cefpodoxime, cefprozil, cefuroxime,  cephalexin, and loracarbef when used for therapy of uncomplicated urinary tract infections due to E. coli, Klebsiella pneumoniae, and Proteus mirabilis.    ANTIMICROBIAL SUSCEPTIBILITY Comment     Comment:       ** S = Susceptible; I = Intermediate; R = Resistant **                    P = Positive; N = Negative             MICS are expressed in micrograms per mL    Antibiotic                 RSLT#1    RSLT#2    RSLT#3  RSLT#4 Amoxicillin/Clavulanic Acid    S Ampicillin                     S Cefepime                       S Ceftriaxone                    S Cefuroxime                     S Ciprofloxacin                  S Ertapenem                      S Gentamicin                     S Imipenem                       S Levofloxacin                   S Meropenem                      S Nitrofurantoin                 S Piperacillin/Tazobactam        S Tetracycline                   S Tobramycin                     S Trimethoprim/Sulfa             S     Radiology: Mm 3d Screen Breast Bilateral  Result Date: 01/31/2019 CLINICAL DATA:  Screening. EXAM: DIGITAL SCREENING BILATERAL MAMMOGRAM WITH TOMO AND CAD COMPARISON:  Previous exam(s). ACR Breast Density Category c: The breast tissue is heterogeneously dense, which [place] obscure small masses. FINDINGS: There are no findings suspicious for malignancy. Images were processed with CAD. IMPRESSION: No mammographic evidence of malignancy. A result letter of this screening mammogram will be mailed directly to the patient. RECOMMENDATION: Screening mammogram in one year. (Code:SM-B-01Y) BI-RADS CATEGORY  1: Negative. Electronically Signed   By: Fidela Salisbury M.D.   On: 01/31/2019 13:01    No results found.  Mm 3d Screen Breast Bilateral  Result Date: 01/31/2019 CLINICAL DATA:  Screening. EXAM: DIGITAL SCREENING BILATERAL MAMMOGRAM WITH TOMO AND CAD COMPARISON:  Previous exam(s). ACR Breast Density Category c: The breast tissue is  heterogeneously dense, which [place] obscure small masses. FINDINGS: There are no findings suspicious for malignancy. Images were processed with CAD. IMPRESSION: No mammographic evidence of malignancy. A result letter of this screening mammogram will be mailed directly to the patient. RECOMMENDATION: Screening mammogram in one year. (Code:SM-B-01Y) BI-RADS CATEGORY  1: Negative. Electronically Signed   By: Fidela Salisbury M.D.   On: 01/31/2019 13:01      Assessment and Plan: Patient Active Problem List   Diagnosis Date Noted  . Urinary tract infection with hematuria 12/20/2018  . Low back pain 12/20/2018  . Bilateral carotid artery stenosis 06/19/2018  . Cellulitis of finger of right hand 03/13/2018  . Encounter for general adult medical examination with abnormal findings 12/18/2017  . Vitamin D deficiency 12/18/2017  . Dysuria 12/18/2017  . Chronic obstructive pulmonary disease (  [place]) 08/17/2017  . SOB (shortness of breath) 08/17/2017  . GERD (gastroesophageal reflux disease) 08/17/2017  . Hyperlipidemia 08/17/2017  . Hypertension 08/17/2017  . Hypothyroidism 08/17/2017  . Osteoarthritis 08/17/2017    1. Chronic obstructive pulmonary disease, unspecified COPD type ([place]) Stable, continue present management. Continue to use inhalers as directed.   2. Gastroesophageal reflux disease without esophagitis Stable, continue medication as prescribed.   3. Hypertension, unspecified type Stable, continue present management.  4. SOB (shortness of breath) FEV1 stable - Spirometry with Graph  General Counseling: I have discussed the findings of the evaluation and examination with [facility].  I have also discussed any further diagnostic evaluation thatmay be needed or ordered today. Adamae verbalizes understanding of the findings of todays visit. We also reviewed her medications today and discussed drug interactions and side effects including but not limited excessive drowsiness and altered mental  states. We also discussed that there is always a risk not just to her but also people around her. she has been encouraged to call the office with any questions or concerns that should arise related to todays visit.    Time spent: 15 This patient was seen by Orson Gear AGNP-C in Collaboration with Dr. Devona Konig as a part of collaborative care agreement.   I have personally obtained a history, examined the patient, evaluated laboratory and imaging results, formulated the assessment and plan and placed orders.    Allyne Gee, MD [facility] Pulmonary and Critical Care Sleep medicine

## 2019-02-12 NOTE — Progress Notes (Signed)
Negative mammogram

## 2019-03-31 ENCOUNTER — Other Ambulatory Visit: Payer: Self-pay

## 2019-03-31 DIAGNOSIS — I10 Essential (primary) hypertension: Secondary | ICD-10-CM

## 2019-03-31 MED ORDER — AMLODIPINE BESYLATE 5 MG PO TABS: 5.0000 mg | ORAL_TABLET | Freq: Every day | ORAL | 1 refills | Status: DC

## 2019-04-07 DIAGNOSIS — H353132 Nonexudative age-related macular degeneration, bilateral, intermediate dry stage: Secondary | ICD-10-CM | POA: Diagnosis not present

## 2019-06-18 DIAGNOSIS — H353132 Nonexudative age-related macular degeneration, bilateral, intermediate dry stage: Secondary | ICD-10-CM | POA: Diagnosis not present

## 2019-06-18 DIAGNOSIS — H40023 Open angle with borderline findings, high risk, bilateral: Secondary | ICD-10-CM | POA: Diagnosis not present

## 2019-06-19 ENCOUNTER — Telehealth: Payer: Self-pay

## 2019-06-19 NOTE — Telephone Encounter (Signed)
Called lmom informing patient of appointment. klh 

## 2019-06-20 IMAGING — MG MM DIGITAL SCREENING BILAT W/ TOMO W/ CAD
8 series · 8 of 24 positions shown · non-contrast
Comparison: Previous exam(s).

CLINICAL DATA: Screening.

EXAM:
DIGITAL SCREENING BILATERAL MAMMOGRAM WITH TOMO AND CAD

[L CC synth-2D]
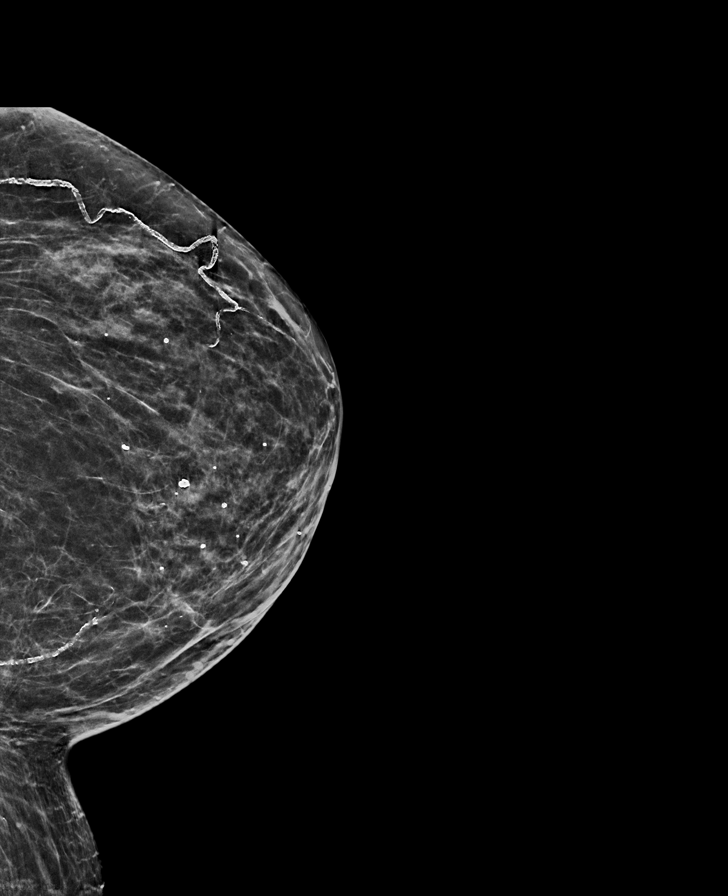

[R CC synth-2D]
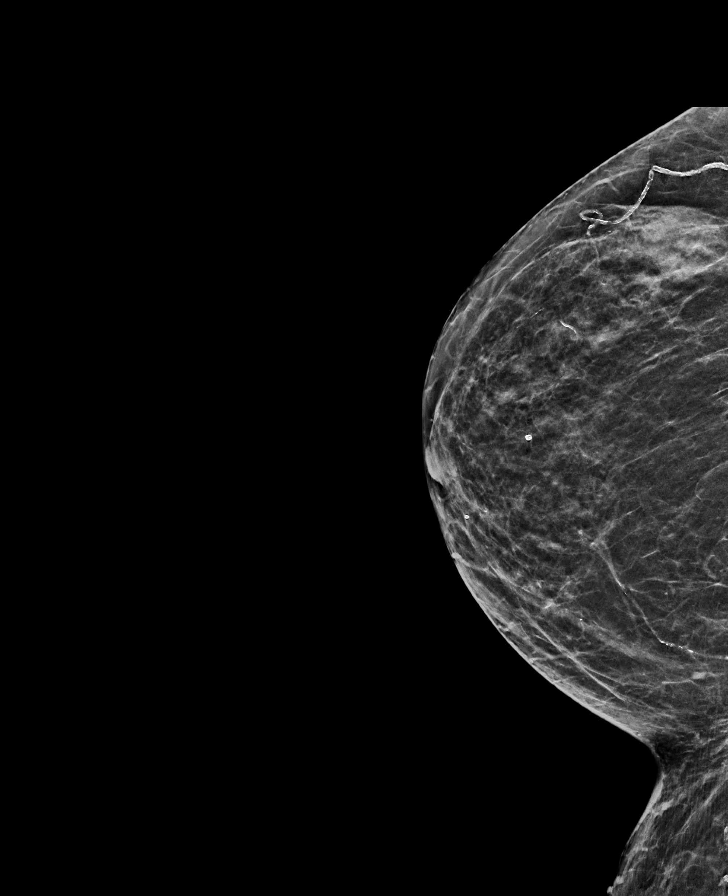

[R MLO synth-2D]
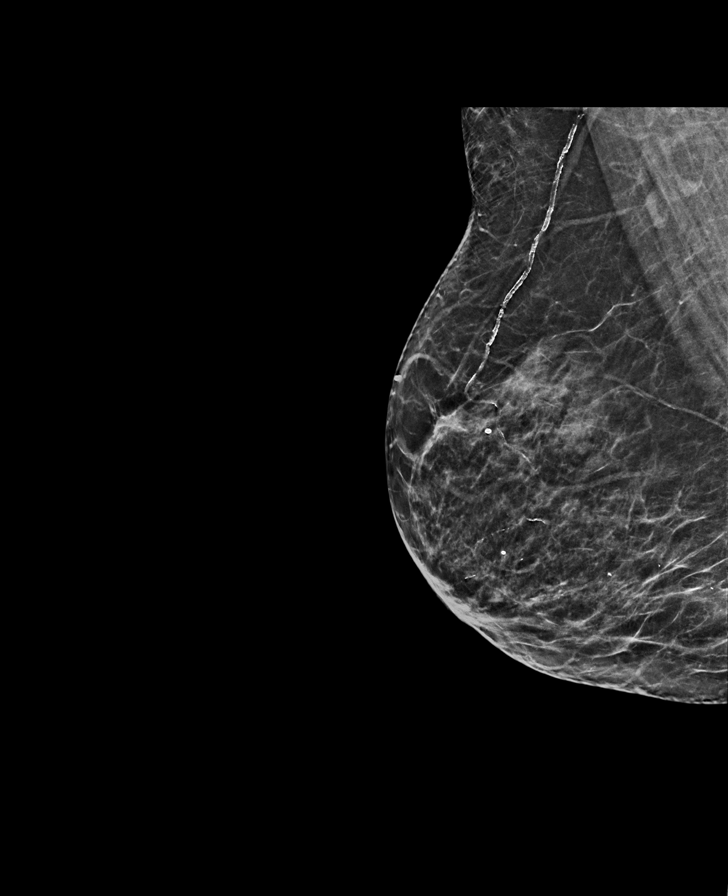

[L MLO synth-2D]
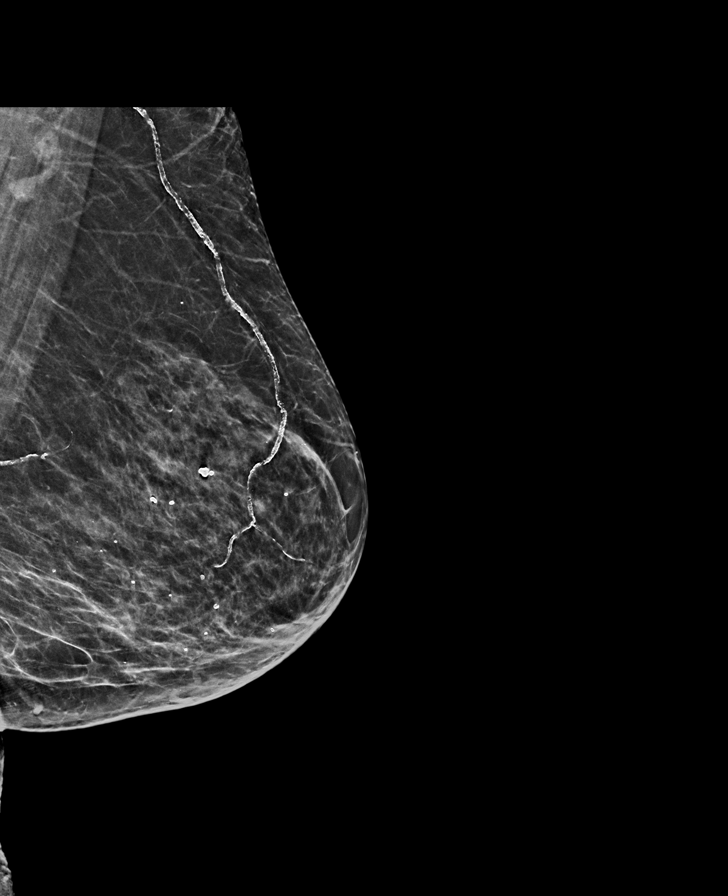

[L CC tomo · tomo slice 29/58.0]
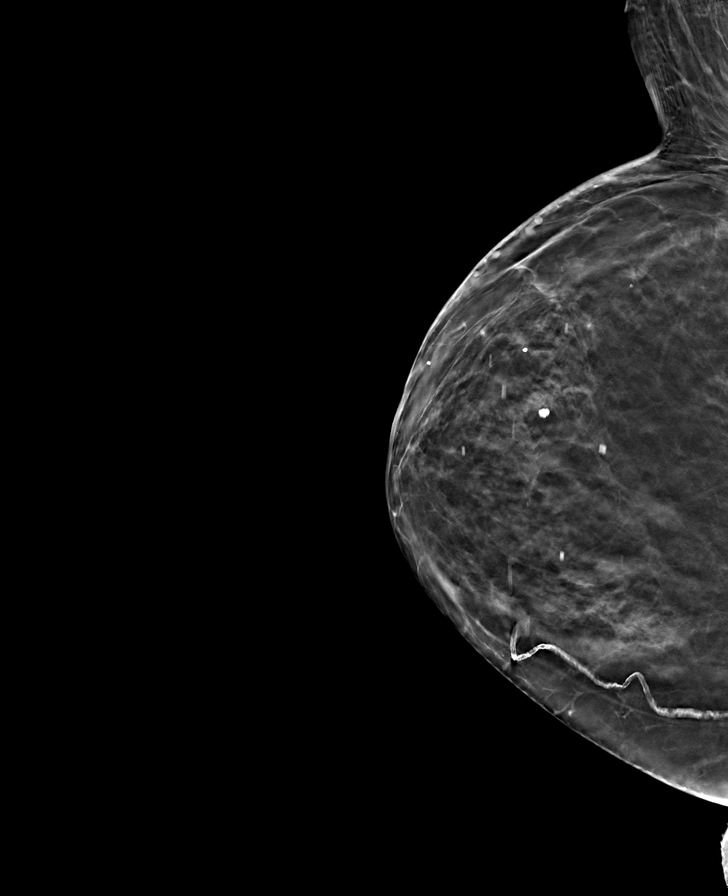

[L MLO tomo · tomo slice 31/61.0]
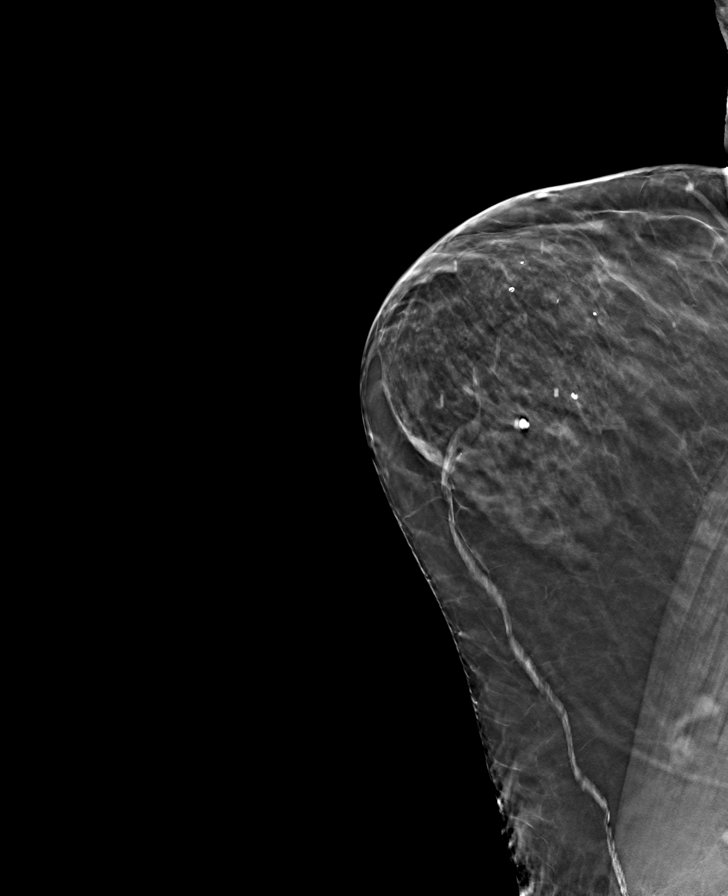

[R MLO tomo · tomo slice 31/60.0]
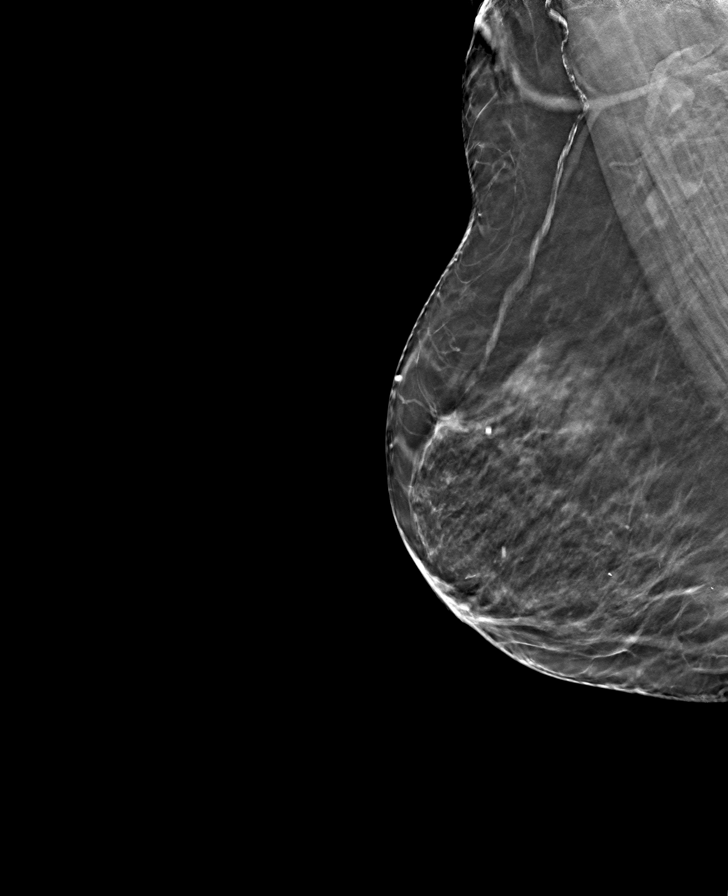

[R CC tomo · tomo slice 27/53.0]
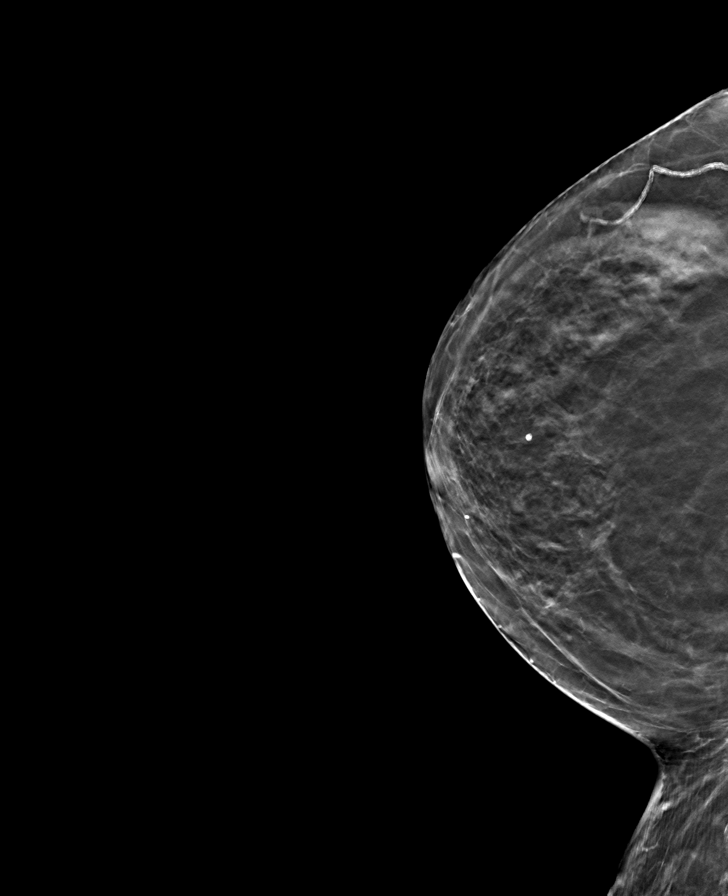

[8 of 24 positions shown; findings below may reference images not displayed]

ACR Breast Density Category b: There are scattered areas of
fibroglandular density.
FINDINGS: There are no findings suspicious for malignancy. Images were
processed with CAD.
IMPRESSION: No mammographic evidence of malignancy. A result letter of this
screening mammogram will be mailed directly to the patient.

RECOMMENDATION:
Screening mammogram in one year. (Code:CN-U-775)

BI-RADS CATEGORY  1: Negative.

## 2019-06-23 ENCOUNTER — Encounter: Payer: Self-pay | Admitting: Internal Medicine

## 2019-06-23 ENCOUNTER — Ambulatory Visit: Payer: PPO | Admitting: Internal Medicine

## 2019-06-23 ENCOUNTER — Other Ambulatory Visit: Payer: Self-pay

## 2019-06-23 VITALS — BP 142/65 | HR 68 | Temp 95.1°F | Resp 16 | Ht <= 58 in | Wt 138.0 lb

## 2019-06-23 DIAGNOSIS — I10 Essential (primary) hypertension: Secondary | ICD-10-CM | POA: Diagnosis not present

## 2019-06-23 DIAGNOSIS — J449 Chronic obstructive pulmonary disease, unspecified: Secondary | ICD-10-CM

## 2019-06-23 DIAGNOSIS — K219 Gastro-esophageal reflux disease without esophagitis: Secondary | ICD-10-CM | POA: Diagnosis not present

## 2019-06-23 NOTE — Progress Notes (Signed)
[facility] [place], [place] [address]  Pulmonary Sleep Medicine   Office Visit Note  Patient Name: Brenda Tucker DOB: 12/27/1933 MRN [id number]  Date of Service: 06/23/2019  Complaints/HPI: Pt is here for pulmonary follow up. She has been using her advair and proair inhalers as prescribed.  She reports ongoing sob.  She reports she becomes sob with small tasks.  She denies any recent illness or hospitalizations.      ROS  General: (-) fever, (-) chills, (-) night sweats, (-) weakness Skin: (-) rashes, (-) itching,. Eyes: (-) visual changes, (-) redness, (-) itching. Nose and Sinuses: (-) nasal stuffiness or itchiness, (-) postnasal drip, (-) nosebleeds, (-) sinus trouble. Mouth and Throat: (-) sore throat, (-) hoarseness. Neck: (-) swollen glands, (-) enlarged thyroid, (-) neck pain. Respiratory: - cough, (-) bloody sputum, - shortness of breath, - wheezing. Cardiovascular: - ankle swelling, (-) chest pain. Lymphatic: (-) lymph node enlargement. Neurologic: (-) numbness, (-) tingling. Psychiatric: (-) anxiety, (-) depression   Current Medication: Outpatient Encounter Medications as of 06/23/2019  Medication Sig  . Albuterol Sulfate (PROAIR RESPICLICK) 034 (90 Base) MCG/ACT AEPB Inhale 2 puffs into the lungs every 4 (four) hours as needed.  Marland Kitchen amLODipine (NORVASC) 5 MG tablet Take 1 tablet (5 mg total) by mouth daily.  Marland Kitchen aspirin EC 81 MG tablet Take 81 mg by mouth daily.  . carvedilol (COREG) 12.5 MG tablet Take 1 tablet (12.5 mg total) by mouth 2 (two) times daily with a meal.  . Cholecalciferol (VITAMIN D PO) Take by mouth.  . Fluticasone-Salmeterol (ADVAIR DISKUS) 250-50 MCG/DOSE AEPB Inhale 1 puff into the lungs 2 (two) times daily.  Marland Kitchen levothyroxine (SYNTHROID, LEVOTHROID) 50 MCG tablet Take 1 tablet (50 mcg total) by mouth daily before breakfast.  . losartan (COZAAR) 100 MG tablet Take 100 mg by mouth daily.  . vitamin B-12 (CYANOCOBALAMIN) 1000  MCG tablet Take 1,000 mcg by mouth daily.   No facility-administered encounter medications on file as of 06/23/2019.    Surgical History: Past Surgical History:  Procedure Laterality Date  . ABDOMINAL HYSTERECTOMY    . APPENDECTOMY    . COLONOSCOPY WITH PROPOFOL N/A 07/09/2015   Procedure: COLONOSCOPY WITH PROPOFOL;  Surgeon: Manya Silvas, MD;  Location: [facility] ENDOSCOPY;  Service: Endoscopy;  Laterality: N/A;    Medical History: Past Medical History:  Diagnosis Date  . Arthritis   . Asthma   . Chronic kidney disease   . COPD (chronic obstructive pulmonary disease) ([place])   . Hypertension   . Hypothyroidism     Family History: Family History  Family history unknown: Yes    Social History: Social History   Socioeconomic History  . Marital status: Widowed    Spouse name: Not on file  . Number of children: Not on file  . Years of education: Not on file  . Highest education level: Not on file  Occupational History  . Not on file  Tobacco Use  . Smoking status: Never Smoker  . Smokeless tobacco: Never Used  Substance and Sexual Activity  . Alcohol use: No  . Drug use: No  . Sexual activity: Not on file  Other Topics Concern  . Not on file  Social History Narrative  . Not on file   Social Determinants of Health   Financial Resource Strain:   . Difficulty of Paying Living Expenses: Not on file  Food Insecurity:   . Worried About Charity fundraiser in the Last  Year: Not on file  . Ran Out of Food in the Last Year: Not on file  Transportation Needs:   . Lack of Transportation (Medical): Not on file  . Lack of Transportation (Non-Medical): Not on file  Physical Activity:   . Days of Exercise per Week: Not on file  . Minutes of Exercise per Session: Not on file  Stress:   . Feeling of Stress : Not on file  Social Connections:   . Frequency of Communication with Friends and Family: Not on file  . Frequency of Social Gatherings with Friends and Family: Not on  file  . Attends Religious Services: Not on file  . Active Member of Clubs or Organizations: Not on file  . Attends Archivist Meetings: Not on file  . Marital Status: Not on file  Intimate Partner Violence:   . Fear of Current or Ex-Partner: Not on file  . Emotionally Abused: Not on file  . Physically Abused: Not on file  . Sexually Abused: Not on file    Vital Signs: Blood pressure (!) 142/65, pulse 68, temperature (!) 95.1 F (35.1 C), resp. rate 16, height 4\' 8"  (1.422 m), weight 138 lb (62.6 kg), SpO2 94 %.  Examination: General Appearance: The patient is well-developed, well-nourished, and in no distress. Skin: Gross inspection of skin unremarkable. Head: normocephalic, no gross deformities. Eyes: no gross deformities noted. ENT: ears appear grossly normal no exudates. Neck: Supple. No thyromegaly. No LAD. Respiratory: clear bilateraly. Cardiovascular: Normal S1 and S2 without murmur or rub. Extremities: No cyanosis. pulses are equal. Neurologic: Alert and oriented. No involuntary movements.  LABS: No results found for this or any previous visit (from the past 2160 hour(s)).  Radiology: MM 3D SCREEN BREAST BILATERAL  Result Date: 01/31/2019 CLINICAL DATA:  Screening. EXAM: DIGITAL SCREENING BILATERAL MAMMOGRAM WITH TOMO AND CAD COMPARISON:  Previous exam(s). ACR Breast Density Category c: The breast tissue is heterogeneously dense, which [place] obscure small masses. FINDINGS: There are no findings suspicious for malignancy. Images were processed with CAD. IMPRESSION: No mammographic evidence of malignancy. A result letter of this screening mammogram will be mailed directly to the patient. RECOMMENDATION: Screening mammogram in one year. (Code:SM-B-01Y) BI-RADS CATEGORY  1: Negative. Electronically Signed   By: Fidela Salisbury M.D.   On: 01/31/2019 13:01    No results found.  No results found.    Assessment and Plan: Patient Active Problem List   Diagnosis  Date Noted  . Urinary tract infection with hematuria 12/20/2018  . Low back pain 12/20/2018  . Bilateral carotid artery stenosis 06/19/2018  . Cellulitis of finger of right hand 03/13/2018  . Encounter for general adult medical examination with abnormal findings 12/18/2017  . Vitamin D deficiency 12/18/2017  . Dysuria 12/18/2017  . Chronic obstructive pulmonary disease ([place]) 08/17/2017  . SOB (shortness of breath) 08/17/2017  . GERD (gastroesophageal reflux disease) 08/17/2017  . Hyperlipidemia 08/17/2017  . Hypertension 08/17/2017  . Hypothyroidism 08/17/2017  . Osteoarthritis 08/17/2017    1. Chronic obstructive pulmonary disease, unspecified COPD type ([place]) Pt will stop advair, and start Trelegy.  2 week sample, and free 4 months RX given to patient. Will follow up in 4 weeks.   2. Hypertension, unspecified type slighltly elevated, continue to monitor. 142/65  3. Gastroesophageal reflux disease without esophagitis Controlled, continue meds.    General Counseling: I have discussed the findings of the evaluation and examination with [facility].  I have also discussed any further diagnostic evaluation thatmay be needed  or ordered today. Ramie verbalizes understanding of the findings of todays visit. We also reviewed her medications today and discussed drug interactions and side effects including but not limited excessive drowsiness and altered mental states. We also discussed that there is always a risk not just to her but also people around her. she has been encouraged to call the office with any questions or concerns that should arise related to todays visit.  No orders of the defined types were placed in this encounter.    Time spent: 25 This patient was seen by Orson Gear AGNP-C in Collaboration with Dr. Devona Konig as a part of collaborative care agreement.   I have personally obtained a history, examined the patient, evaluated laboratory and imaging results, formulated the  assessment and plan and placed orders.    Allyne Gee, MD [facility] Pulmonary and Critical Care Sleep medicine

## 2019-06-26 ENCOUNTER — Telehealth: Payer: Self-pay

## 2019-06-26 NOTE — Telephone Encounter (Signed)
CONFIRMED AND SCREENED FOR 06-30-19 OV. 

## 2019-06-30 ENCOUNTER — Other Ambulatory Visit: Payer: Self-pay

## 2019-06-30 ENCOUNTER — Ambulatory Visit (INDEPENDENT_AMBULATORY_CARE_PROVIDER_SITE_OTHER): Payer: PPO | Admitting: Nurse Practitioner

## 2019-06-30 ENCOUNTER — Encounter: Payer: Self-pay | Admitting: Nurse Practitioner

## 2019-06-30 VITALS — BP 154/67 | HR 68 | Resp 16 | Ht <= 58 in | Wt 139.2 lb

## 2019-06-30 DIAGNOSIS — N183 Chronic kidney disease, stage 3 unspecified: Secondary | ICD-10-CM | POA: Diagnosis not present

## 2019-06-30 DIAGNOSIS — N1832 Chronic kidney disease, stage 3b: Secondary | ICD-10-CM | POA: Insufficient documentation

## 2019-06-30 DIAGNOSIS — I10 Essential (primary) hypertension: Secondary | ICD-10-CM | POA: Diagnosis not present

## 2019-06-30 DIAGNOSIS — E785 Hyperlipidemia, unspecified: Secondary | ICD-10-CM

## 2019-06-30 DIAGNOSIS — J449 Chronic obstructive pulmonary disease, unspecified: Secondary | ICD-10-CM | POA: Diagnosis not present

## 2019-06-30 MED ORDER — TRELEGY ELLIPTA 100-62.5-25 MCG/INH IN AEPB: 1.0000 | INHALATION_SPRAY | Freq: Every day | RESPIRATORY_TRACT | 0 refills | Status: DC

## 2019-06-30 NOTE — Progress Notes (Signed)
[facility] [place], [place] [address]  Internal MEDICINE  Office Visit Note  Patient Name: Brenda Tucker  [id number]  [id number]  Date of Service: 06/30/2019  Chief Complaint  Patient presents with  . Hypertension  . Hypothyroidism    The patient is here for routine follow up visit. She states that she is doing well. She states that she is feeling better and breathing better now that she has started Trelegy from advair. Her blood pressure is mildly elevated, and she tends to run slightly elevated, especially when first arriving in the office. She does have some swelling in both lower legs and ankles. She is at her baseline. She is scheduled to have routine blood work done on 07/21/2019. She wwill follow up with her nephrologist 1/0/2021. She has no new concerns or complaints today. She did have flu shot as well as TDAP booster in November at her pharmacy.       Current Medication: Outpatient Encounter Medications as of 06/30/2019  Medication Sig  . Albuterol Sulfate (PROAIR RESPICLICK) 818 (90 Base) MCG/ACT AEPB Inhale 2 puffs into the lungs every 4 (four) hours as needed.  Marland Kitchen amLODipine (NORVASC) 5 MG tablet Take 1 tablet (5 mg total) by mouth daily.  Marland Kitchen aspirin EC 81 MG tablet Take 81 mg by mouth daily.  . carvedilol (COREG) 12.5 MG tablet Take 1 tablet (12.5 mg total) by mouth 2 (two) times daily with a meal.  . Cholecalciferol (VITAMIN D PO) Take by mouth.  . levothyroxine (SYNTHROID, LEVOTHROID) 50 MCG tablet Take 1 tablet (50 mcg total) by mouth daily before breakfast.  . losartan (COZAAR) 100 MG tablet Take 100 mg by mouth daily.  . vitamin B-12 (CYANOCOBALAMIN) 1000 MCG tablet Take 1,000 mcg by mouth daily.  . Fluticasone-Umeclidin-Vilant (TRELEGY ELLIPTA) 100-62.5-25 MCG/INH AEPB Inhale 1 puff into the lungs daily.  . [DISCONTINUED] Fluticasone-Salmeterol (ADVAIR DISKUS) 250-50 MCG/DOSE AEPB Inhale 1 puff into the lungs 2 (two) times daily. (Patient not  taking: Reported on 06/30/2019)   No facility-administered encounter medications on file as of 06/30/2019.    Surgical History: Past Surgical History:  Procedure Laterality Date  . ABDOMINAL HYSTERECTOMY    . APPENDECTOMY    . COLONOSCOPY WITH PROPOFOL N/A 07/09/2015   Procedure: COLONOSCOPY WITH PROPOFOL;  Surgeon: Manya Silvas, MD;  Location: [facility] ENDOSCOPY;  Service: Endoscopy;  Laterality: N/A;    Medical History: Past Medical History:  Diagnosis Date  . Arthritis   . Asthma   . Chronic kidney disease   . COPD (chronic obstructive pulmonary disease) ([place])   . Hypertension   . Hypothyroidism     Family History: Family History  Family history unknown: Yes    Social History   Socioeconomic History  . Marital status: Widowed    Spouse name: Not on file  . Number of children: Not on file  . Years of education: Not on file  . Highest education level: Not on file  Occupational History  . Not on file  Tobacco Use  . Smoking status: Never Smoker  . Smokeless tobacco: Never Used  Substance and Sexual Activity  . Alcohol use: No  . Drug use: No  . Sexual activity: Not on file  Other Topics Concern  . Not on file  Social History Narrative  . Not on file   Social Determinants of Health   Financial Resource Strain:   . Difficulty of Paying Living Expenses: Not on file  Food Insecurity:   .  Worried About Charity fundraiser in the Last Year: Not on file  . Ran Out of Food in the Last Year: Not on file  Transportation Needs:   . Lack of Transportation (Medical): Not on file  . Lack of Transportation (Non-Medical): Not on file  Physical Activity:   . Days of Exercise per Week: Not on file  . Minutes of Exercise per Session: Not on file  Stress:   . Feeling of Stress : Not on file  Social Connections:   . Frequency of Communication with Friends and Family: Not on file  . Frequency of Social Gatherings with Friends and Family: Not on file  . Attends Religious  Services: Not on file  . Active Member of Clubs or Organizations: Not on file  . Attends Archivist Meetings: Not on file  . Marital Status: Not on file  Intimate Partner Violence:   . Fear of Current or Ex-Partner: Not on file  . Emotionally Abused: Not on file  . Physically Abused: Not on file  . Sexually Abused: Not on file      Review of Systems  Constitutional: Positive for fatigue. Negative for activity change, chills and unexpected weight change.  HENT: Negative for congestion, postnasal drip, rhinorrhea, sneezing and sore throat.   Respiratory: Positive for shortness of breath. Negative for cough, chest tightness and wheezing.        Improved shortness of breath since starting on trelegy from advair.   Cardiovascular: Positive for leg swelling. Negative for chest pain and palpitations.  Gastrointestinal: Negative for abdominal pain, constipation, diarrhea, nausea and vomiting.  Endocrine: Negative for cold intolerance, heat intolerance, polydipsia and polyuria.  Musculoskeletal: Positive for arthralgias and back pain. Negative for joint swelling and neck pain.  Skin: Negative for rash.  Allergic/Immunologic: Positive for environmental allergies.  Neurological: Negative for dizziness, tremors, light-headedness, numbness and headaches.  Hematological: Negative for adenopathy. Does not bruise/bleed easily.  Psychiatric/Behavioral: Negative for behavioral problems (Depression), dysphoric mood, sleep disturbance and suicidal ideas. The patient is not nervous/anxious.     Today's Vitals   06/30/19 1138  BP: (!) 154/67  Pulse: 68  Resp: 16  SpO2: 97%  Weight: 139 lb 3.2 oz (63.1 kg)  Height: 4\' 8"  (1.422 m)   Body mass index is 31.21 kg/m.  Physical Exam Vitals and nursing note reviewed.  Constitutional:      General: She is not in acute distress.    Appearance: Normal appearance. She is well-developed. She is not diaphoretic.  HENT:     Head: Normocephalic  and atraumatic.     Mouth/Throat:     Pharynx: No oropharyngeal exudate.  Eyes:     Pupils: Pupils are equal, round, and reactive to light.  Neck:     Thyroid: No thyromegaly.     Vascular: No carotid bruit or JVD.     Trachea: No tracheal deviation.  Cardiovascular:     Rate and Rhythm: Normal rate and regular rhythm.     Heart sounds: Murmur present. No friction rub. No gallop.      Comments: Mild dependant edema in bilateral lower extremities.  Pulmonary:     Effort: Pulmonary effort is normal. No respiratory distress.     Breath sounds: Normal breath sounds. No wheezing or rales.  Chest:     Chest wall: No tenderness.  Abdominal:     Palpations: Abdomen is soft.  Musculoskeletal:        General: Normal range of motion.  Cervical back: Normal range of motion and neck supple.     Comments: Mild low back pain when going from siting to standing position. No palpable or visible abnormalities present at this time.   Lymphadenopathy:     Cervical: No cervical adenopathy.  Skin:    General: Skin is warm and dry.  Neurological:     Mental Status: She is alert and oriented to person, place, and time.     Cranial Nerves: No cranial nerve deficit.  Psychiatric:        Behavior: Behavior normal.        Thought Content: Thought content normal.        Judgment: Judgment normal.    Assessment/Plan: 1. Chronic obstructive pulmonary disease, unspecified COPD type ([place]) Improved. Continue trelegy daily. Use rescue inhaler as needed and as prescribed.  - Fluticasone-Umeclidin-Vilant (TRELEGY ELLIPTA) 100-62.5-25 MCG/INH AEPB; Inhale 1 puff into the lungs daily.  Dispense: 1 each; Refill: 0  2. Hypertension, unspecified type Generally stable. No changes made today to bp medications.   3. Stage 3 chronic kidney disease, unspecified whether stage 3a or 3b CKD Labs ordered her nephrology. Routine visits as scheduled.     General Counseling: Arianna verbalizes understanding of the  findings of todays visit and agrees with plan of treatment. I have discussed any further diagnostic evaluation that [place] be needed or ordered today. We also reviewed her medications today. she has been encouraged to call the office with any questions or concerns that should arise related to todays visit.   Hypertension Counseling:   The following hypertensive lifestyle modification were recommended and discussed:  1. Limiting alcohol intake to less than 1 oz/day of ethanol:(24 oz of beer or 8 oz of wine or 2 oz of 100-proof whiskey). 2. Take baby ASA 81 mg daily. 3. Importance of regular aerobic exercise and losing weight. 4. Reduce dietary saturated fat and cholesterol intake for overall cardiovascular health. 5. Maintaining adequate dietary potassium, calcium, and magnesium intake. 6. Regular monitoring of the blood pressure. 7. Reduce sodium intake to less than 100 mmol/day (less than 2.3 gm of sodium or less than 6 gm of sodium choride)   This patient was seen by [place] with Dr Lavera Guise as a part of collaborative care agreement  Meds ordered this encounter  Medications  . Fluticasone-Umeclidin-Vilant (TRELEGY ELLIPTA) 100-62.5-25 MCG/INH AEPB    Sig: Inhale 1 puff into the lungs daily.    Dispense:  1 each    Refill:  0    Samples were provided at last visit.    Order Specific Question:   Supervising Provider    Answer:   Lavera Guise [[id number]]    Time spent: 60 Minutes      Dr Lavera Guise Internal medicine

## 2019-07-17 ENCOUNTER — Telehealth: Payer: Self-pay

## 2019-07-17 ENCOUNTER — Other Ambulatory Visit: Payer: Self-pay

## 2019-07-17 DIAGNOSIS — I129 Hypertensive chronic kidney disease with stage 1 through stage 4 chronic kidney disease, or unspecified chronic kidney disease: Secondary | ICD-10-CM | POA: Diagnosis not present

## 2019-07-17 DIAGNOSIS — E785 Hyperlipidemia, unspecified: Secondary | ICD-10-CM | POA: Diagnosis not present

## 2019-07-17 DIAGNOSIS — N183 Chronic kidney disease, stage 3 unspecified: Secondary | ICD-10-CM | POA: Diagnosis not present

## 2019-07-17 DIAGNOSIS — I10 Essential (primary) hypertension: Secondary | ICD-10-CM | POA: Diagnosis not present

## 2019-07-17 DIAGNOSIS — N281 Cyst of kidney, acquired: Secondary | ICD-10-CM | POA: Diagnosis not present

## 2019-07-17 NOTE — Telephone Encounter (Signed)
Called confirmed appointment with patient. klh

## 2019-07-21 ENCOUNTER — Ambulatory Visit: Payer: PPO | Admitting: Internal Medicine

## 2019-07-21 ENCOUNTER — Other Ambulatory Visit: Payer: Self-pay

## 2019-07-21 ENCOUNTER — Other Ambulatory Visit: Payer: Self-pay | Admitting: Adult Health

## 2019-07-21 ENCOUNTER — Encounter: Payer: Self-pay | Admitting: Internal Medicine

## 2019-07-21 VITALS — BP 129/65 | HR 67 | Temp 96.9°F | Resp 16 | Ht <= 58 in | Wt 137.0 lb

## 2019-07-21 DIAGNOSIS — J449 Chronic obstructive pulmonary disease, unspecified: Secondary | ICD-10-CM

## 2019-07-21 DIAGNOSIS — K219 Gastro-esophageal reflux disease without esophagitis: Secondary | ICD-10-CM

## 2019-07-21 DIAGNOSIS — R0602 Shortness of breath: Secondary | ICD-10-CM

## 2019-07-21 DIAGNOSIS — I10 Essential (primary) hypertension: Secondary | ICD-10-CM

## 2019-07-21 MED ORDER — TRELEGY ELLIPTA 100-62.5-25 MCG/INH IN AEPB: 1.0000 | INHALATION_SPRAY | Freq: Every day | RESPIRATORY_TRACT | 3 refills | Status: DC

## 2019-07-21 NOTE — Progress Notes (Signed)
[facility] [place], [place] [address]  Pulmonary Sleep Medicine   Office Visit Note  Patient Name: Brenda Tucker DOB: Feb 05, 1934 MRN [id number]  Date of Service: 07/21/2019  Complaints/HPI: Pt is here for pulmonary follow up.  She has been on trelegy for about 4 weeks and is doing very well.  She reports much improvement in her DOE.  She is able to do things now without shortness of breath and she is very happy.  Denies any further issues or ongoing need.    ROS  General: (-) fever, (-) chills, (-) night sweats, (-) weakness Skin: (-) rashes, (-) itching,. Eyes: (-) visual changes, (-) redness, (-) itching. Nose and Sinuses: (-) nasal stuffiness or itchiness, (-) postnasal drip, (-) nosebleeds, (-) sinus trouble. Mouth and Throat: (-) sore throat, (-) hoarseness. Neck: (-) swollen glands, (-) enlarged thyroid, (-) neck pain. Respiratory: - cough, (-) bloody sputum, - shortness of breath, - wheezing. Cardiovascular: - ankle swelling, (-) chest pain. Lymphatic: (-) lymph node enlargement. Neurologic: (-) numbness, (-) tingling. Psychiatric: (-) anxiety, (-) depression   Current Medication: Outpatient Encounter Medications as of 07/21/2019  Medication Sig  . Albuterol Sulfate (PROAIR RESPICLICK) 518 (90 Base) MCG/ACT AEPB Inhale 2 puffs into the lungs every 4 (four) hours as needed.  Marland Kitchen amLODipine (NORVASC) 5 MG tablet Take 1 tablet (5 mg total) by mouth daily.  Marland Kitchen aspirin EC 81 MG tablet Take 81 mg by mouth daily.  . carvedilol (COREG) 12.5 MG tablet Take 1 tablet (12.5 mg total) by mouth 2 (two) times daily with a meal.  . Cholecalciferol (VITAMIN D PO) Take by mouth.  . Fluticasone-Umeclidin-Vilant (TRELEGY ELLIPTA) 100-62.5-25 MCG/INH AEPB Inhale 1 puff into the lungs daily.  Marland Kitchen levothyroxine (SYNTHROID, LEVOTHROID) 50 MCG tablet Take 1 tablet (50 mcg total) by mouth daily before breakfast.  . losartan (COZAAR) 100 MG tablet Take 100 mg by mouth daily.  .  vitamin B-12 (CYANOCOBALAMIN) 1000 MCG tablet Take 1,000 mcg by mouth daily.   No facility-administered encounter medications on file as of 07/21/2019.    Surgical History: Past Surgical History:  Procedure Laterality Date  . ABDOMINAL HYSTERECTOMY    . APPENDECTOMY    . COLONOSCOPY WITH PROPOFOL N/A 07/09/2015   Procedure: COLONOSCOPY WITH PROPOFOL;  Surgeon: Manya Silvas, MD;  Location: [facility] ENDOSCOPY;  Service: Endoscopy;  Laterality: N/A;    Medical History: Past Medical History:  Diagnosis Date  . Arthritis   . Asthma   . Chronic kidney disease   . COPD (chronic obstructive pulmonary disease) ([place])   . Hypertension   . Hypothyroidism     Family History: Family History  Family history unknown: Yes    Social History: Social History   Socioeconomic History  . Marital status: Widowed    Spouse name: Not on file  . Number of children: Not on file  . Years of education: Not on file  . Highest education level: Not on file  Occupational History  . Not on file  Tobacco Use  . Smoking status: Never Smoker  . Smokeless tobacco: Never Used  Substance and Sexual Activity  . Alcohol use: No  . Drug use: No  . Sexual activity: Not on file  Other Topics Concern  . Not on file  Social History Narrative  . Not on file   Social Determinants of Health   Financial Resource Strain:   . Difficulty of Paying Living Expenses: Not on file  Food Insecurity:   .  Worried About Charity fundraiser in the Last Year: Not on file  . Ran Out of Food in the Last Year: Not on file  Transportation Needs:   . Lack of Transportation (Medical): Not on file  . Lack of Transportation (Non-Medical): Not on file  Physical Activity:   . Days of Exercise per Week: Not on file  . Minutes of Exercise per Session: Not on file  Stress:   . Feeling of Stress : Not on file  Social Connections:   . Frequency of Communication with Friends and Family: Not on file  . Frequency of Social  Gatherings with Friends and Family: Not on file  . Attends Religious Services: Not on file  . Active Member of Clubs or Organizations: Not on file  . Attends Archivist Meetings: Not on file  . Marital Status: Not on file  Intimate Partner Violence:   . Fear of Current or Ex-Partner: Not on file  . Emotionally Abused: Not on file  . Physically Abused: Not on file  . Sexually Abused: Not on file    Vital Signs: Blood pressure 129/65, pulse 67, temperature (!) 96.9 F (36.1 C), resp. rate 16, height 4\' 8"  (1.422 m), weight 137 lb (62.1 kg), SpO2 97 %.  Examination: General Appearance: The patient is well-developed, well-nourished, and in no distress. Skin: Gross inspection of skin unremarkable. Head: normocephalic, no gross deformities. Eyes: no gross deformities noted. ENT: ears appear grossly normal no exudates. Neck: Supple. No thyromegaly. No LAD. Respiratory: clear bilaterally. Cardiovascular: Normal S1 and S2 without murmur or rub. Extremities: No cyanosis. pulses are equal. Neurologic: Alert and oriented. No involuntary movements.  LABS: No results found for this or any previous visit (from the past 2160 hour(s)).  Radiology: MM 3D SCREEN BREAST BILATERAL  Result Date: 01/31/2019 CLINICAL DATA:  Screening. EXAM: DIGITAL SCREENING BILATERAL MAMMOGRAM WITH TOMO AND CAD COMPARISON:  Previous exam(s). ACR Breast Density Category c: The breast tissue is heterogeneously dense, which Pella obscure small masses. FINDINGS: There are no findings suspicious for malignancy. Images were processed with CAD. IMPRESSION: No mammographic evidence of malignancy. A result letter of this screening mammogram will be mailed directly to the patient. RECOMMENDATION: Screening mammogram in one year. (Code:SM-B-01Y) BI-RADS CATEGORY  1: Negative. Electronically Signed   By: Fidela Salisbury M.D.   On: 01/31/2019 13:01    No results found.  No results found.    Assessment and  Plan: Patient Active Problem List   Diagnosis Date Noted  . Stage 3 chronic kidney disease 06/30/2019  . Urinary tract infection with hematuria 12/20/2018  . Low back pain 12/20/2018  . Bilateral carotid artery stenosis 06/19/2018  . Cellulitis of finger of right hand 03/13/2018  . Encounter for general adult medical examination with abnormal findings 12/18/2017  . Vitamin D deficiency 12/18/2017  . Dysuria 12/18/2017  . Chronic obstructive pulmonary disease ([place]) 08/17/2017  . SOB (shortness of breath) 08/17/2017  . GERD (gastroesophageal reflux disease) 08/17/2017  . Hyperlipidemia 08/17/2017  . Hypertension 08/17/2017  . Hypothyroidism 08/17/2017  . Osteoarthritis 08/17/2017    1. Chronic obstructive pulmonary disease, unspecified COPD type ([place]) Continue to use Trelegy, FEV1 and symptoms are much improved.  - Fluticasone-Umeclidin-Vilant (TRELEGY ELLIPTA) 100-62.5-25 MCG/INH AEPB; Inhale 1 puff into the lungs daily.  Dispense: 1 each; Refill: 3  2. Hypertension, unspecified type Controlled, continue present management.  3. Gastroesophageal reflux disease without esophagitis Stable, continue to use tums as discussed.   4. SOB (shortness of  breath) Much improve FEV1 on trelegy, currently 1.0.  - Spirometry with Graph  General Counseling: I have discussed the findings of the evaluation and examination with [facility].  I have also discussed any further diagnostic evaluation thatmay be needed or ordered today. Tenea verbalizes understanding of the findings of todays visit. We also reviewed her medications today and discussed drug interactions and side effects including but not limited excessive drowsiness and altered mental states. We also discussed that there is always a risk not just to her but also people around her. she has been encouraged to call the office with any questions or concerns that should arise related to todays visit.  Orders Placed This Encounter  Procedures  .  Spirometry with Graph    Order Specific Question:   Where should this test be performed?    Answer:   [facility]     Time spent: 25 minutes  I have personally obtained a history, examined the patient, evaluated laboratory and imaging results, formulated the assessment and plan and placed orders.    Allyne Gee, MD [facility] Pulmonary and Critical Care Sleep medicine

## 2019-07-23 DIAGNOSIS — I129 Hypertensive chronic kidney disease with stage 1 through stage 4 chronic kidney disease, or unspecified chronic kidney disease: Secondary | ICD-10-CM | POA: Diagnosis not present

## 2019-07-23 DIAGNOSIS — R319 Hematuria, unspecified: Secondary | ICD-10-CM | POA: Diagnosis not present

## 2019-07-23 DIAGNOSIS — N1832 Chronic kidney disease, stage 3b: Secondary | ICD-10-CM | POA: Diagnosis not present

## 2019-07-23 DIAGNOSIS — R809 Proteinuria, unspecified: Secondary | ICD-10-CM | POA: Diagnosis not present

## 2019-07-24 ENCOUNTER — Other Ambulatory Visit: Payer: Self-pay

## 2019-07-24 DIAGNOSIS — J449 Chronic obstructive pulmonary disease, unspecified: Secondary | ICD-10-CM

## 2019-07-24 MED ORDER — TRELEGY ELLIPTA 100-62.5-25 MCG/INH IN AEPB: 1.0000 | INHALATION_SPRAY | Freq: Every day | RESPIRATORY_TRACT | 3 refills | Status: DC

## 2019-07-29 ENCOUNTER — Other Ambulatory Visit: Payer: Self-pay

## 2019-07-29 DIAGNOSIS — J449 Chronic obstructive pulmonary disease, unspecified: Secondary | ICD-10-CM

## 2019-07-29 MED ORDER — TRELEGY ELLIPTA 100-62.5-25 MCG/INH IN AEPB: 1.0000 | INHALATION_SPRAY | Freq: Every day | RESPIRATORY_TRACT | 3 refills | Status: DC

## 2019-07-30 ENCOUNTER — Other Ambulatory Visit: Payer: Self-pay

## 2019-07-30 DIAGNOSIS — E039 Hypothyroidism, unspecified: Secondary | ICD-10-CM

## 2019-07-30 DIAGNOSIS — I10 Essential (primary) hypertension: Secondary | ICD-10-CM

## 2019-07-30 MED ORDER — CARVEDILOL 12.5 MG PO TABS: 12.5000 mg | ORAL_TABLET | Freq: Two times a day (BID) | ORAL | 1 refills | Status: DC

## 2019-07-30 MED ORDER — LEVOTHYROXINE SODIUM 50 MCG PO TABS: 50.0000 ug | ORAL_TABLET | Freq: Every day | ORAL | 3 refills | Status: DC

## 2019-07-31 ENCOUNTER — Other Ambulatory Visit: Payer: Self-pay | Admitting: Adult Health

## 2019-07-31 DIAGNOSIS — J449 Chronic obstructive pulmonary disease, unspecified: Secondary | ICD-10-CM

## 2019-08-05 ENCOUNTER — Other Ambulatory Visit: Payer: Self-pay | Admitting: Adult Health

## 2019-08-05 ENCOUNTER — Encounter: Payer: Self-pay | Admitting: Adult Health

## 2019-08-05 DIAGNOSIS — J449 Chronic obstructive pulmonary disease, unspecified: Secondary | ICD-10-CM

## 2019-08-12 ENCOUNTER — Other Ambulatory Visit: Payer: Self-pay

## 2019-08-12 MED ORDER — ANORO ELLIPTA 62.5-25 MCG/INH IN AEPB: 1.0000 | INHALATION_SPRAY | Freq: Every day | RESPIRATORY_TRACT | 3 refills | Status: DC

## 2019-08-12 NOTE — Telephone Encounter (Signed)
Pt called and stated that trelegy is causing issues with sinuses, indigestion, jaw hurts, and face feels swollen in the mornings. Spoke with adam and sent in anoro

## 2019-08-20 DIAGNOSIS — H40023 Open angle with borderline findings, high risk, bilateral: Secondary | ICD-10-CM | POA: Diagnosis not present

## 2019-11-13 ENCOUNTER — Telehealth: Payer: Self-pay

## 2019-11-13 NOTE — Telephone Encounter (Signed)
Confirmed appointment on 11/17/2019 and screened for covid. klh

## 2019-11-13 NOTE — Telephone Encounter (Signed)
Called lmom informing patient of appointment on 11/17/2019. klh

## 2019-11-17 ENCOUNTER — Encounter: Payer: Self-pay | Admitting: Internal Medicine

## 2019-11-17 ENCOUNTER — Ambulatory Visit: Payer: PPO | Admitting: Internal Medicine

## 2019-11-17 ENCOUNTER — Other Ambulatory Visit: Payer: Self-pay

## 2019-11-17 VITALS — BP 129/61 | HR 70 | Temp 97.4°F | Resp 16 | Ht <= 58 in | Wt 136.0 lb

## 2019-11-17 DIAGNOSIS — J449 Chronic obstructive pulmonary disease, unspecified: Secondary | ICD-10-CM | POA: Diagnosis not present

## 2019-11-17 DIAGNOSIS — K219 Gastro-esophageal reflux disease without esophagitis: Secondary | ICD-10-CM

## 2019-11-17 DIAGNOSIS — R0602 Shortness of breath: Secondary | ICD-10-CM | POA: Diagnosis not present

## 2019-11-17 DIAGNOSIS — I10 Essential (primary) hypertension: Secondary | ICD-10-CM

## 2019-11-17 NOTE — Progress Notes (Signed)
[facility] [place], [place] [address]  Pulmonary Sleep Medicine   Office Visit Note  Patient Name: Brenda Tucker DOB: 21-Sep-1933 MRN [id number]  Date of Service: 11/17/2019  Complaints/HPI: Pt is here for Pulmonary follow up.  Overall she appears at baseline.  She denies any complaints. She has a history of COPD and GERD.  Her GERD is well controlled, as is her COPD.     ROS  General: (-) fever, (-) chills, (-) night sweats, (-) weakness Skin: (-) rashes, (-) itching,. Eyes: (-) visual changes, (-) redness, (-) itching. Nose and Sinuses: (-) nasal stuffiness or itchiness, (-) postnasal drip, (-) nosebleeds, (-) sinus trouble. Mouth and Throat: (-) sore throat, (-) hoarseness. Neck: (-) swollen glands, (-) enlarged thyroid, (-) neck pain. Respiratory: - cough, (-) bloody sputum, - shortness of breath, - wheezing. Cardiovascular: - ankle swelling, (-) chest pain. Lymphatic: (-) lymph node enlargement. Neurologic: (-) numbness, (-) tingling. Psychiatric: (-) anxiety, (-) depression   Current Medication: Outpatient Encounter Medications as of 11/17/2019  Medication Sig  . Albuterol Sulfate (PROAIR RESPICLICK) 672 (90 Base) MCG/ACT AEPB Inhale 2 puffs into the lungs every 4 (four) hours as needed.  Marland Kitchen amLODipine (NORVASC) 5 MG tablet Take 1 tablet (5 mg total) by mouth daily.  Marland Kitchen aspirin EC 81 MG tablet Take 81 mg by mouth daily.  . carvedilol (COREG) 12.5 MG tablet Take 1 tablet (12.5 mg total) by mouth 2 (two) times daily with a meal.  . Cholecalciferol (VITAMIN D PO) Take by mouth.  . levothyroxine (SYNTHROID) 50 MCG tablet Take 1 tablet (50 mcg total) by mouth daily before breakfast.  . losartan (COZAAR) 100 MG tablet Take 100 mg by mouth daily.  Marland Kitchen umeclidinium-vilanterol (ANORO ELLIPTA) 62.5-25 MCG/INH AEPB Inhale 1 puff into the lungs daily.  . vitamin B-12 (CYANOCOBALAMIN) 1000 MCG tablet Take 1,000 mcg by mouth daily.   No facility-administered  encounter medications on file as of 11/17/2019.    Surgical History: Past Surgical History:  Procedure Laterality Date  . ABDOMINAL HYSTERECTOMY    . APPENDECTOMY    . COLONOSCOPY WITH PROPOFOL N/A 07/09/2015   Procedure: COLONOSCOPY WITH PROPOFOL;  Surgeon: Manya Silvas, MD;  Location: [facility] ENDOSCOPY;  Service: Endoscopy;  Laterality: N/A;    Medical History: Past Medical History:  Diagnosis Date  . Arthritis   . Asthma   . Chronic kidney disease   . COPD (chronic obstructive pulmonary disease) ([place])   . Hypertension   . Hypothyroidism     Family History: Family History  Family history unknown: Yes    Social History: Social History   Socioeconomic History  . Marital status: Widowed    Spouse name: Not on file  . Number of children: Not on file  . Years of education: Not on file  . Highest education level: Not on file  Occupational History  . Not on file  Tobacco Use  . Smoking status: Never Smoker  . Smokeless tobacco: Never Used  Substance and Sexual Activity  . Alcohol use: No  . Drug use: No  . Sexual activity: Not on file  Other Topics Concern  . Not on file  Social History Narrative  . Not on file   Social Determinants of Health   Financial Resource Strain:   . Difficulty of Paying Living Expenses:   Food Insecurity:   . Worried About Charity fundraiser in the Last Year:   . [place] in the Last  Year:   Transportation Needs:   . Film/video editor (Medical):   Marland Kitchen Lack of Transportation (Non-Medical):   Physical Activity:   . Days of Exercise per Week:   . Minutes of Exercise per Session:   Stress:   . Feeling of Stress :   Social Connections:   . Frequency of Communication with Friends and Family:   . Frequency of Social Gatherings with Friends and Family:   . Attends Religious Services:   . Active Member of Clubs or Organizations:   . Attends Archivist Meetings:   Marland Kitchen Marital Status:   Intimate Partner Violence:   .  Fear of Current or Ex-Partner:   . Emotionally Abused:   Marland Kitchen Physically Abused:   . Sexually Abused:     Vital Signs: Blood pressure 129/61, pulse 70, temperature (!) 97.4 F (36.3 C), resp. rate 16, height 4\' 8"  (1.422 m), weight 136 lb (61.7 kg), SpO2 93 %.  Examination: General Appearance: The patient is well-developed, well-nourished, and in no distress. Skin: Gross inspection of skin unremarkable. Head: normocephalic, no gross deformities. Eyes: no gross deformities noted. ENT: ears appear grossly normal no exudates. Neck: Supple. No thyromegaly. No LAD. Respiratory: clear bilaterally. Cardiovascular: Normal S1 and S2 without murmur or rub. Extremities: No cyanosis. pulses are equal. Neurologic: Alert and oriented. No involuntary movements.  LABS: No results found for this or any previous visit (from the past 2160 hour(s)).  Radiology: MM 3D SCREEN BREAST BILATERAL  Result Date: 01/31/2019 CLINICAL DATA:  Screening. EXAM: DIGITAL SCREENING BILATERAL MAMMOGRAM WITH TOMO AND CAD COMPARISON:  Previous exam(s). ACR Breast Density Category c: The breast tissue is heterogeneously dense, which Cosey obscure small masses. FINDINGS: There are no findings suspicious for malignancy. Images were processed with CAD. IMPRESSION: No mammographic evidence of malignancy. A result letter of this screening mammogram will be mailed directly to the patient. RECOMMENDATION: Screening mammogram in one year. (Code:SM-B-01Y) BI-RADS CATEGORY  1: Negative. Electronically Signed   By: Fidela Salisbury M.D.   On: 01/31/2019 13:01    No results found.  No results found.    Assessment and Plan: Patient Active Problem List   Diagnosis Date Noted  . Stage 3 chronic kidney disease 06/30/2019  . Urinary tract infection with hematuria 12/20/2018  . Low back pain 12/20/2018  . Bilateral carotid artery stenosis 06/19/2018  . Cellulitis of finger of right hand 03/13/2018  . Encounter for general adult  medical examination with abnormal findings 12/18/2017  . Vitamin D deficiency 12/18/2017  . Dysuria 12/18/2017  . Chronic obstructive pulmonary disease ([place]) 08/17/2017  . SOB (shortness of breath) 08/17/2017  . GERD (gastroesophageal reflux disease) 08/17/2017  . Hyperlipidemia 08/17/2017  . Hypertension 08/17/2017  . Hypothyroidism 08/17/2017  . Osteoarthritis 08/17/2017    1. Chronic obstructive pulmonary disease, unspecified COPD type ([place]) Breztri samples given. Discussed use at this time, stop anoro. Continue to monitor.   2. Hypertension, unspecified type Stable, continue present management.   3. Gastroesophageal reflux disease without esophagitis Stable, continue to use otv meds as prescribed.   4. SOB (shortness of breath) - Spirometry with Graph  General Counseling: I have discussed the findings of the evaluation and examination with [facility].  I have also discussed any further diagnostic evaluation thatmay be needed or ordered today. Makynzee verbalizes understanding of the findings of todays visit. We also reviewed her medications today and discussed drug interactions and side effects including but not limited excessive drowsiness and altered mental states. We also  discussed that there is always a risk not just to her but also people around her. she has been encouraged to call the office with any questions or concerns that should arise related to todays visit.  Orders Placed This Encounter  Procedures  . Spirometry with Graph    Order Specific Question:   Where should this test be performed?    Answer:   [facility]     Time spent: 30 This patient was seen by Orson Gear AGNP-C in Collaboration with Dr. Devona Konig as a part of collaborative care agreement.   I have personally obtained a history, examined the patient, evaluated laboratory and imaging results, formulated the assessment and plan and placed orders.    Allyne Gee, MD [facility] Pulmonary and  Critical Care Sleep medicine

## 2019-12-25 ENCOUNTER — Telehealth: Payer: Self-pay

## 2019-12-25 NOTE — Telephone Encounter (Signed)
Confirmed appointment on 12/29/2019 and screened for covid. klh

## 2019-12-26 ENCOUNTER — Telehealth: Payer: Self-pay

## 2019-12-26 NOTE — Telephone Encounter (Signed)
Confirmed and screened for 12-30-19 ov.

## 2019-12-29 ENCOUNTER — Ambulatory Visit: Payer: PPO | Admitting: Internal Medicine

## 2019-12-29 ENCOUNTER — Encounter: Payer: Self-pay | Admitting: Internal Medicine

## 2019-12-29 ENCOUNTER — Other Ambulatory Visit: Payer: Self-pay

## 2019-12-29 VITALS — BP 133/55 | HR 63 | Temp 97.5°F | Resp 16 | Ht <= 58 in | Wt 135.2 lb

## 2019-12-29 DIAGNOSIS — J449 Chronic obstructive pulmonary disease, unspecified: Secondary | ICD-10-CM | POA: Diagnosis not present

## 2019-12-29 DIAGNOSIS — R0602 Shortness of breath: Secondary | ICD-10-CM

## 2019-12-29 DIAGNOSIS — I10 Essential (primary) hypertension: Secondary | ICD-10-CM

## 2019-12-29 DIAGNOSIS — K219 Gastro-esophageal reflux disease without esophagitis: Secondary | ICD-10-CM | POA: Diagnosis not present

## 2019-12-29 MED ORDER — FLUTICASONE-SALMETEROL 250-50 MCG/DOSE IN AEPB: 1.0000 | INHALATION_SPRAY | Freq: Two times a day (BID) | RESPIRATORY_TRACT | 3 refills | Status: DC

## 2019-12-29 MED ORDER — PROAIR RESPICLICK 108 (90 BASE) MCG/ACT IN AEPB: 2.0000 | INHALATION_SPRAY | RESPIRATORY_TRACT | 5 refills | Status: DC | PRN

## 2019-12-29 NOTE — Progress Notes (Signed)
[facility] [place], [place] [address]  Pulmonary Sleep Medicine   Office Visit Note  Patient Name: Brenda Tucker DOB: 1933/12/19 MRN [id number]  Date of Service: 12/29/2019  Complaints/HPI: Pt is here for pulmonary follow up.  She has been on Breztri for the past few weeks with no relief.  She reports she would like to go back on adviar and proair.  She felt like it was the best she has tried.  She denies any overt coughing or sob.   ROS  General: (-) fever, (-) chills, (-) night sweats, (-) weakness Skin: (-) rashes, (-) itching,. Eyes: (-) visual changes, (-) redness, (-) itching. Nose and Sinuses: (-) nasal stuffiness or itchiness, (-) postnasal drip, (-) nosebleeds, (-) sinus trouble. Mouth and Throat: (-) sore throat, (-) hoarseness. Neck: (-) swollen glands, (-) enlarged thyroid, (-) neck pain. Respiratory: - cough, (-) bloody sputum, + shortness of breath, - wheezing. Cardiovascular: - ankle swelling, (-) chest pain. Lymphatic: (-) lymph node enlargement. Neurologic: (-) numbness, (-) tingling. Psychiatric: (-) anxiety, (-) depression   Current Medication: Outpatient Encounter Medications as of 12/29/2019  Medication Sig  . Albuterol Sulfate (PROAIR RESPICLICK) 676 (90 Base) MCG/ACT AEPB Inhale 2 puffs into the lungs every 4 (four) hours as needed.  Marland Kitchen amLODipine (NORVASC) 5 MG tablet Take 1 tablet (5 mg total) by mouth daily.  Marland Kitchen aspirin EC 81 MG tablet Take 81 mg by mouth daily.  . carvedilol (COREG) 12.5 MG tablet Take 1 tablet (12.5 mg total) by mouth 2 (two) times daily with a meal.  . Cholecalciferol (VITAMIN D PO) Take by mouth.  . levothyroxine (SYNTHROID) 50 MCG tablet Take 1 tablet (50 mcg total) by mouth daily before breakfast.  . losartan (COZAAR) 100 MG tablet Take 100 mg by mouth daily.  . vitamin B-12 (CYANOCOBALAMIN) 1000 MCG tablet Take 1,000 mcg by mouth daily.  . [DISCONTINUED] Albuterol Sulfate (PROAIR RESPICLICK) 195 (90 Base)  MCG/ACT AEPB Inhale 2 puffs into the lungs every 4 (four) hours as needed.  . [DISCONTINUED] umeclidinium-vilanterol (ANORO ELLIPTA) 62.5-25 MCG/INH AEPB Inhale 1 puff into the lungs daily.  . Fluticasone-Salmeterol (ADVAIR DISKUS) 250-50 MCG/DOSE AEPB Inhale 1 puff into the lungs 2 (two) times daily.   No facility-administered encounter medications on file as of 12/29/2019.    Surgical History: Past Surgical History:  Procedure Laterality Date  . ABDOMINAL HYSTERECTOMY    . APPENDECTOMY    . COLONOSCOPY WITH PROPOFOL N/A 07/09/2015   Procedure: COLONOSCOPY WITH PROPOFOL;  Surgeon: Manya Silvas, MD;  Location: [facility] ENDOSCOPY;  Service: Endoscopy;  Laterality: N/A;    Medical History: Past Medical History:  Diagnosis Date  . Arthritis   . Asthma   . Chronic kidney disease   . COPD (chronic obstructive pulmonary disease) ([place])   . Hypertension   . Hypothyroidism     Family History: Family History  Family history unknown: Yes    Social History: Social History   Socioeconomic History  . Marital status: Widowed    Spouse name: Not on file  . Number of children: Not on file  . Years of education: Not on file  . Highest education level: Not on file  Occupational History  . Not on file  Tobacco Use  . Smoking status: Never Smoker  . Smokeless tobacco: Never Used  Substance and Sexual Activity  . Alcohol use: No  . Drug use: No  . Sexual activity: Not on file  Other Topics Concern  .  Not on file  Social History Narrative  . Not on file   Social Determinants of Health   Financial Resource Strain:   . Difficulty of Paying Living Expenses:   Food Insecurity:   . Worried About Charity fundraiser in the Last Year:   . Arboriculturist in the Last Year:   Transportation Needs:   . Film/video editor (Medical):   Marland Kitchen Lack of Transportation (Non-Medical):   Physical Activity:   . Days of Exercise per Week:   . Minutes of Exercise per Session:   Stress:   .  Feeling of Stress :   Social Connections:   . Frequency of Communication with Friends and Family:   . Frequency of Social Gatherings with Friends and Family:   . Attends Religious Services:   . Active Member of Clubs or Organizations:   . Attends Archivist Meetings:   Marland Kitchen Marital Status:   Intimate Partner Violence:   . Fear of Current or Ex-Partner:   . Emotionally Abused:   Marland Kitchen Physically Abused:   . Sexually Abused:     Vital Signs: Blood pressure (!) 133/55, pulse 63, temperature (!) 97.5 F (36.4 C), resp. rate 16, height 4\' 8"  (1.422 m), weight 135 lb 3.2 oz (61.3 kg), SpO2 94 %.  Examination: General Appearance: The patient is well-developed, well-nourished, and in no distress. Skin: Gross inspection of skin unremarkable. Head: normocephalic, no gross deformities. Eyes: no gross deformities noted. ENT: ears appear grossly normal no exudates. Neck: Supple. No thyromegaly. No LAD. Respiratory: clear bilaterally. Cardiovascular: Normal S1 and S2 without murmur or rub. Extremities: No cyanosis. pulses are equal. Neurologic: Alert and oriented. No involuntary movements.  LABS: No results found for this or any previous visit (from the past 2160 hour(s)).  Radiology: MM 3D SCREEN BREAST BILATERAL  Result Date: 01/31/2019 CLINICAL DATA:  Screening. EXAM: DIGITAL SCREENING BILATERAL MAMMOGRAM WITH TOMO AND CAD COMPARISON:  Previous exam(s). ACR Breast Density Category c: The breast tissue is heterogeneously dense, which [place] obscure small masses. FINDINGS: There are no findings suspicious for malignancy. Images were processed with CAD. IMPRESSION: No mammographic evidence of malignancy. A result letter of this screening mammogram will be mailed directly to the patient. RECOMMENDATION: Screening mammogram in one year. (Code:SM-B-01Y) BI-RADS CATEGORY  1: Negative. Electronically Signed   By: Fidela Salisbury M.D.   On: 01/31/2019 13:01    No results found.  No results  found.    Assessment and Plan: Patient Active Problem List   Diagnosis Date Noted  . Stage 3 chronic kidney disease 06/30/2019  . Urinary tract infection with hematuria 12/20/2018  . Low back pain 12/20/2018  . Bilateral carotid artery stenosis 06/19/2018  . Cellulitis of finger of right hand 03/13/2018  . Encounter for general adult medical examination with abnormal findings 12/18/2017  . Vitamin D deficiency 12/18/2017  . Dysuria 12/18/2017  . Chronic obstructive pulmonary disease ([place]) 08/17/2017  . SOB (shortness of breath) 08/17/2017  . GERD (gastroesophageal reflux disease) 08/17/2017  . Hyperlipidemia 08/17/2017  . Hypertension 08/17/2017  . Hypothyroidism 08/17/2017  . Osteoarthritis 08/17/2017    1. Chronic obstructive pulmonary disease, unspecified COPD type ([place]) Stop Breztri.  Continue with Advair and proair as prescribed.  -PFT - Fluticasone-Salmeterol (ADVAIR DISKUS) 250-50 MCG/DOSE AEPB; Inhale 1 puff into the lungs 2 (two) times daily.  Dispense: 1 each; Refill: 3  2. Hypertension, unspecified type Continue to follow.  3. Gastroesophageal reflux disease without esophagitis Stable, continue with  current treatment.   4. SOB (shortness of breath) Rescue inhaler refilled.  - Albuterol Sulfate (PROAIR RESPICLICK) 993 (90 Base) MCG/ACT AEPB; Inhale 2 puffs into the lungs every 4 (four) hours as needed.  Dispense: 1 each; Refill: 5  General Counseling: I have discussed the findings of the evaluation and examination with [facility].  I have also discussed any further diagnostic evaluation thatmay be needed or ordered today. Kasha verbalizes understanding of the findings of todays visit. We also reviewed her medications today and discussed drug interactions and side effects including but not limited excessive drowsiness and altered mental states. We also discussed that there is always a risk not just to her but also people around her. she has been encouraged to call the office  with any questions or concerns that should arise related to todays visit.  Orders Placed This Encounter  Procedures  . Pulmonary Function Test    Standing Status:   Future    Standing Expiration Date:   12/28/2020    Order Specific Question:   Where should this test be performed?    Answer:   [place]     Time spent: 30 This patient was seen by Orson Gear AGNP-C in Collaboration with Dr. Devona Konig as a part of collaborative care agreement.   I have personally obtained a history, examined the patient, evaluated laboratory and imaging results, formulated the assessment and plan and placed orders.    Allyne Gee, MD [facility] Pulmonary and Critical Care Sleep medicine

## 2019-12-30 ENCOUNTER — Ambulatory Visit (INDEPENDENT_AMBULATORY_CARE_PROVIDER_SITE_OTHER): Payer: PPO | Admitting: Nurse Practitioner

## 2019-12-30 VITALS — BP 130/55 | HR 71 | Temp 97.3°F | Resp 16 | Ht <= 58 in | Wt 135.2 lb

## 2019-12-30 DIAGNOSIS — I6523 Occlusion and stenosis of bilateral carotid arteries: Secondary | ICD-10-CM

## 2019-12-30 DIAGNOSIS — N183 Chronic kidney disease, stage 3 unspecified: Secondary | ICD-10-CM

## 2019-12-30 DIAGNOSIS — R1084 Generalized abdominal pain: Secondary | ICD-10-CM

## 2019-12-30 DIAGNOSIS — Z1231 Encounter for screening mammogram for malignant neoplasm of breast: Secondary | ICD-10-CM

## 2019-12-30 DIAGNOSIS — J449 Chronic obstructive pulmonary disease, unspecified: Secondary | ICD-10-CM

## 2019-12-30 DIAGNOSIS — Z0001 Encounter for general adult medical examination with abnormal findings: Secondary | ICD-10-CM

## 2019-12-30 DIAGNOSIS — R300 Dysuria: Secondary | ICD-10-CM | POA: Diagnosis not present

## 2019-12-30 DIAGNOSIS — I10 Essential (primary) hypertension: Secondary | ICD-10-CM

## 2019-12-30 NOTE — Progress Notes (Signed)
[facility] [place], [place] [address]  Internal MEDICINE  Office Visit Note  Patient Name: Brenda Tucker  [id number]  [id number]  Date of Service: 12/30/2019  Chief Complaint  Patient presents with  . Medicare Wellness  . COPD  . Hypertension     The patient is here for health maintenance exam. She does have some right sided flank pain today. States that she Jim have "overdone it" while working in her garden yesterday. Hurts more when she bends from side to side or when she stands from seated position. She states that she is able to take tylenol for this which will improve the pain.  Also c/o generalized abdominal pain with increased gas. This is present nearly all the time. There are times when pain is worse than others, but it seems to be always there.  She does see nephrology for chronic kidney disease. Today, her blood pressure is stable.  She is due to have routine fasting labs. She will also be due to have screening mammogram after 02/01/2020.   Pt is here for routine health maintenance examination  Current Medication: Outpatient Encounter Medications as of 12/30/2019  Medication Sig  . Albuterol Sulfate (PROAIR RESPICLICK) 253 (90 Base) MCG/ACT AEPB Inhale 2 puffs into the lungs every 4 (four) hours as needed.  Marland Kitchen amLODipine (NORVASC) 5 MG tablet Take 1 tablet (5 mg total) by mouth daily.  Marland Kitchen aspirin EC 81 MG tablet Take 81 mg by mouth daily.  . carvedilol (COREG) 12.5 MG tablet Take 1 tablet (12.5 mg total) by mouth 2 (two) times daily with a meal.  . Cholecalciferol (VITAMIN D PO) Take by mouth.  . Fluticasone-Salmeterol (ADVAIR DISKUS) 250-50 MCG/DOSE AEPB Inhale 1 puff into the lungs 2 (two) times daily.  Marland Kitchen levothyroxine (SYNTHROID) 50 MCG tablet Take 1 tablet (50 mcg total) by mouth daily before breakfast.  . losartan (COZAAR) 100 MG tablet Take 100 mg by mouth daily.  . vitamin B-12 (CYANOCOBALAMIN) 1000 MCG tablet Take 1,000 mcg by mouth daily.   . [DISCONTINUED] Albuterol Sulfate (PROAIR RESPICLICK) 664 (90 Base) MCG/ACT AEPB Inhale 2 puffs into the lungs every 4 (four) hours as needed.  . [DISCONTINUED] carvedilol (COREG) 12.5 MG tablet Take 1 tablet (12.5 mg total) by mouth 2 (two) times daily with a meal.  . [DISCONTINUED] Fluticasone-Umeclidin-Vilant (TRELEGY ELLIPTA) 100-62.5-25 MCG/INH AEPB Inhale 1 puff into the lungs daily.  . [DISCONTINUED] levothyroxine (SYNTHROID, LEVOTHROID) 50 MCG tablet Take 1 tablet (50 mcg total) by mouth daily before breakfast.   No facility-administered encounter medications on file as of 12/30/2019.    Surgical History: Past Surgical History:  Procedure Laterality Date  . ABDOMINAL HYSTERECTOMY    . APPENDECTOMY    . COLONOSCOPY WITH PROPOFOL N/A 07/09/2015   Procedure: COLONOSCOPY WITH PROPOFOL;  Surgeon: Manya Silvas, MD;  Location: [facility] ENDOSCOPY;  Service: Endoscopy;  Laterality: N/A;    Medical History: Past Medical History:  Diagnosis Date  . Arthritis   . Asthma   . Chronic kidney disease   . COPD (chronic obstructive pulmonary disease) ([place])   . Hypertension   . Hypothyroidism     Family History: Family History  Family history unknown: Yes      Review of Systems  Constitutional: Positive for fatigue. Negative for activity change, chills and unexpected weight change.  HENT: Negative for congestion, postnasal drip, rhinorrhea, sneezing and sore throat.   Respiratory: Positive for shortness of breath. Negative for cough, chest tightness and wheezing.  Scheduled to have PFT   Cardiovascular: Negative for chest pain, palpitations and leg swelling.  Gastrointestinal: Positive for abdominal pain. Negative for constipation, diarrhea, nausea and vomiting.       Increased gas and bloating and symptoms of GERD.   Endocrine: Negative for cold intolerance, heat intolerance, polydipsia and polyuria.  Genitourinary: Positive for flank pain. Negative for dysuria, frequency,  hematuria and urgency.  Musculoskeletal: Positive for arthralgias, back pain and myalgias. Negative for joint swelling and neck pain.  Skin: Negative for rash.  Allergic/Immunologic: Positive for environmental allergies.  Neurological: Negative for dizziness, tremors, light-headedness, numbness and headaches.  Hematological: Negative for adenopathy. Does not bruise/bleed easily.  Psychiatric/Behavioral: Negative for behavioral problems (Depression), dysphoric mood, sleep disturbance and suicidal ideas. The patient is not nervous/anxious.      Today's Vitals   12/30/19 1119  BP: (!) 130/55  Pulse: 71  Resp: 16  Temp: (!) 97.3 F (36.3 C)  SpO2: 94%  Weight: 135 lb 3.2 oz (61.3 kg)  Height: 4\' 8"  (1.422 m)   Body mass index is 30.31 kg/m.  Physical Exam Vitals and nursing note reviewed.  Constitutional:      General: She is not in acute distress.    Appearance: Normal appearance. She is well-developed. She is not diaphoretic.  HENT:     Head: Normocephalic and atraumatic.     Mouth/Throat:     Pharynx: No oropharyngeal exudate.  Eyes:     Pupils: Pupils are equal, round, and reactive to light.  Neck:     Thyroid: No thyromegaly.     Vascular: No carotid bruit or JVD.     Trachea: No tracheal deviation.  Cardiovascular:     Rate and Rhythm: Normal rate and regular rhythm.     Heart sounds: Murmur heard.  No friction rub. No gallop.      Comments: Mild dependant edema in bilateral lower extremities.  Pulmonary:     Effort: Pulmonary effort is normal. No respiratory distress.     Breath sounds: Normal breath sounds. No wheezing or rales.  Chest:     Chest wall: No tenderness.     Breasts:        Right: Normal. No swelling, bleeding, inverted nipple, mass, nipple discharge, skin change or tenderness.        Left: Normal. No swelling, bleeding, inverted nipple, mass, nipple discharge, skin change or tenderness.  Abdominal:     General: Bowel sounds are increased.      Palpations: Abdomen is soft.     Tenderness: There is abdominal tenderness. There is guarding.  Musculoskeletal:        General: Normal range of motion.     Cervical back: Normal range of motion and neck supple.     Comments: Mild low back pain when going from siting to standing position. Also hurts more with bending from side to side. Pain is mostly in the right flank area.   Lymphadenopathy:     Cervical: No cervical adenopathy.     Upper Body:     Right upper body: No axillary adenopathy.     Left upper body: No axillary adenopathy.  Skin:    General: Skin is warm and dry.  Neurological:     General: No focal deficit present.     Mental Status: She is alert and oriented to person, place, and time.     Cranial Nerves: No cranial nerve deficit.  Psychiatric:        Mood and Affect:  Mood normal.        Behavior: Behavior normal.        Thought Content: Thought content normal.        Judgment: Judgment normal.    Depression screen [facility] 2/9 12/30/2019 06/30/2019 12/19/2018 09/09/2018 06/18/2018  Decreased Interest 0 0 0 0 0  Down, Depressed, Hopeless 0 0 0 0 0  PHQ - 2 Score 0 0 0 0 0    Functional Status Survey: Is the patient deaf or have difficulty hearing?: No Does the patient have difficulty seeing, even when wearing glasses/contacts?: No Does the patient have difficulty concentrating, remembering, or making decisions?: Yes Does the patient have difficulty walking or climbing stairs?: No Does the patient have difficulty dressing or bathing?: No Does the patient have difficulty doing errands alone such as visiting a doctor's office or shopping?: No  MMSE - [place] Exam 12/30/2019 12/30/2019 12/19/2018  Orientation to time 5 5 5   Orientation to Place 5 5 5   Registration 3 3 3   Attention/ Calculation 5 5 5   Recall 3 3 3   Language- name 2 objects 2 2 2   Language- repeat 1 1 1   Language- follow 3 step command 3 1 3   Language- read & follow direction 1 1 1   Write a  sentence 1 1 1   Copy design 1 1 1   Total score 30 28 30     Fall Risk  12/30/2019 06/30/2019 12/19/2018 09/09/2018 06/18/2018  Falls in the past year? 0 0 0 0 0  Number falls in past yr: 0 - - - -     Assessment/Plan: 1. Encounter for general adult medical examination with abnormal findings Annual health maintenance exam today. Order slip gieven to have routine labsdrawn.   2. Generalized abdominal pain Unclear cause. Will get abdominal ultrasound for further evaluation  - US Abdomen Complete; Future  3. Bilateral carotid artery stenosis Carotid doppler done 08/2019 showing moderate plaque with 60-69% stenosis on right side and mild plaque with <50% stenosis on the left side. Repeat carotid doppler for surveillance.  - US Carotid Bilateral; Future  4. Hypertension, unspecified type Stable. Continue bp medication as prescribed.   5. Chronic obstructive pulmonary disease, unspecified COPD type ([place]) conitnue inhalers and respiratory medications as prescribed. Scheduled to have PFT.  6. Stage 3 chronic kidney disease, unspecified whether stage 3a or 3b CKD Continue regular visits with nephrology as scheduled.   7. Encounter for screening mammogram for malignant neoplasm of breast - MM DIGITAL SCREENING BILATERAL; Future  8. Dysuria - Urinalysis, Routine w reflex microscopic  General Counseling: Charolett verbalizes understanding of the findings of todays visit and agrees with plan of treatment. I have discussed any further diagnostic evaluation that [place] be needed or ordered today. We also reviewed her medications today. she has been encouraged to call the office with any questions or concerns that should arise related to todays visit.    Counseling:  This patient was seen by Leretha Pol FNP Collaboration with Dr Lavera Guise as a part of collaborative care agreement  Orders Placed This Encounter  Procedures  . MM DIGITAL SCREENING BILATERAL  . US Carotid Bilateral  . US Abdomen  Complete  . Urinalysis, Routine w reflex microscopic      Total time spent:30 Minutes  Time spent includes review of chart, medications, test results, and follow up plan with the patient.     Lavera Guise, MD  Internal Medicine

## 2019-12-31 ENCOUNTER — Other Ambulatory Visit: Payer: Self-pay | Admitting: Nurse Practitioner

## 2019-12-31 DIAGNOSIS — I10 Essential (primary) hypertension: Secondary | ICD-10-CM | POA: Diagnosis not present

## 2019-12-31 DIAGNOSIS — E559 Vitamin D deficiency, unspecified: Secondary | ICD-10-CM | POA: Diagnosis not present

## 2019-12-31 DIAGNOSIS — E782 Mixed hyperlipidemia: Secondary | ICD-10-CM | POA: Diagnosis not present

## 2019-12-31 DIAGNOSIS — Z0001 Encounter for general adult medical examination with abnormal findings: Secondary | ICD-10-CM | POA: Diagnosis not present

## 2019-12-31 LAB — MICROSCOPIC EXAMINATION
Casts: NONE SEEN /lpf
WBC, UA: >30 /hpf — AB (ref 0–5)

## 2019-12-31 LAB — URINALYSIS, ROUTINE W REFLEX MICROSCOPIC
Bilirubin, UA: NEGATIVE
Glucose, UA: NEGATIVE
Nitrite, UA: NEGATIVE
RBC, UA: NEGATIVE
Specific Gravity, UA: 1.019 (ref 1.005–1.030)
Urobilinogen, Ur: 0.2 mg/dL (ref 0.2–1.0)
pH, UA: 5 (ref 5.0–7.5)

## 2020-01-01 LAB — COMPREHENSIVE METABOLIC PANEL
ALT: 8 IU/L (ref 0–32)
AST: 14 IU/L (ref 0–40)
Albumin/Globulin Ratio: 1.1 — ABNORMAL LOW (ref 1.2–2.2)
Albumin: 3.8 g/dL (ref 3.6–4.6)
Alkaline Phosphatase: 55 IU/L (ref 48–121)
BUN/Creatinine Ratio: 17 (ref 12–28)
BUN: 24 mg/dL (ref 8–27)
Bilirubin Total: 0.8 mg/dL (ref 0.0–1.2)
CO2: 21 mmol/L (ref 20–29)
Calcium: 10.4 mg/dL — ABNORMAL HIGH (ref 8.7–10.3)
Chloride: 104 mmol/L (ref 96–106)
Creatinine, Ser: 1.39 mg/dL — ABNORMAL HIGH (ref 0.57–1.00)
GFR calc Af Amer: 40 mL/min/{1.73_m2} — ABNORMAL LOW (ref >59)
GFR calc non Af Amer: 35 mL/min/{1.73_m2} — ABNORMAL LOW (ref >59)
Globulin, Total: 3.4 g/dL (ref 1.5–4.5)
Glucose: 133 mg/dL — ABNORMAL HIGH (ref 65–99)
Potassium: 4.6 mmol/L (ref 3.5–5.2)
Sodium: 138 mmol/L (ref 134–144)
Total Protein: 7.2 g/dL (ref 6.0–8.5)

## 2020-01-01 LAB — LIPID PANEL WITH LDL/HDL RATIO
Cholesterol, Total: 212 mg/dL — ABNORMAL HIGH (ref 100–199)
HDL: 60 mg/dL (ref >39)
LDL Chol Calc (NIH): 130 mg/dL — ABNORMAL HIGH (ref 0–99)
LDL/HDL Ratio: 2.2 ratio (ref 0.0–3.2)
Triglycerides: 125 mg/dL (ref 0–149)
VLDL Cholesterol Cal: 22 mg/dL (ref 5–40)

## 2020-01-01 LAB — CBC
Hematocrit: 36.7 % (ref 34.0–46.6)
Hemoglobin: 11.9 g/dL (ref 11.1–15.9)
MCH: 33.3 pg — ABNORMAL HIGH (ref 26.6–33.0)
MCHC: 32.4 g/dL (ref 31.5–35.7)
MCV: 103 fL — ABNORMAL HIGH (ref 79–97)
Platelets: 265 10*3/uL (ref 150–450)
RBC: 3.57 x10E6/uL — ABNORMAL LOW (ref 3.77–5.28)
RDW: 12.1 % (ref 11.7–15.4)
WBC: 6.8 10*3/uL (ref 3.4–10.8)

## 2020-01-01 LAB — TSH: TSH: 2.15 u[IU]/mL (ref 0.450–4.500)

## 2020-01-01 LAB — VITAMIN D 25 HYDROXY (VIT D DEFICIENCY, FRACTURES): Vit D, 25-Hydroxy: 33.2 ng/mL (ref 30.0–100.0)

## 2020-01-01 LAB — T4, FREE: Free T4: 1.38 ng/dL (ref 0.82–1.77)

## 2020-01-02 ENCOUNTER — Telehealth: Payer: Self-pay

## 2020-01-02 ENCOUNTER — Other Ambulatory Visit: Payer: Self-pay | Admitting: Nurse Practitioner

## 2020-01-02 DIAGNOSIS — N390 Urinary tract infection, site not specified: Secondary | ICD-10-CM

## 2020-01-02 MED ORDER — CEPHALEXIN 250 MG PO CAPS: 250.0000 mg | ORAL_CAPSULE | Freq: Three times a day (TID) | ORAL | 0 refills | Status: DC

## 2020-01-02 NOTE — Progress Notes (Signed)
Please let the patient know that Urine sample from physical showing urinary tract infection. Added cephalexin 250mg  three times daily for next 10 days. Sent to [facility]. Her other labs were stable. thanks

## 2020-01-02 NOTE — Progress Notes (Signed)
Urine sample from physical showing urinary tract infection. Added cephalexin 250mg  three times daily for next 10 days. Sent to [facility]. Her other labs were stable.

## 2020-01-02 NOTE — Progress Notes (Signed)
Labs are stable. Sees nephrology.

## 2020-01-02 NOTE — Telephone Encounter (Signed)
Pt.notified

## 2020-01-02 NOTE — Telephone Encounter (Signed)
-----   Message from Ronnell Freshwater, NP sent at 01/02/2020  8:49 AM EDT ----- Please let the patient know that Urine sample from physical showing urinary tract infection. Added cephalexin 250mg  three times daily for next 10 days. Sent to [facility]. Her other labs were stable. thanks

## 2020-01-06 ENCOUNTER — Telehealth: Payer: Self-pay

## 2020-01-06 ENCOUNTER — Other Ambulatory Visit: Payer: Self-pay

## 2020-01-06 DIAGNOSIS — I10 Essential (primary) hypertension: Secondary | ICD-10-CM

## 2020-01-06 MED ORDER — AMLODIPINE BESYLATE 5 MG PO TABS: 5.0000 mg | ORAL_TABLET | Freq: Every day | ORAL | 1 refills | Status: DC

## 2020-01-06 NOTE — Telephone Encounter (Signed)
Left message confirming PFT appt 

## 2020-01-07 ENCOUNTER — Other Ambulatory Visit: Payer: Self-pay

## 2020-01-07 ENCOUNTER — Ambulatory Visit: Payer: PPO | Admitting: Internal Medicine

## 2020-01-07 DIAGNOSIS — R0602 Shortness of breath: Secondary | ICD-10-CM

## 2020-01-07 LAB — PULMONARY FUNCTION TEST

## 2020-01-08 NOTE — Procedures (Signed)
[place], [address]  DATE OF SERVICE: January 07, 2020  Complete Pulmonary Function Testing Interpretation:  FINDINGS:  The forced vital capacity is mildly decreased.  FEV1 is 0.73 L which is 53% of predicted and is moderately severe.  FEV1 FVC ratio is moderately decreased.  Total lung capacity is increased residual volume is increased residual internal capacity ratio is increased FRC is increased.  Patient was not able to perform DLCO postbronchodilator there is no significant improvement clinical improvement Abdelrahman occur in the absence of spirometric improvement.  IMPRESSION:  This pulmonary function study is consistent with moderate to severe obstructive lung disease.  There does not appear to be response to bronchodilator.  There is also evidence of hyperinflation.  Patient was not able to complete the DLCO measurement.  Allyne Gee, MD [facility] Pulmonary Critical Care Medicine Sleep Medicine

## 2020-01-13 ENCOUNTER — Telehealth: Payer: Self-pay

## 2020-01-13 NOTE — Telephone Encounter (Signed)
CONFIRMED PATIENT APPT. -AR °

## 2020-01-15 ENCOUNTER — Other Ambulatory Visit: Payer: Self-pay

## 2020-01-15 ENCOUNTER — Ambulatory Visit: Payer: PPO | Admitting: Internal Medicine

## 2020-01-15 ENCOUNTER — Encounter: Payer: Self-pay | Admitting: Internal Medicine

## 2020-01-15 VITALS — BP 130/54 | HR 64 | Temp 97.8°F | Resp 16 | Ht <= 58 in | Wt 134.0 lb

## 2020-01-15 DIAGNOSIS — R0602 Shortness of breath: Secondary | ICD-10-CM

## 2020-01-15 DIAGNOSIS — J449 Chronic obstructive pulmonary disease, unspecified: Secondary | ICD-10-CM | POA: Diagnosis not present

## 2020-01-15 DIAGNOSIS — K219 Gastro-esophageal reflux disease without esophagitis: Secondary | ICD-10-CM

## 2020-01-15 DIAGNOSIS — N183 Chronic kidney disease, stage 3 unspecified: Secondary | ICD-10-CM

## 2020-01-15 NOTE — Patient Instructions (Signed)
Chronic Obstructive Pulmonary Disease Chronic obstructive pulmonary disease (COPD) is a long-term (chronic) lung problem. When you have COPD, it is hard for air to get in and out of your lungs. Usually the condition gets worse over time, and your lungs will never return to normal. There are things you can do to keep yourself as healthy as possible.  Your doctor [place] treat your condition with: ? Medicines. ? Oxygen. ? Lung surgery.  Your doctor M[place]hew also recommend: ? Rehabilitation. This includes steps to make your body work better. It [place] involve a team of specialists. ? Quitting smoking, if you smoke. ? Exercise and changes to your diet. ? Comfort measures (palliative care). Follow these instructions at home: Medicines  Take over-the-counter and prescription medicines only as told by your doctor.  Talk to your doctor before taking any cough or allergy medicines. You [place] need to avoid medicines that cause your lungs to be dry. Lifestyle  If you smoke, stop. Smoking makes the problem worse. If you need help quitting, ask your doctor.  Avoid being around things that make your breathing worse. This [place] include smoke, chemicals, and fumes.  Stay active, but remember to rest as well.  Learn and use tips on how to relax.  Make sure you get enough sleep. Most adults need at least 7 hours of sleep every night.  Eat healthy foods. Eat smaller meals more often. Rest before meals. Controlled breathing Learn and use tips on how to control your breathing as told by your doctor. Try:  Breathing in (inhaling) through your nose for 1 second. Then, pucker your lips and breath out (exhale) through your lips for 2 seconds.  Putting one hand on your belly (abdomen). Breathe in slowly through your nose for 1 second. Your hand on your belly should move out. Pucker your lips and breathe out slowly through your lips. Your hand on your belly should move in as you breathe out.  Controlled coughing Learn  and use controlled coughing to clear mucus from your lungs. Follow these steps: 1. Lean your head a little forward. 2. Breathe in deeply. 3. Try to hold your breath for 3 seconds. 4. Keep your mouth slightly open while coughing 2 times. 5. Spit any mucus out into a tissue. 6. Rest and do the steps again 1 or 2 times as needed. General instructions  Make sure you get all the shots (vaccines) that your doctor recommends. Ask your doctor about a flu shot and a pneumonia shot.  Use oxygen therapy and pulmonary rehabilitation if told by your doctor. If you need home oxygen therapy, ask your doctor if you should buy a tool to measure your oxygen level (oximeter).  Make a COPD action plan with your doctor. This helps you to know what to do if you feel worse than usual.  Manage any other conditions you have as told by your doctor.  Avoid going outside when it is very hot, cold, or humid.  Avoid people who have a sickness you can catch (contagious).  Keep all follow-up visits as told by your doctor. This is important. Contact a doctor if:  You cough up more mucus than usual.  There is a change in the color or thickness of the mucus.  It is harder to breathe than usual.  Your breathing is faster than usual.  You have trouble sleeping.  You need to use your medicines more often than usual.  You have trouble doing your normal activities such as getting dressed   or walking around the house. Get help right away if:  You have shortness of breath while resting.  You have shortness of breath that stops you from: ? Being able to talk. ? Doing normal activities.  Your chest hurts for longer than 5 minutes.  Your skin color is more blue than usual.  Your pulse oximeter shows that you have low oxygen for longer than 5 minutes.  You have a fever.  You feel too tired to breathe normally. Summary  Chronic obstructive pulmonary disease (COPD) is a long-term lung problem.  The way your  lungs work will never return to normal. Usually the condition gets worse over time. There are things you can do to keep yourself as healthy as possible.  Take over-the-counter and prescription medicines only as told by your doctor.  If you smoke, stop. Smoking makes the problem worse. This information is not intended to replace advice given to you by your health care provider. Make sure you discuss any questions you have with your health care provider. Document Revised: 06/01/2017 Document Reviewed: 07/24/2016 Elsevier Patient Education  2020 Elsevier Inc.  

## 2020-01-15 NOTE — Progress Notes (Signed)
[facility] [place], [place] [address]  Pulmonary Sleep Medicine   Office Visit Note  Patient Name: Brenda Tucker DOB: 07-Apr-1934 MRN [id number]  Date of Service: 01/15/2020  Complaints/HPI: COPD post PFT patient is here for follow-up she had pulmonary function studies done.  I reviewed the PFTs essentially there is no significant worsening of her PFTs.  She states that she does have some issues with shortness of breath with exertion.  Denies having any chest pain no palpitations.  States that more or less her breathing is at her baseline.  She also did have an occasional cough.  She is not had any hemoptysis.  ROS  General: (-) fever, (-) chills, (-) night sweats, (-) weakness Skin: (-) rashes, (-) itching,. Eyes: (-) visual changes, (-) redness, (-) itching. Nose and Sinuses: (-) nasal stuffiness or itchiness, (-) postnasal drip, (-) nosebleeds, (-) sinus trouble. Mouth and Throat: (-) sore throat, (-) hoarseness. Neck: (-) swollen glands, (-) enlarged thyroid, (-) neck pain. Respiratory: - cough, (-) bloody sputum, + shortness of breath, - wheezing. Cardiovascular: - ankle swelling, (-) chest pain. Lymphatic: (-) lymph node enlargement. Neurologic: (-) numbness, (-) tingling. Psychiatric: (-) anxiety, (-) depression   Current Medication: Outpatient Encounter Medications as of 01/15/2020  Medication Sig  . Albuterol Sulfate (PROAIR RESPICLICK) 341 (90 Base) MCG/ACT AEPB Inhale 2 puffs into the lungs every 4 (four) hours as needed.  Marland Kitchen amLODipine (NORVASC) 5 MG tablet Take 1 tablet (5 mg total) by mouth daily.  Marland Kitchen aspirin EC 81 MG tablet Take 81 mg by mouth daily.  . carvedilol (COREG) 12.5 MG tablet Take 1 tablet (12.5 mg total) by mouth 2 (two) times daily with a meal.  . cephALEXin (KEFLEX) 250 MG capsule Take 1 capsule (250 mg total) by mouth 3 (three) times daily.  . Cholecalciferol (VITAMIN D PO) Take by mouth.  . Fluticasone-Salmeterol (ADVAIR  DISKUS) 250-50 MCG/DOSE AEPB Inhale 1 puff into the lungs 2 (two) times daily.  Marland Kitchen levothyroxine (SYNTHROID) 50 MCG tablet Take 1 tablet (50 mcg total) by mouth daily before breakfast.  . losartan (COZAAR) 100 MG tablet Take 100 mg by mouth daily.  . vitamin B-12 (CYANOCOBALAMIN) 1000 MCG tablet Take 1,000 mcg by mouth daily.   No facility-administered encounter medications on file as of 01/15/2020.    Surgical History: Past Surgical History:  Procedure Laterality Date  . ABDOMINAL HYSTERECTOMY    . APPENDECTOMY    . COLONOSCOPY WITH PROPOFOL N/A 07/09/2015   Procedure: COLONOSCOPY WITH PROPOFOL;  Surgeon: Manya Silvas, MD;  Location: [facility] ENDOSCOPY;  Service: Endoscopy;  Laterality: N/A;    Medical History: Past Medical History:  Diagnosis Date  . Arthritis   . Asthma   . Chronic kidney disease   . COPD (chronic obstructive pulmonary disease) ([place])   . Hypertension   . Hypothyroidism     Family History: Family History  Family history unknown: Yes    Social History: Social History   Socioeconomic History  . Marital status: Widowed    Spouse name: Not on file  . Number of children: Not on file  . Years of education: Not on file  . Highest education level: Not on file  Occupational History  . Not on file  Tobacco Use  . Smoking status: Never Smoker  . Smokeless tobacco: Never Used  Substance and Sexual Activity  . Alcohol use: No  . Drug use: No  . Sexual activity: Not on file  Other Topics  Concern  . Not on file  Social History Narrative  . Not on file   Social Determinants of Health   Financial Resource Strain:   . Difficulty of Paying Living Expenses:   Food Insecurity:   . Worried About Charity fundraiser in the Last Year:   . Arboriculturist in the Last Year:   Transportation Needs:   . Film/video editor (Medical):   Marland Kitchen Lack of Transportation (Non-Medical):   Physical Activity:   . Days of Exercise per Week:   . Minutes of Exercise per  Session:   Stress:   . Feeling of Stress :   Social Connections:   . Frequency of Communication with Friends and Family:   . Frequency of Social Gatherings with Friends and Family:   . Attends Religious Services:   . Active Member of Clubs or Organizations:   . Attends Archivist Meetings:   Marland Kitchen Marital Status:   Intimate Partner Violence:   . Fear of Current or Ex-Partner:   . Emotionally Abused:   Marland Kitchen Physically Abused:   . Sexually Abused:     Vital Signs: Blood pressure (!) 130/54, pulse 64, temperature 97.8 F (36.6 C), resp. rate 16, height 4' 8"  (1.422 m), weight 134 lb (60.8 kg), SpO2 92 %.  Examination: General Appearance: The patient is well-developed, well-nourished, and in no distress. Skin: Gross inspection of skin unremarkable. Head: normocephalic, no gross deformities. Eyes: no gross deformities noted. ENT: ears appear grossly normal no exudates. Neck: Supple. No thyromegaly. No LAD. Respiratory: few rhonchi noted. Cardiovascular: Normal S1 and S2 without murmur or rub. Extremities: No cyanosis. pulses are equal. Neurologic: Alert and oriented. No involuntary movements.  LABS: Recent Results (from the past 2160 hour(s))  Urinalysis, Routine w reflex microscopic     Status: Abnormal   Collection Time: 12/30/19 11:23 AM  Result Value Ref Range   Specific Gravity, UA 1.019 1.005 - 1.030   pH, UA 5.0 5.0 - 7.5   Color, UA Yellow Yellow   Appearance Ur Cloudy (A) Clear   Leukocytes,UA 2+ (A) Negative   Protein,UA Trace Negative/Trace   Glucose, UA Negative Negative   Ketones, UA Trace (A) Negative   RBC, UA Negative Negative   Bilirubin, UA Negative Negative   Urobilinogen, Ur 0.2 0.2 - 1.0 mg/dL   Nitrite, UA Negative Negative   Microscopic Examination See below:     Comment: Microscopic was indicated and was performed.  Microscopic Examination     Status: Abnormal   Collection Time: 12/30/19 11:23 AM   Urine  Result Value Ref Range   WBC, UA  >30 (A) 0 - 5 /hpf   RBC 0-2 0 - 2 /hpf   Epithelial Cells (non renal) 0-10 0 - 10 /hpf   Casts None seen None seen /lpf   Bacteria, UA Many (A) None seen/Few  Comprehensive metabolic panel     Status: Abnormal   Collection Time: 12/31/19  8:53 AM  Result Value Ref Range   Glucose 133 (H) 65 - 99 mg/dL   BUN 24 8 - 27 mg/dL   Creatinine, Ser 1.39 (H) 0.57 - 1.00 mg/dL   GFR calc non Af Amer 35 (L) >59 mL/min/1.73   GFR calc Af Amer 40 (L) >59 mL/min/1.73    Comment: **[facility] currently reports eGFR in compliance with the current**   recommendations of the [employer]. [facility] will   update reporting as new guidelines are published from the NKF-ASN  Task force.    BUN/Creatinine Ratio 17 12 - 28   Sodium 138 134 - 144 mmol/L   Potassium 4.6 3.5 - 5.2 mmol/L   Chloride 104 96 - 106 mmol/L   CO2 21 20 - 29 mmol/L   Calcium 10.4 (H) 8.7 - 10.3 mg/dL   Total Protein 7.2 6.0 - 8.5 g/dL   Albumin 3.8 3.6 - 4.6 g/dL   Globulin, Total 3.4 1.5 - 4.5 g/dL   Albumin/Globulin Ratio 1.1 (L) 1.2 - 2.2   Bilirubin Total 0.8 0.0 - 1.2 mg/dL   Alkaline Phosphatase 55 48 - 121 IU/L   AST 14 0 - 40 IU/L   ALT 8 0 - 32 IU/L  CBC     Status: Abnormal   Collection Time: 12/31/19  8:53 AM  Result Value Ref Range   WBC 6.8 3.4 - 10.8 x10E3/uL   RBC 3.57 (L) 3.77 - 5.28 x10E6/uL   Hemoglobin 11.9 11.1 - 15.9 g/dL   Hematocrit 36.7 34.0 - 46.6 %   MCV 103 (H) 79 - 97 fL   MCH 33.3 (H) 26.6 - 33.0 pg   MCHC 32.4 31 - 35 g/dL   RDW 12.1 11.7 - 15.4 %   Platelets 265 150 - 450 x10E3/uL  Lipid Panel With LDL/HDL Ratio     Status: Abnormal   Collection Time: 12/31/19  8:53 AM  Result Value Ref Range   Cholesterol, Total 212 (H) 100 - 199 mg/dL   Triglycerides 125 0 - 149 mg/dL   HDL 60 >39 mg/dL   VLDL Cholesterol Cal 22 5 - 40 mg/dL   LDL Chol Calc (NIH) 130 (H) 0 - 99 mg/dL   LDL/HDL Ratio 2.2 0.0 - 3.2 ratio    Comment:                                     LDL/HDL Ratio                                              Men  Women                               1/2 Avg.Risk  1.0    1.5                                   Avg.Risk  3.6    3.2                                2X Avg.Risk  6.2    5.0                                3X Avg.Risk  8.0    6.1   T4, free     Status: None   Collection Time: 12/31/19  8:53 AM  Result Value Ref Range   Free T4 1.38 0.82 - 1.77 ng/dL  TSH     Status: None   Collection Time: 12/31/19  8:53 AM  Result Value Ref Range   TSH  2.150 0.450 - 4.500 uIU/mL  VITAMIN D 25 Hydroxy (Vit-D Deficiency, Fractures)     Status: None   Collection Time: 12/31/19  8:53 AM  Result Value Ref Range   Vit D, 25-Hydroxy 33.2 30.0 - 100.0 ng/mL    Comment: Vitamin D deficiency has been defined by the [place] practice guideline as a level of serum 25-OH vitamin D less than 20 ng/mL (1,2). The Endocrine Society went on to further define vitamin D insufficiency as a level between 21 and 29 ng/mL (2). 1. IOM (Institute of Medicine). 2010. Dietary reference    intakes for calcium and D. [place]: The    [employer]. 2. Holick MF, Binkley [place], Bischoff-Ferrari HA, et al.    Evaluation, treatment, and prevention of vitamin D    deficiency: an Endocrine Society clinical practice    guideline. JCEM. 2011 Jul; 96(7):1911-30.     Radiology: MM 3D SCREEN BREAST BILATERAL  Result Date: 01/31/2019 CLINICAL DATA:  Screening. EXAM: DIGITAL SCREENING BILATERAL MAMMOGRAM WITH TOMO AND CAD COMPARISON:  Previous exam(s). ACR Breast Density Category c: The breast tissue is heterogeneously dense, which [place] obscure small masses. FINDINGS: There are no findings suspicious for malignancy. Images were processed with CAD. IMPRESSION: No mammographic evidence of malignancy. A result letter of this screening mammogram will be mailed directly to the patient. RECOMMENDATION: Screening mammogram in one year. (Code:SM-B-01Y)  BI-RADS CATEGORY  1: Negative. Electronically Signed   By: Fidela Salisbury M.D.   On: 01/31/2019 13:01    No results found.  No results found.    Assessment and Plan: Patient Active Problem List   Diagnosis Date Noted  . Generalized abdominal pain 12/30/2019  . Stage 3 chronic kidney disease 06/30/2019  . Urinary tract infection with hematuria 12/20/2018  . Low back pain 12/20/2018  . Bilateral carotid artery stenosis 06/19/2018  . Cellulitis of finger of right hand 03/13/2018  . Encounter for screening mammogram for malignant neoplasm of breast 12/18/2017  . Vitamin D deficiency 12/18/2017  . Dysuria 12/18/2017  . Chronic obstructive pulmonary disease ([place]) 08/17/2017  . SOB (shortness of breath) 08/17/2017  . GERD (gastroesophageal reflux disease) 08/17/2017  . Hyperlipidemia 08/17/2017  . Hypertension 08/17/2017  . Hypothyroidism 08/17/2017  . Osteoarthritis 08/17/2017    1. COPD we will continue with present medical management.  Nebulizers and inhalers were discussed with her.  She will continue with her inhaler regimen.  No need for adjustment at this time 2. Stage 3 CRF monitoring labs.  She does have stage III kidney disease by diagnosis.  No recent labs are noted 3. GERD no active reflux is noted at this time 4. SOB patient is at baseline we will continue to follow along  General Counseling: I have discussed the findings of the evaluation and examination with [facility]  I have also discussed any further diagnostic evaluation thatmay be needed or ordered today. Nykira verbalizes understanding of the findings of todays visit. We also reviewed her medications today and discussed drug interactions and side effects including but not limited excessive drowsiness and altered mental states. We also discussed that there is always a risk not just to her but also people around her. she has been encouraged to call the office with any questions or concerns that should arise related to  todays visit.  No orders of the defined types were placed in this encounter.    Time spent: 44mn  I have personally obtained a history, examined  the patient, evaluated laboratory and imaging results, formulated the assessment and plan and placed orders.    Allyne Gee, MD [facility] Pulmonary and Critical Care Sleep medicine

## 2020-01-22 ENCOUNTER — Other Ambulatory Visit: Payer: Self-pay

## 2020-01-22 DIAGNOSIS — I10 Essential (primary) hypertension: Secondary | ICD-10-CM

## 2020-01-22 MED ORDER — CARVEDILOL 12.5 MG PO TABS: 12.5000 mg | ORAL_TABLET | Freq: Two times a day (BID) | ORAL | 1 refills | Status: DC

## 2020-01-23 ENCOUNTER — Other Ambulatory Visit: Payer: Self-pay

## 2020-01-23 ENCOUNTER — Ambulatory Visit: Payer: PPO

## 2020-01-23 DIAGNOSIS — I6523 Occlusion and stenosis of bilateral carotid arteries: Secondary | ICD-10-CM

## 2020-01-26 DIAGNOSIS — R319 Hematuria, unspecified: Secondary | ICD-10-CM | POA: Diagnosis not present

## 2020-01-26 DIAGNOSIS — R809 Proteinuria, unspecified: Secondary | ICD-10-CM | POA: Diagnosis not present

## 2020-01-26 DIAGNOSIS — N1832 Chronic kidney disease, stage 3b: Secondary | ICD-10-CM | POA: Diagnosis not present

## 2020-01-26 DIAGNOSIS — I129 Hypertensive chronic kidney disease with stage 1 through stage 4 chronic kidney disease, or unspecified chronic kidney disease: Secondary | ICD-10-CM | POA: Diagnosis not present

## 2020-02-10 NOTE — Procedures (Signed)
[place],  [id number]  DATE OF SERVICE: January 23, 2020  CAROTID DOPPLER INTERPRETATION:  Bilateral Carotid Ultrsasound and Color Doppler Examination was performed. The RIGHT CCA shows moderate to severe plaque in the vessel. The LEFT CCA shows moderate plaque in the vessel. There was some intimal thickening noted in the RIGHT carotid artery. There was some intimal thickening in the LEFT carotid artery.  The RIGHT CCA shows peak systolic velocity of 77 cm per second. The end diastolic velocity is 11 cm per second on the RIGHT side. The RIGHT ICA shows peak systolic velocity of 659 per second. RIGHT sided ICA end diastolic velocity is 20 cm per second. The RIGHT ECA shows a peak systolic velocity of 935 cm per second. The ICA/CCA ratio is calculated to be 1.74. This suggests 50 to 69% stenosis. The Vertebral Artery shows antegrade flow.  The LEFT CCA shows peak systolic velocity of 701 cm per second. The end diastolic velocity is 13 cm per second on the LEFT side. The LEFT ICA shows peak systolic velocity of 779 per second. LEFT sided ICA end diastolic velocity is 25 cm per second. The LEFT ECA shows a peak systolic velocity of 390 cm per second. The ICA/CCA ratio is calculated to be 1.24. This suggests 50 to 69% stenosis. The Vertebral Artery shows antegrade flow.   Impression:    The RIGHT CAROTID shows 50 to 69% stenosis. The LEFT CAROTID shows 50 to 69% stenosis.  There is moderate to severe plaque formation noted on the LEFT and moderate to severe plaque on the RIGHT  side. Consider a repeat Carotid doppler if clinical situation and symptoms warrant in 6-12 months. Patient should be encouraged to change lifestyles such as smoking cessation, regular exercise and dietary modification. Use of statins in the right clinical setting and ASA is encouraged.  Allyne Gee, MD [facility] Pulmonary Critical Care Medicine

## 2020-02-11 NOTE — Progress Notes (Signed)
Reviewed. Discuss with patient at next visit.

## 2020-02-13 ENCOUNTER — Other Ambulatory Visit: Payer: Self-pay

## 2020-02-13 ENCOUNTER — Ambulatory Visit: Payer: PPO

## 2020-02-13 DIAGNOSIS — R1084 Generalized abdominal pain: Secondary | ICD-10-CM

## 2020-02-20 ENCOUNTER — Telehealth: Payer: Self-pay

## 2020-02-20 NOTE — Telephone Encounter (Signed)
Lmom to confirm and screen for 02-24-20 ov. 

## 2020-02-24 ENCOUNTER — Encounter: Payer: Self-pay | Admitting: Nurse Practitioner

## 2020-02-24 ENCOUNTER — Ambulatory Visit (INDEPENDENT_AMBULATORY_CARE_PROVIDER_SITE_OTHER): Payer: PPO | Admitting: Internal Medicine

## 2020-02-24 ENCOUNTER — Other Ambulatory Visit: Payer: Self-pay

## 2020-02-24 VITALS — BP 128/62 | HR 65 | Temp 97.5°F | Resp 16 | Ht <= 58 in | Wt 135.0 lb

## 2020-02-24 DIAGNOSIS — K8000 Calculus of gallbladder with acute cholecystitis without obstruction: Secondary | ICD-10-CM

## 2020-02-24 DIAGNOSIS — I6523 Occlusion and stenosis of bilateral carotid arteries: Secondary | ICD-10-CM

## 2020-02-24 DIAGNOSIS — J449 Chronic obstructive pulmonary disease, unspecified: Secondary | ICD-10-CM

## 2020-02-24 DIAGNOSIS — I10 Essential (primary) hypertension: Secondary | ICD-10-CM

## 2020-02-24 DIAGNOSIS — E782 Mixed hyperlipidemia: Secondary | ICD-10-CM | POA: Diagnosis not present

## 2020-02-24 MED ORDER — ATORVASTATIN CALCIUM 10 MG PO TABS: 10.0000 mg | ORAL_TABLET | Freq: Every day | ORAL | 3 refills | Status: DC

## 2020-02-24 NOTE — Progress Notes (Signed)
[facility] [place], [place] [address]  Internal MEDICINE  Office Visit Note  Patient Name: Brenda Tucker  [id number]  [id number]  Date of Service: 03/02/2020  Chief Complaint  Patient presents with  . Follow-up    review [place] and labs  . Quality Metric Gaps    covid vacc    HPI Pt is here for follow up  1. Abdominal u/s was ordered due to pain and CKD, does show gall stones, pt is in pain as well with episodes of nausea. 2. Carotid dopplers did show atherosclerosis as well, pt is ex smoker. 3. Labs did show elevated cr, and abnormal lipid profile. Glucose is slightly elevated as well  4. COPD and BP is under good control  Current Medication: Outpatient Encounter Medications as of 02/24/2020  Medication Sig  . Albuterol Sulfate (PROAIR RESPICLICK) 268 (90 Base) MCG/ACT AEPB Inhale 2 puffs into the lungs every 4 (four) hours as needed.  Marland Kitchen amLODipine (NORVASC) 5 MG tablet Take 1 tablet (5 mg total) by mouth daily.  Marland Kitchen aspirin EC 81 MG tablet Take 81 mg by mouth daily.  . carvedilol (COREG) 12.5 MG tablet Take 1 tablet (12.5 mg total) by mouth 2 (two) times daily with a meal.  . cephALEXin (KEFLEX) 250 MG capsule Take 1 capsule (250 mg total) by mouth 3 (three) times daily. (Patient not taking: Reported on 03/02/2020)  . levothyroxine (SYNTHROID) 50 MCG tablet Take 1 tablet (50 mcg total) by mouth daily before breakfast.  . losartan (COZAAR) 100 MG tablet Take 100 mg by mouth daily.  . vitamin B-12 (CYANOCOBALAMIN) 1000 MCG tablet Take 1,000 mcg by mouth daily.  . [DISCONTINUED] Cholecalciferol (VITAMIN D PO) Take by mouth.  . [DISCONTINUED] Fluticasone-Salmeterol (ADVAIR DISKUS) 250-50 MCG/DOSE AEPB Inhale 1 puff into the lungs 2 (two) times daily.  Marland Kitchen atorvastatin (LIPITOR) 10 MG tablet Take 1 tablet (10 mg total) by mouth daily.   No facility-administered encounter medications on file as of 02/24/2020.    Surgical History: Past Surgical History:  Procedure  Laterality Date  . ABDOMINAL HYSTERECTOMY    . APPENDECTOMY    . COLONOSCOPY WITH PROPOFOL N/A 07/09/2015   Procedure: COLONOSCOPY WITH PROPOFOL;  Surgeon: Manya Silvas, MD;  Location: [facility] ENDOSCOPY;  Service: Endoscopy;  Laterality: N/A;    Medical History: Past Medical History:  Diagnosis Date  . Arthritis   . Asthma   . Chronic kidney disease   . COPD (chronic obstructive pulmonary disease) ([place])   . Hypertension   . Hypothyroidism     Family History: Family History  Family history unknown: Yes    Social History   Socioeconomic History  . Marital status: Widowed    Spouse name: Not on file  . Number of children: Not on file  . Years of education: Not on file  . Highest education level: Not on file  Occupational History  . Not on file  Tobacco Use  . Smoking status: Never Smoker  . Smokeless tobacco: Never Used  Substance and Sexual Activity  . Alcohol use: No  . Drug use: No  . Sexual activity: Not on file  Other Topics Concern  . Not on file  Social History Narrative  . Not on file   Social Determinants of Health   Financial Resource Strain:   . Difficulty of Paying Living Expenses: Not on file  Food Insecurity:   . Worried About Charity fundraiser in the Last Year: Not on file  .  Ran Out of Food in the Last Year: Not on file  Transportation Needs:   . Lack of Transportation (Medical): Not on file  . Lack of Transportation (Non-Medical): Not on file  Physical Activity:   . Days of Exercise per Week: Not on file  . Minutes of Exercise per Session: Not on file  Stress:   . Feeling of Stress : Not on file  Social Connections:   . Frequency of Communication with Friends and Family: Not on file  . Frequency of Social Gatherings with Friends and Family: Not on file  . Attends Religious Services: Not on file  . Active Member of Clubs or Organizations: Not on file  . Attends Archivist Meetings: Not on file  . Marital Status: Not on file   Intimate Partner Violence:   . Fear of Current or Ex-Partner: Not on file  . Emotionally Abused: Not on file  . Physically Abused: Not on file  . Sexually Abused: Not on file      Review of Systems  Constitutional: Negative for activity change and diaphoresis.  HENT: Negative for postnasal drip and sore throat.   Eyes: Negative.   Respiratory: Negative for chest tightness and shortness of breath.   Cardiovascular: Negative for palpitations and leg swelling.  Gastrointestinal: Positive for abdominal pain and nausea.    Vital Signs: BP 128/62   Pulse 65   Temp (!) 97.5 F (36.4 C)   Resp 16   Ht 4\' 8"  (1.422 m)   Wt 135 lb (61.2 kg)   SpO2 96%   BMI 30.27 kg/m    Physical Exam Constitutional:      General: She is not in acute distress.    Appearance: She is well-developed. She is not diaphoretic.  HENT:     Head: Normocephalic and atraumatic.     Mouth/Throat:     Pharynx: No oropharyngeal exudate.  Eyes:     Pupils: Pupils are equal, round, and reactive to light.  Neck:     Thyroid: No thyromegaly.     Vascular: No JVD.     Trachea: No tracheal deviation.  Cardiovascular:     Rate and Rhythm: Normal rate and regular rhythm.     Heart sounds: Normal heart sounds. No murmur heard.  No friction rub. No gallop.   Pulmonary:     Effort: Pulmonary effort is normal. No respiratory distress.     Breath sounds: No wheezing or rales.  Chest:     Chest wall: No tenderness.  Abdominal:     General: Bowel sounds are normal.     Palpations: Abdomen is soft.     Comments: ruq tenderness with radiation to upper back   Musculoskeletal:        General: Normal range of motion.     Cervical back: Normal range of motion and neck supple.  Lymphadenopathy:     Cervical: No cervical adenopathy.  Skin:    General: Skin is warm and dry.  Neurological:     Mental Status: She is alert and oriented to person, place, and time.     Cranial Nerves: No cranial nerve deficit.   Psychiatric:        Behavior: Behavior normal.        Thought Content: Thought content normal.        Judgment: Judgment normal.    Assessment/Plan: 1. Acute calculous cholecystitis - Evidence of cholelithiasis which is symptomatic, will refer to surgery  - Ambulatory referral to General  Surgery  2. Bilateral carotid artery stenosis - Will need monitoring  - US Carotid Bilateral; Future  3. Hypertension, unspecified type - Controlled   4. Chronic obstructive pulmonary disease, unspecified COPD type ([place]) - Continue to see pulmonary   5. Mixed hyperlipidemia - Start Lipitor and will monitor glucose and cr   General Counseling: Brenda Tucker verbalizes understanding of the findings of todays visit and agrees with plan of treatment. I have discussed any further diagnostic evaluation that Reap be needed or ordered today. We also reviewed her medications today. she has been encouraged to call the office with any questions or concerns that should arise related to todays visit.   Orders Placed This Encounter  Procedures  . US Carotid Bilateral  . Ambulatory referral to General Surgery    Meds ordered this encounter  Medications  . atorvastatin (LIPITOR) 10 MG tablet    Sig: Take 1 tablet (10 mg total) by mouth daily.    Dispense:  90 tablet    Refill:  3    Total time spent: 30Minutes Time spent includes review of chart, medications, test results, and follow up plan with the patient.      Dr Lavera Guise Internal medicine

## 2020-03-02 ENCOUNTER — Encounter: Payer: Self-pay | Admitting: General Surgery

## 2020-03-02 ENCOUNTER — Other Ambulatory Visit: Payer: Self-pay

## 2020-03-02 ENCOUNTER — Telehealth: Payer: Self-pay | Admitting: General Surgery

## 2020-03-02 ENCOUNTER — Ambulatory Visit: Payer: PPO | Admitting: General Surgery

## 2020-03-02 ENCOUNTER — Telehealth: Payer: Self-pay

## 2020-03-02 VITALS — BP 156/76 | HR 67 | Temp 97.9°F | Ht <= 58 in | Wt 134.8 lb

## 2020-03-02 DIAGNOSIS — K8020 Calculus of gallbladder without cholecystitis without obstruction: Secondary | ICD-10-CM | POA: Diagnosis not present

## 2020-03-02 NOTE — Progress Notes (Signed)
Patient ID: Brenda Tucker, female   DOB: 1934-06-14, 86 y.o.   MRN: [id number]  Chief Complaint  Patient presents with  . New Patient (Initial Visit)    new pt ref Dr.Fozia Humphrey Rolls Cholecystitis    HPI Brenda Tucker is a 86 y.o. female.  She has been referred for further evaluation of gallstone disease.  She reports that she has been having pain on both sides of her abdomen for the past few weeks.  She says there is more pain on the right side and has had a couple of episodes of nausea and vomiting.  She also endorses worsening of acid reflux.  Sometimes, the pain radiates to her back.  She cannot correlate any of these episodes with any specific food intake, only that the pain seems to be more frequent.  She reports both diarrhea and constipation.  She has never had pancreatitis or jaundice.  She had a right upper quadrant ultrasound performed that showed cholelithiasis and biliary sludge, without gallbladder wall thickening, pericholecystic fluid, or biliary dilatation.  According to the sonographer, sonographic [name] sign was negative.  Note: The referral says "acute calculus cholecystitis," however I believe the query is actually for symptomatic cholelithiasis.   Past Medical History:  Diagnosis Date  . Arthritis   . Asthma   . Chronic kidney disease   . COPD (chronic obstructive pulmonary disease) ([place])   . Hypertension   . Hypothyroidism     Past Surgical History:  Procedure Laterality Date  . ABDOMINAL HYSTERECTOMY    . APPENDECTOMY    . COLONOSCOPY WITH PROPOFOL N/A 07/09/2015   Procedure: COLONOSCOPY WITH PROPOFOL;  Surgeon: Manya Silvas, MD;  Location: [facility] ENDOSCOPY;  Service: Endoscopy;  Laterality: N/A;    Family History  Family history unknown: Yes    Social History Social History   Tobacco Use  . Smoking status: Never Smoker  . Smokeless tobacco: Never Used  Substance Use Topics  . Alcohol use: No  . Drug use: No    Allergies  Allergen Reactions  . Theodrenaline      theodore  . Ventolin [Albuterol]     Current Outpatient Medications  Medication Sig Dispense Refill  . Albuterol Sulfate (PROAIR RESPICLICK) 182 (90 Base) MCG/ACT AEPB Inhale 2 puffs into the lungs every 4 (four) hours as needed. 1 each 5  . amLODipine (NORVASC) 5 MG tablet Take 1 tablet (5 mg total) by mouth daily. 90 tablet 1  . aspirin EC 81 MG tablet Take 81 mg by mouth daily.    Marland Kitchen atorvastatin (LIPITOR) 10 MG tablet Take 1 tablet (10 mg total) by mouth daily. 90 tablet 3  . Calcium 500-125 MG-UNIT TABS 1 (one) time each day    . carvedilol (COREG) 12.5 MG tablet Take 1 tablet (12.5 mg total) by mouth 2 (two) times daily with a meal. 180 tablet 1  . Fluticasone-Salmeterol (ADVAIR) 250-50 MCG/DOSE AEPB Inhale 1 puff into the lungs 2 (two) times daily.    Marland Kitchen levothyroxine (SYNTHROID) 50 MCG tablet Take 1 tablet (50 mcg total) by mouth daily before breakfast. 90 tablet 3  . losartan (COZAAR) 100 MG tablet Take 100 mg by mouth daily.    . vitamin B-12 (CYANOCOBALAMIN) 1000 MCG tablet Take 1,000 mcg by mouth daily.    . cephALEXin (KEFLEX) 250 MG capsule Take 1 capsule (250 mg total) by mouth 3 (three) times daily. (Patient not taking: Reported on 03/02/2020) 30 capsule 0  . Cholecalciferol (VITAMIN D3) 10 MCG (400  UNIT) tablet Take by mouth. (Patient not taking: Reported on 03/02/2020)    . MULTIPLE VITAMINS-MINERALS PO 1 (one) time each day (Patient not taking: Reported on 03/02/2020)     No current facility-administered medications for this visit.    Review of Systems Review of Systems  Respiratory: Positive for shortness of breath.   Gastrointestinal: Positive for abdominal pain, constipation, diarrhea, nausea and vomiting.  All other systems reviewed and are negative.   Blood pressure (!) 156/76, pulse 67, temperature 97.9 F (36.6 C), temperature source Oral, height 4\' 10"  (1.473 m), weight 134 lb 12.8 oz (61.1 kg), SpO2 97 %. Body mass index is 28.17 kg/m.  Physical  Exam Physical Exam Constitutional:      General: She is not in acute distress.    Appearance: Normal appearance. She is obese.  HENT:     Head: Normocephalic and atraumatic.     Nose:     Comments: Covered with a mask    Mouth/Throat:     Comments: Covered with a mask Eyes:     General: No scleral icterus.       Right eye: No discharge.        Left eye: No discharge.     Comments: Wearing glasses  Neck:     Comments: No palpable thyromegaly or dominant thyroid masses appreciated.  No palpable cervical or supraclavicular lymphadenopathy. Cardiovascular:     Rate and Rhythm: Normal rate and regular rhythm.     Heart sounds: Murmur heard.      Comments: There is a 2 out of 6 systolic ejection murmur. Pulmonary:     Effort: Pulmonary effort is normal. No respiratory distress.     Breath sounds: Wheezing present.     Comments: End expiratory wheezes appreciated in both upper lobes. Abdominal:     General: Bowel sounds are normal.     Palpations: Abdomen is soft.     Tenderness: There is abdominal tenderness.     Comments: She is tender to deep palpation in the midepigastrium and right upper quadrant.  Murphy sign is negative.  Genitourinary:    Comments: Deferred Musculoskeletal:        General: No tenderness or deformity.  Skin:    Coloration: Skin is not jaundiced.     Findings: No bruising.  Neurological:     General: No focal deficit present.     Mental Status: She is alert and oriented to person, place, and time.  Psychiatric:        Mood and Affect: Mood normal.        Behavior: Behavior normal.     Data Reviewed Results for EVANGELYNE, LOJA (MRN [id number]) as of 03/02/2020 11:17  Ref. Range 12/31/2019 08:53  Sodium Latest Ref Range: 134 - 144 mmol/L 138  Potassium Latest Ref Range: 3.5 - 5.2 mmol/L 4.6  Chloride Latest Ref Range: 96 - 106 mmol/L 104  CO2 Latest Ref Range: 20 - 29 mmol/L 21  Glucose Latest Ref Range: 65 - 99 mg/dL 133 (H)  BUN Latest Ref Range: 8 -  27 mg/dL 24  Creatinine Latest Ref Range: 0.57 - 1.00 mg/dL 1.39 (H)  Calcium Latest Ref Range: 8.7 - 10.3 mg/dL 10.4 (H)  BUN/Creatinine Ratio Latest Ref Range: 12 - 28  17  Alkaline Phosphatase Latest Ref Range: 48 - 121 IU/L 55  Albumin Latest Ref Range: 3.6 - 4.6 g/dL 3.8  Albumin/Globulin Ratio Latest Ref Range: 1.2 - 2.2  1.1 (L)  AST Latest Ref  Range: 0 - 40 IU/L 14  ALT Latest Ref Range: 0 - 32 IU/L 8  Total Protein Latest Ref Range: 6.0 - 8.5 g/dL 7.2  Total Bilirubin Latest Ref Range: 0.0 - 1.2 mg/dL 0.8  GFR, Est Non African American Latest Ref Range: >59 mL/min/1.73 35 (L)  GFR, Est African American Latest Ref Range: >59 mL/min/1.73 40 (L)  Cholesterol, Total Latest Ref Range: 100 - 199 mg/dL 212 (H)  HDL Cholesterol Latest Ref Range: >39 mg/dL 60  LDL/HDL Ratio Latest Ref Range: 0.0 - 3.2 ratio 2.2  Triglycerides Latest Ref Range: 0 - 149 mg/dL 125  VLDL Cholesterol Cal Latest Ref Range: 5 - 40 mg/dL 22  LDL Chol Calc (NIH) Latest Ref Range: 0 - 99 mg/dL 130 (H)  Vitamin D, 25-Hydroxy Latest Ref Range: 30.0 - 100.0 ng/mL 33.2  Globulin, Total Latest Ref Range: 1.5 - 4.5 g/dL 3.4  WBC Latest Ref Range: 3.4 - 10.8 x10E3/uL 6.8  RBC Latest Ref Range: 3.77 - 5.28 x10E6/uL 3.57 (L)  Hemoglobin Latest Ref Range: 11.1 - 15.9 g/dL 11.9  HCT Latest Ref Range: 34.0 - 46.6 % 36.7  MCV Latest Ref Range: 79 - 97 fL 103 (H)  MCH Latest Ref Range: 26.6 - 33.0 pg 33.3 (H)  MCHC Latest Ref Range: 31 - 35 g/dL 32.4  RDW Latest Ref Range: 11.7 - 15.4 % 12.1  Platelets Latest Ref Range: 150 - 450 x10E3/uL 265   The most recent labs that I have for this patient are from December 31, 2019.  Her transaminases are normal, as is her bilirubin.  White blood cell count is also within normal range.  She does have evidence of chronic renal insufficiency with a creatinine of 1.39 and a GFR of 35.  She had a carotid ultrasound performed on January 23, 2020, with the results copied here: Allyne Gee, MD    02/10/2020 3:49 PM  [place], [place] [address]   DATE OF SERVICE: January 23, 2020   CAROTID DOPPLER INTERPRETATION:   Bilateral Carotid Ultrsasound and Color Doppler Examination was  performed. The RIGHT CCA shows moderate to severe plaque in the  vessel. The LEFT CCA shows moderate plaque in the vessel. There  was some intimal thickening noted in the RIGHT carotid artery.  There was some intimal thickening in the LEFT carotid artery.   The RIGHT CCA shows peak systolic velocity of 77 cm per second.  The end diastolic velocity is 11 cm per second on the RIGHT side.  The RIGHT ICA shows peak systolic velocity of 182 per second.  RIGHT sided ICA end diastolic velocity is 20 cm per second. The  RIGHT ECA shows a peak systolic velocity of 993 cm per second.  The ICA/CCA ratio is calculated to be 1.74. This suggests 50 to  69% stenosis. The Vertebral Artery shows antegrade flow.   The LEFT CCA shows peak systolic velocity of 716 cm per second.  The end diastolic velocity is 13 cm per second on the LEFT side.  The LEFT ICA shows peak systolic velocity of 967 per second. LEFT  sided ICA end diastolic velocity is 25 cm per second. The LEFT  ECA shows a peak systolic velocity of 893 cm per second. The  ICA/CCA ratio is calculated to be 1.24. This suggests 50 to 69%  stenosis. The Vertebral Artery shows antegrade flow.    Impression:    The RIGHT CAROTID shows 50 to 69% stenosis.  The LEFT CAROTID  shows 50 to 69% stenosis. There is moderate to severe plaque  formation noted on the LEFT and moderate to severe plaque on the  RIGHT side. Consider a repeat Carotid doppler if clinical  situation and symptoms warrant in 6-12 months. Patient should be  encouraged to change lifestyles such as smoking cessation,  regular exercise and dietary modification. Use of statins in the  right clinical setting and ASA is encouraged.   Allyne Gee, MD [facility]   Pulmonary Critical Care Medicine   I was not able to personally reviewed the ultrasound, as it was performed outside of our system.  I do have the report and have copied it here:   Due to the murmur I appreciated on my physical examination, I looked back into her chart to see if I could find an echocardiogram.  There was one performed on February 12, 2018 and I have copied the result here:    Pulmonary function tests were performed on January 07, 2020.  The report is copied here:    Assessment This is an 86 year old woman who is having increasingly frequent episodes of right upper quadrant pain.  She does have gallstones appreciated on ultrasound and her symptoms are consistent with biliary colic.  She has a number of additional medical comorbidities, including COPD, renal insufficiency, and at least some degree of aortic stenosis.  I have offered her a cholecystectomy, with the caveat that she will require medical clearance, pulmonary clearance, and potentially cardiac clearance before we can perform her procedure.  I also let her know that due to the COVID-19 pandemic, we are experiencing some delays in elective cases that might potentially require an overnight stay, as she most likely would, due to her comorbid conditions.  Plan I discussed the procedure in detail.  We discussed the risks and benefits of a laparoscopic cholecystectomy and possible cholangiogram including, but not limited to: bleeding, infection, injury to surrounding structures such as the intestine or liver, bile leak, retained gallstones, need to convert to an open procedure, prolonged diarrhea, blood clots such as DVT, common bile duct injury, anesthesia risks, and possible need for additional procedures. The patient had the opportunity to ask any questions and these were answered to her satisfaction.  Request for clearance have been sent.  I will also request an anesthesia consultation prior to surgery.  We will work on getting  her scheduled.   Fredirick Maudlin 03/02/2020, 11:08 AM

## 2020-03-02 NOTE — Telephone Encounter (Signed)
Pulmonary Clearance was faxed over to Dr.Fozia Humphrey Rolls at [phone].  Cardiac Clearance was faxed over to Leretha Pol, NP at [phone].

## 2020-03-02 NOTE — H&P (View-Only) (Signed)
Patient ID: Brenda Tucker, female   DOB: 1933/08/19, 86 y.o.   MRN: [id number]  Chief Complaint  Patient presents with  . New Patient (Initial Visit)    new pt ref Brenda Tucker Cholecystitis    HPI Brenda Tucker is a 86 y.o. female.  She has been referred for further evaluation of gallstone disease.  She reports that she has been having pain on both sides of her abdomen for the past few weeks.  She says there is more pain on the right side and has had a couple of episodes of nausea and vomiting.  She also endorses worsening of acid reflux.  Sometimes, the pain radiates to her back.  She cannot correlate any of these episodes with any specific food intake, only that the pain seems to be more frequent.  She reports both diarrhea and constipation.  She has never had pancreatitis or jaundice.  She had a right upper quadrant ultrasound performed that showed cholelithiasis and biliary sludge, without gallbladder wall thickening, pericholecystic fluid, or biliary dilatation.  According to the sonographer, sonographic Brenda Tucker sign was negative.  Note: The referral says "acute calculus cholecystitis," however I believe the query is actually for symptomatic cholelithiasis.   Past Medical History:  Diagnosis Date  . Arthritis   . Asthma   . Chronic kidney disease   . COPD (chronic obstructive pulmonary disease) ([place])   . Hypertension   . Hypothyroidism     Past Surgical History:  Procedure Laterality Date  . ABDOMINAL HYSTERECTOMY    . APPENDECTOMY    . COLONOSCOPY WITH PROPOFOL N/A 07/09/2015   Procedure: COLONOSCOPY WITH PROPOFOL;  Surgeon: Brenda Silvas, MD;  Location: [facility] ENDOSCOPY;  Service: Endoscopy;  Laterality: N/A;    Family History  Family history unknown: Yes    Social History Social History   Tobacco Use  . Smoking status: Never Smoker  . Smokeless tobacco: Never Used  Substance Use Topics  . Alcohol use: No  . Drug use: No    Allergies  Allergen Reactions  . Brenda Tucker      theodore  . Brenda Tucker [Albuterol]     Current Outpatient Medications  Medication Sig Dispense Refill  . Albuterol Sulfate (PROAIR RESPICLICK) 409 (90 Base) MCG/ACT AEPB Inhale 2 puffs into the lungs every 4 (four) hours as needed. 1 each 5  . amLODipine (NORVASC) 5 MG tablet Take 1 tablet (5 mg total) by mouth daily. 90 tablet 1  . aspirin EC 81 MG tablet Take 81 mg by mouth daily.    Marland Kitchen atorvastatin (LIPITOR) 10 MG tablet Take 1 tablet (10 mg total) by mouth daily. 90 tablet 3  . Calcium 500-125 MG-UNIT TABS 1 (one) time each day    . carvedilol (COREG) 12.5 MG tablet Take 1 tablet (12.5 mg total) by mouth 2 (two) times daily with a meal. 180 tablet 1  . Fluticasone-Salmeterol (ADVAIR) 250-50 MCG/DOSE AEPB Inhale 1 puff into the lungs 2 (two) times daily.    Marland Kitchen levothyroxine (SYNTHROID) 50 MCG tablet Take 1 tablet (50 mcg total) by mouth daily before breakfast. 90 tablet 3  . losartan (COZAAR) 100 MG tablet Take 100 mg by mouth daily.    . vitamin B-12 (CYANOCOBALAMIN) 1000 MCG tablet Take 1,000 mcg by mouth daily.    . cephALEXin (KEFLEX) 250 MG capsule Take 1 capsule (250 mg total) by mouth 3 (three) times daily. (Patient not taking: Reported on 03/02/2020) 30 capsule 0  . Cholecalciferol (VITAMIN D3) 10 MCG (400  UNIT) tablet Take by mouth. (Patient not taking: Reported on 03/02/2020)    . MULTIPLE VITAMINS-MINERALS PO 1 (one) time each day (Patient not taking: Reported on 03/02/2020)     No current facility-administered medications for this visit.    Review of Systems Review of Systems  Respiratory: Positive for shortness of breath.   Gastrointestinal: Positive for abdominal pain, constipation, diarrhea, nausea and vomiting.  All other systems reviewed and are negative.   Blood pressure (!) 156/76, pulse 67, temperature 97.9 F (36.6 C), temperature source Oral, height 4\' 10"  (1.473 m), weight 134 lb 12.8 oz (61.1 kg), SpO2 97 %. Body mass index is 28.17 kg/m.  Physical  Exam Physical Exam Constitutional:      General: She is not in acute distress.    Appearance: Normal appearance. She is obese.  HENT:     Head: Normocephalic and atraumatic.     Nose:     Comments: Covered with a mask    Mouth/Throat:     Comments: Covered with a mask Eyes:     General: No scleral icterus.       Right eye: No discharge.        Left eye: No discharge.     Comments: Wearing glasses  Neck:     Comments: No palpable thyromegaly or dominant thyroid masses appreciated.  No palpable cervical or supraclavicular lymphadenopathy. Cardiovascular:     Rate and Rhythm: Normal rate and regular rhythm.     Heart sounds: Murmur heard.      Comments: There is a 2 out of 6 systolic ejection murmur. Pulmonary:     Effort: Pulmonary effort is normal. No respiratory distress.     Breath sounds: Wheezing present.     Comments: End expiratory wheezes appreciated in both upper lobes. Abdominal:     General: Bowel sounds are normal.     Palpations: Abdomen is soft.     Tenderness: There is abdominal tenderness.     Comments: She is tender to deep palpation in the midepigastrium and right upper quadrant.  Murphy sign is negative.  Genitourinary:    Comments: Deferred Musculoskeletal:        General: No tenderness or deformity.  Skin:    Coloration: Skin is not jaundiced.     Findings: No bruising.  Neurological:     General: No focal deficit present.     Mental Status: She is alert and oriented to person, place, and time.  Psychiatric:        Mood and Affect: Mood normal.        Behavior: Behavior normal.     Data Reviewed Results for Brenda, Tucker (MRN [id number]) as of 03/02/2020 11:17  Ref. Range 12/31/2019 08:53  Sodium Latest Ref Range: 134 - 144 mmol/L 138  Potassium Latest Ref Range: 3.5 - 5.2 mmol/L 4.6  Chloride Latest Ref Range: 96 - 106 mmol/L 104  CO2 Latest Ref Range: 20 - 29 mmol/L 21  Glucose Latest Ref Range: 65 - 99 mg/dL 133 (H)  BUN Latest Ref Range: 8 -  27 mg/dL 24  Creatinine Latest Ref Range: 0.57 - 1.00 mg/dL 1.39 (H)  Calcium Latest Ref Range: 8.7 - 10.3 mg/dL 10.4 (H)  BUN/Creatinine Ratio Latest Ref Range: 12 - 28  17  Alkaline Phosphatase Latest Ref Range: 48 - 121 IU/L 55  Albumin Latest Ref Range: 3.6 - 4.6 g/dL 3.8  Albumin/Globulin Ratio Latest Ref Range: 1.2 - 2.2  1.1 (L)  AST Latest Ref  Range: 0 - 40 IU/L 14  ALT Latest Ref Range: 0 - 32 IU/L 8  Total Protein Latest Ref Range: 6.0 - 8.5 g/dL 7.2  Total Bilirubin Latest Ref Range: 0.0 - 1.2 mg/dL 0.8  GFR, Est Non African American Latest Ref Range: >59 mL/min/1.73 35 (L)  GFR, Est African American Latest Ref Range: >59 mL/min/1.73 40 (L)  Cholesterol, Total Latest Ref Range: 100 - 199 mg/dL 212 (H)  HDL Cholesterol Latest Ref Range: >39 mg/dL 60  LDL/HDL Ratio Latest Ref Range: 0.0 - 3.2 ratio 2.2  Triglycerides Latest Ref Range: 0 - 149 mg/dL 125  VLDL Cholesterol Cal Latest Ref Range: 5 - 40 mg/dL 22  LDL Chol Calc (NIH) Latest Ref Range: 0 - 99 mg/dL 130 (H)  Vitamin D, 25-Hydroxy Latest Ref Range: 30.0 - 100.0 ng/mL 33.2  Globulin, Total Latest Ref Range: 1.5 - 4.5 g/dL 3.4  WBC Latest Ref Range: 3.4 - 10.8 x10E3/uL 6.8  RBC Latest Ref Range: 3.77 - 5.28 x10E6/uL 3.57 (L)  Hemoglobin Latest Ref Range: 11.1 - 15.9 g/dL 11.9  HCT Latest Ref Range: 34.0 - 46.6 % 36.7  MCV Latest Ref Range: 79 - 97 fL 103 (H)  MCH Latest Ref Range: 26.6 - 33.0 pg 33.3 (H)  MCHC Latest Ref Range: 31 - 35 g/dL 32.4  RDW Latest Ref Range: 11.7 - 15.4 % 12.1  Platelets Latest Ref Range: 150 - 450 x10E3/uL 265   The most recent labs that I have for this patient are from December 31, 2019.  Her transaminases are normal, as is her bilirubin.  White blood cell count is also within normal range.  She does have evidence of chronic renal insufficiency with a creatinine of 1.39 and a GFR of 35.  She had a carotid ultrasound performed on January 23, 2020, with the results copied here: Brenda Gee, MD    02/10/2020 3:49 PM  [place], [place] [address]   DATE OF SERVICE: January 23, 2020   CAROTID DOPPLER INTERPRETATION:   Bilateral Carotid Ultrsasound and Color Doppler Examination was  performed. The RIGHT CCA shows moderate to severe plaque in the  vessel. The LEFT CCA shows moderate plaque in the vessel. There  was some intimal thickening noted in the RIGHT carotid artery.  There was some intimal thickening in the LEFT carotid artery.   The RIGHT CCA shows peak systolic velocity of 77 cm per second.  The end diastolic velocity is 11 cm per second on the RIGHT side.  The RIGHT ICA shows peak systolic velocity of 607 per second.  RIGHT sided ICA end diastolic velocity is 20 cm per second. The  RIGHT ECA shows a peak systolic velocity of 371 cm per second.  The ICA/CCA ratio is calculated to be 1.74. This suggests 50 to  69% stenosis. The Vertebral Artery shows antegrade flow.   The LEFT CCA shows peak systolic velocity of 062 cm per second.  The end diastolic velocity is 13 cm per second on the LEFT side.  The LEFT ICA shows peak systolic velocity of 694 per second. LEFT  sided ICA end diastolic velocity is 25 cm per second. The LEFT  ECA shows a peak systolic velocity of 854 cm per second. The  ICA/CCA ratio is calculated to be 1.24. This suggests 50 to 69%  stenosis. The Vertebral Artery shows antegrade flow.    Impression:    The RIGHT CAROTID shows 50 to 69% stenosis.  The LEFT CAROTID  shows 50 to 69% stenosis. There is moderate to severe plaque  formation noted on the LEFT and moderate to severe plaque on the  RIGHT side. Consider a repeat Carotid doppler if clinical  situation and symptoms warrant in 6-12 months. Patient should be  encouraged to change lifestyles such as smoking cessation,  regular exercise and dietary modification. Use of statins in the  right clinical setting and ASA is encouraged.   Brenda Gee, MD [facility]   Pulmonary Critical Care Medicine   I was not able to personally reviewed the ultrasound, as it was performed outside of our system.  I do have the report and have copied it here:   Due to the murmur I appreciated on my physical examination, I looked back into her chart to see if I could find an echocardiogram.  There was one performed on February 12, 2018 and I have copied the result here:    Pulmonary function tests were performed on January 07, 2020.  The report is copied here:    Assessment This is an 86 year old woman who is having increasingly frequent episodes of right upper quadrant pain.  She does have gallstones appreciated on ultrasound and her symptoms are consistent with biliary colic.  She has a number of additional medical comorbidities, including COPD, renal insufficiency, and at least some degree of aortic stenosis.  I have offered her a cholecystectomy, with the caveat that she will require medical clearance, pulmonary clearance, and potentially cardiac clearance before we can perform her procedure.  I also let her know that due to the COVID-19 pandemic, we are experiencing some delays in elective cases that might potentially require an overnight stay, as she most likely would, due to her comorbid conditions.  Plan I discussed the procedure in detail.  We discussed the risks and benefits of a laparoscopic cholecystectomy and possible cholangiogram including, but not limited to: bleeding, infection, injury to surrounding structures such as the intestine or liver, bile leak, retained gallstones, need to convert to an open procedure, prolonged diarrhea, blood clots such as DVT, common bile duct injury, anesthesia risks, and possible need for additional procedures. The patient had the opportunity to ask any questions and these were answered to her satisfaction.  Request for clearance have been sent.  I will also request an anesthesia consultation prior to surgery.  We will work on getting  her scheduled.   Fredirick Maudlin 03/02/2020, 11:08 AM

## 2020-03-02 NOTE — Telephone Encounter (Signed)
Pt has been advised of Pre-Admission date/time, COVID Testing date and Surgery date.  Surgery Date: 03/12/20 Preadmission Testing Date: 03/09/20 (phone 8a-1p) Covid Testing Date: 03/10/20 - patient advised to go to the [facility] between 8a-1p  Patient has been made aware to call [phone], between 1-3:00pm the day before surgery, to find out what time to arrive for surgery.

## 2020-03-02 NOTE — Patient Instructions (Addendum)
Our surgery scheduler Pamala Hurry will contact you within the next 24-48 hours to discuss surgery. During that call, she will also discuss the preparation prior to surgery. Please have the BLUE sheet available when she contacts you. If you have any questions regarding surgery, please do not hesitate to give our office a call.  Pulmonary Clearance and Medical Clearance was sent at today's visit.  Cholecystitis  Cholecystitis is inflammation of the gallbladder. It is often called a gallbladder attack. The gallbladder is a pear-shaped organ that lies beneath the liver on the right side of the body. The gallbladder stores bile, which is a fluid that helps the body digest fats. If bile builds up in your gallbladder, your gallbladder becomes inflamed. This condition Bachand occur suddenly. Cholecystitis is a serious condition and requires treatment. What are the causes? The most common cause of this condition is gallstones. Gallstones can block the tube (duct) that carries bile out of your gallbladder. This causes bile to build up. Other causes include:  Damage to the gallbladder due to a decrease in blood flow.  Infections in the bile ducts.  Scars or kinks in the bile ducts.  Tumors in the liver, pancreas, or gallbladder. What increases the risk? You are more likely to develop this condition if:  You have sickle cell disease.  You take birth control pills or use estrogen.  You have alcoholic liver disease.  You have liver cirrhosis.  You have your nutrition delivered through a vein (parenteral nutrition).  You are critically ill.  You do not eat or drink for a long time. This is also called "fasting."  You are obese.  You lose weight too fast.  You are pregnant.  You have high levels of fat (triglycerides) in the blood.  You have pancreatitis. What are the signs or symptoms? Symptoms of this condition include:  Pain in the abdomen, especially in the upper right area of the  abdomen.  Tenderness or bloating in the abdomen.  Nausea.  Vomiting.  Fever.  Chills. How is this diagnosed? This condition is diagnosed with a medical history and physical exam. You Bridgers also have other tests, including:  Imaging tests, such as: ? An ultrasound of the gallbladder. ? A CT scan of the abdomen. ? A gallbladder nuclear scan (HIDA scan). This scan allows your health care provider to see the bile moving from your liver to your gallbladder and on to your small intestine. ? MRI.  Blood tests, such as: ? A complete blood count. The white blood cell count Matchett be higher than normal. ? Liver function tests. Certain types of gallstones cause some results to be higher than normal. How is this treated? Treatment Terrill include:  Surgery to remove your gallbladder (cholecystectomy).  Antibiotic medicine, usually through an IV.  Fasting for a certain amount of time.  Giving IV fluids.  Medicine to treat pain or vomiting. Follow these instructions at home:  If you had surgery, follow instructions from your health care provider about home care after the procedure. Medicines   Take over-the-counter and prescription medicines only as told by your health care provider.  If you were prescribed an antibiotic medicine, take it as told by your health care provider. Do not stop taking the antibiotic even if you start to feel better. General instructions  Follow instructions from your health care provider about what to eat or drink. When you are allowed to eat, avoid eating or drinking anything that triggers your symptoms.  Do not lift  anything that is heavier than 10 lb (4.5 kg), or the limit that you are told, until your health care provider says that it is safe.  Do not use any products that contain nicotine or tobacco, such as cigarettes and e-cigarettes. If you need help quitting, ask your health care provider.  Keep all follow-up visits as told by your health care  provider. This is important. Contact a health care provider if:  Your pain is not controlled with medicine.  You have a fever. Get help right away if:  Your pain moves to another part of your abdomen or to your back.  You continue to have symptoms or you develop new symptoms even with treatment. Summary  Cholecystitis is inflammation of the gallbladder.  The most common cause of this condition is gallstones. Gallstones can block the tube (duct) that carries bile out of your gallbladder.  Common symptoms are pain in the abdomen, nausea, vomiting, fever, and chills.  This condition is treated with surgery to remove the gallbladder, medicines, fasting, and IV fluids.  Follow your health care provider's instructions for eating and drinking. Avoid eating anything that triggers your symptoms. This information is not intended to replace advice given to you by your health care provider. Make sure you discuss any questions you have with your health care provider. Document Revised: 10/26/2017 Document Reviewed: 10/26/2017 Elsevier Patient Education  [place].  Laparoscopic Cholecystectomy Laparoscopic cholecystectomy is surgery to remove the gallbladder. The gallbladder is a pear-shaped organ that lies beneath the liver on the right side of the body. The gallbladder stores bile, which is a fluid that helps the body to digest fats. Cholecystectomy is often done for inflammation of the gallbladder (cholecystitis). This condition is usually caused by a buildup of gallstones (cholelithiasis) in the gallbladder. Gallstones can block the flow of bile, which can result in inflammation and pain. In severe cases, emergency surgery Mangas be required. This procedure is done though small incisions in your abdomen (laparoscopic surgery). A thin scope with a camera (laparoscope) is inserted through one incision. Thin surgical instruments are inserted through the other incisions. In some cases, a  laparoscopic procedure Tarver be turned into a type of surgery that is done through a larger incision (open surgery). Tell a health care provider about:  Any allergies you have.  All medicines you are taking, including vitamins, herbs, eye drops, creams, and over-the-counter medicines.  Any problems you or family members have had with anesthetic medicines.  Any blood disorders you have.  Any surgeries you have had.  Any medical conditions you have.  Whether you are pregnant or Christo be pregnant. What are the risks? Generally, this is a safe procedure. However, problems Lonzo occur, including:  Infection.  Bleeding.  Allergic reactions to medicines.  Damage to other structures or organs.  A stone remaining in the common bile duct. The common bile duct carries bile from the gallbladder into the small intestine.  A bile leak from the cyst duct that is clipped when your gallbladder is removed. What happens before the procedure? Staying hydrated Follow instructions from your health care provider about hydration, which Gebert include:  Up to 2 hours before the procedure - you Baldinger continue to drink clear liquids, such as water, clear fruit juice, black coffee, and plain tea. Eating and drinking restrictions Follow instructions from your health care provider about eating and drinking, which Chieffo include:  8 hours before the procedure - stop eating heavy meals or foods such as  meat, fried foods, or fatty foods.  6 hours before the procedure - stop eating light meals or foods, such as toast or cereal.  6 hours before the procedure - stop drinking milk or drinks that contain milk.  2 hours before the procedure - stop drinking clear liquids. Medicines  Ask your health care provider about: ? Changing or stopping your regular medicines. This is especially important if you are taking diabetes medicines or blood thinners. ? Taking medicines such as aspirin and ibuprofen. These medicines can  thin your blood. Do not take these medicines before your procedure if your health care provider instructs you not to.  You Janice be given antibiotic medicine to help prevent infection. General instructions  Let your health care provider know if you develop a cold or an infection before surgery.  Plan to have someone take you home from the hospital or clinic.  Ask your health care provider how your surgical site will be marked or identified. What happens during the procedure?   To reduce your risk of infection: ? Your health care team will wash or sanitize their hands. ? Your skin will be washed with soap. ? Hair Luthi be removed from the surgical area.  An IV tube Fresquez be inserted into one of your veins.  You will be given one or more of the following: ? A medicine to help you relax (sedative). ? A medicine to make you fall asleep (general anesthetic).  A breathing tube will be placed in your mouth.  Your surgeon will make several small cuts (incisions) in your abdomen.  The laparoscope will be inserted through one of the small incisions. The camera on the laparoscope will send images to a TV screen (monitor) in the operating room. This lets your surgeon see inside your abdomen.  Air-like gas will be pumped into your abdomen. This will expand your abdomen to give the surgeon more room to perform the surgery.  Other tools that are needed for the procedure will be inserted through the other incisions. The gallbladder will be removed through one of the incisions.  Your common bile duct Estorga be examined. If stones are found in the common bile duct, they Eblin be removed.  After your gallbladder has been removed, the incisions will be closed with stitches (sutures), staples, or skin glue.  Your incisions Cielo be covered with a bandage (dressing). The procedure Cerra vary among health care providers and hospitals. What happens after the procedure?  Your blood pressure, heart rate, breathing  rate, and blood oxygen level will be monitored until the medicines you were given have worn off.  You will be given medicines as needed to control your pain.  Do not drive for 24 hours if you were given a sedative. This information is not intended to replace advice given to you by your health care provider. Make sure you discuss any questions you have with your health care provider. Document Revised: 06/01/2017 Document Reviewed: 12/06/2015 Elsevier Patient Education  [place].

## 2020-03-05 NOTE — Progress Notes (Signed)
Pulmonary clearance from Dr Clayborn Bigness and Medical clearance from Leretha Pol, NP have both been received. The patient is cleared at Low risk for surgery.

## 2020-03-09 ENCOUNTER — Encounter
Admission: RE | Admit: 2020-03-09 | Discharge: 2020-03-09 | Disposition: A | Discharge status: Home / Self Care | Payer: PPO | Source: Ambulatory Visit | Attending: General Surgery | Admitting: General Surgery

## 2020-03-09 ENCOUNTER — Other Ambulatory Visit: Payer: Self-pay

## 2020-03-09 HISTORY — DX: Malignant (primary) neoplasm, unspecified: C80.1

## 2020-03-09 HISTORY — DX: Other complications of anesthesia, initial encounter: T88.59XA

## 2020-03-09 HISTORY — DX: Dyspnea, unspecified: R06.00

## 2020-03-09 NOTE — Patient Instructions (Signed)
Your procedure is scheduled on: 03/12/20 Report to [facility]. To find out your arrival time please call [phone] between 1PM - 3PM on 03/11/20.  Remember: Instructions that are not followed completely Rezendes result in serious medical risk, up to and including death, or upon the discretion of your surgeon and anesthesiologist your surgery [place] need to be rescheduled.     _X__ 1. Do not eat food after midnight the night before your procedure.                 No gum chewing or hard candies. You Clabo drink clear liquids up to 2 hours                 before you are scheduled to arrive for your surgery- DO not drink clear                 liquids within 2 hours of the start of your surgery.                 Clear Liquids include:  water, apple juice without pulp, clear carbohydrate                 drink such as Clearfast or Gatorade, Black Coffee or Tea (Do not add                 anything to coffee or tea). Diabetics water only  __X__2.  On the morning of surgery brush your teeth with toothpaste and water, you                 Beale rinse your mouth with mouthwash if you wish.  Do not swallow any              toothpaste of mouthwash.     _X__ 3.  No Alcohol for 24 hours before or after surgery.   _X__ 4.  Do Not Smoke or use e-cigarettes For 24 Hours Prior to Your Surgery.                 Do not use any chewable tobacco products for at least 6 hours prior to                 surgery.  ____  5.  Bring all medications with you on the day of surgery if instructed.   __X__  6.  Notify your doctor if there is any change in your medical condition      (cold, fever, infections).     Do not wear jewelry, make-up, hairpins, clips or nail polish. Do not wear lotions, powders, or perfumes.  Do not shave 48 hours prior to surgery. Men Knies shave face and neck. Do not bring valuables to the hospital.    [facility] is not responsible for any belongings or  valuables.  Contacts, dentures/partials or body piercings Kump not be worn into surgery. Bring a case for your contacts, glasses or hearing aids, a denture cup will be supplied. Leave your suitcase in the car. After surgery it Bivins be brought to your room. For patients admitted to the hospital, discharge time is determined by your treatment team.   Patients discharged the day of surgery will not be allowed to drive home.   Please read over the following fact sheets that you were given:   MRSA Information  __X__ Take these medicines the morning of surgery with A SIP OF WATER:  1. amLODipine (NORVASC) 5 MG tablet  2. carvedilol (COREG) 12.5 MG tablet  3. levothyroxine (SYNTHROID) 50 MCG tablet  4.  5.  6.  ____ Fleet Enema (as directed)   __X__ Use CHG Soap/SAGE wipes as directed  __X__ Use inhalers on the day of surgery  BRING ALBUTEROL WITH YOU  ____ Stop metformin/Janumet/Farxiga 2 days prior to surgery    ____ Take 1/2 of usual insulin dose the night before surgery. No insulin the morning          of surgery.   ____ Stop Blood Thinners Coumadin/Plavix/Xarelto/Pleta/Pradaxa/Eliquis/Effient/Aspirin  on   Or contact your Surgeon, Cardiologist or Medical Doctor regarding  ability to stop your blood thinners  __X__ Stop Anti-inflammatories 7 days before surgery such as Advil, Ibuprofen, Motrin,  BC or Goodies Powder, Naprosyn, Naproxen, Aleve, Aspirin    __X__ Stop all herbal supplements, fish oil or vitamin E until after surgery.    ____ Bring C-Pap to the hospital.       How to Use Chlorhexidine for Bathing Chlorhexidine gluconate (CHG) is a germ-killing (antiseptic) solution that is used to clean the skin. It can get rid of the bacteria that normally live on the skin and can keep them away for about 24 hours. To clean your skin with CHG, you Sze be given:  A CHG solution to use in the shower or as part of a sponge bath.  A prepackaged cloth that contains  CHG. Cleaning your skin with CHG Ament help lower the risk for infection:  While you are staying in the intensive care unit of the hospital.  If you have a vascular access, such as a central line, to provide short-term or long-term access to your veins.  If you have a catheter to drain urine from your bladder.  If you are on a ventilator. A ventilator is a machine that helps you breathe by moving air in and out of your lungs.  After surgery. What are the risks? Risks of using CHG include:  A skin reaction.  Hearing loss, if CHG gets in your ears.  Eye injury, if CHG gets in your eyes and is not rinsed out.  The CHG product catching fire. Make sure that you avoid smoking and flames after applying CHG to your skin. Do not use CHG:  If you have a chlorhexidine allergy or have previously reacted to chlorhexidine.  On babies younger than 4 months of age. How to use CHG solution  Use CHG only as told by your health care provider, and follow the instructions on the label.  Use the full amount of CHG as directed. Usually, this is one bottle. During a shower Follow these steps when using CHG solution during a shower (unless your health care provider gives you different instructions): 1. Start the shower. 2. Use your normal soap and shampoo to wash your face and hair. 3. Turn off the shower or move out of the shower stream. 4. Pour the CHG onto a clean washcloth. Do not use any type of brush or rough-edged sponge. 5. Starting at your neck, lather your body down to your toes. Make sure you follow these instructions: ? If you will be having surgery, pay special attention to the part of your body where you will be having surgery. Scrub this area for at least 1 minute. ? Do not use CHG on your head or face. If the solution gets into your ears or eyes, rinse them well with water. ? Avoid your genital  area. ? Avoid any areas of skin that have broken skin, cuts, or scrapes. ? Scrub your back  and under your arms. Make sure to wash skin folds. 6. Let the lather sit on your skin for 1-2 minutes or as long as told by your health care provider. 7. Thoroughly rinse your entire body in the shower. Make sure that all body creases and crevices are rinsed well. 8. Dry off with a clean towel. Do not put any substances on your body afterward--such as powder, lotion, or perfume--unless you are told to do so by your health care provider. Only use lotions that are recommended by the manufacturer. 9. Put on clean clothes or pajamas. 10. If it is the night before your surgery, sleep in clean sheets. 11. REPEAT THE MORNING OF SURGERY

## 2020-03-10 ENCOUNTER — Encounter
Admission: RE | Admit: 2020-03-10 | Discharge: 2020-03-10 | Disposition: A | Discharge status: Home / Self Care | Payer: PPO | Source: Ambulatory Visit | Attending: General Surgery | Admitting: General Surgery

## 2020-03-10 ENCOUNTER — Other Ambulatory Visit: Payer: PPO

## 2020-03-10 DIAGNOSIS — Z20822 Contact with and (suspected) exposure to covid-19: Secondary | ICD-10-CM | POA: Insufficient documentation

## 2020-03-10 DIAGNOSIS — I10 Essential (primary) hypertension: Secondary | ICD-10-CM | POA: Insufficient documentation

## 2020-03-10 LAB — SARS CORONAVIRUS 2 (TAT 6-24 HRS): SARS Coronavirus 2: NEGATIVE

## 2020-03-11 MED ORDER — CEFAZOLIN SODIUM-DEXTROSE 2-4 GM/100ML-% IV SOLN
2.0000 g | INTRAVENOUS | Status: AC
  Administered 2020-03-12: 2 g via INTRAVENOUS

## 2020-03-12 ENCOUNTER — Ambulatory Visit: Payer: PPO | Admitting: Certified Registered Nurse Anesthetist

## 2020-03-12 ENCOUNTER — Encounter: Payer: Self-pay | Admitting: General Surgery

## 2020-03-12 ENCOUNTER — Other Ambulatory Visit: Payer: Self-pay

## 2020-03-12 ENCOUNTER — Ambulatory Visit
Admission: RE | Admit: 2020-03-12 | Discharge: 2020-03-12 | Disposition: A | Discharge status: Home / Self Care | Payer: PPO | Attending: General Surgery | Admitting: General Surgery

## 2020-03-12 ENCOUNTER — Encounter
Admission: RE | Disposition: A | Discharge status: Home / Self Care | Payer: Self-pay | Source: Home / Self Care | Attending: General Surgery

## 2020-03-12 DIAGNOSIS — Z90710 Acquired absence of both cervix and uterus: Secondary | ICD-10-CM | POA: Diagnosis not present

## 2020-03-12 DIAGNOSIS — N189 Chronic kidney disease, unspecified: Secondary | ICD-10-CM | POA: Insufficient documentation

## 2020-03-12 DIAGNOSIS — K828 Other specified diseases of gallbladder: Secondary | ICD-10-CM | POA: Insufficient documentation

## 2020-03-12 DIAGNOSIS — M1990 Unspecified osteoarthritis, unspecified site: Secondary | ICD-10-CM | POA: Diagnosis not present

## 2020-03-12 DIAGNOSIS — E039 Hypothyroidism, unspecified: Secondary | ICD-10-CM | POA: Insufficient documentation

## 2020-03-12 DIAGNOSIS — Z888 Allergy status to other drugs, medicaments and biological substances status: Secondary | ICD-10-CM | POA: Diagnosis not present

## 2020-03-12 DIAGNOSIS — N183 Chronic kidney disease, stage 3 unspecified: Secondary | ICD-10-CM | POA: Diagnosis not present

## 2020-03-12 DIAGNOSIS — I129 Hypertensive chronic kidney disease with stage 1 through stage 4 chronic kidney disease, or unspecified chronic kidney disease: Secondary | ICD-10-CM | POA: Diagnosis not present

## 2020-03-12 DIAGNOSIS — K8010 Calculus of gallbladder with chronic cholecystitis without obstruction: Secondary | ICD-10-CM | POA: Insufficient documentation

## 2020-03-12 DIAGNOSIS — J449 Chronic obstructive pulmonary disease, unspecified: Secondary | ICD-10-CM | POA: Insufficient documentation

## 2020-03-12 DIAGNOSIS — R0602 Shortness of breath: Secondary | ICD-10-CM | POA: Diagnosis not present

## 2020-03-12 DIAGNOSIS — Z79899 Other long term (current) drug therapy: Secondary | ICD-10-CM | POA: Diagnosis not present

## 2020-03-12 DIAGNOSIS — I10 Essential (primary) hypertension: Secondary | ICD-10-CM | POA: Diagnosis not present

## 2020-03-12 DIAGNOSIS — K219 Gastro-esophageal reflux disease without esophagitis: Secondary | ICD-10-CM | POA: Insufficient documentation

## 2020-03-12 DIAGNOSIS — K8020 Calculus of gallbladder without cholecystitis without obstruction: Secondary | ICD-10-CM

## 2020-03-12 HISTORY — PX: CHOLECYSTECTOMY: SHX55

## 2020-03-12 SURGERY — LAPAROSCOPIC CHOLECYSTECTOMY
Anesthesia: General

## 2020-03-12 MED ORDER — CHLORHEXIDINE GLUCONATE 0.12 % MT SOLN: 15.0000 mL | Freq: Once | OROMUCOSAL | Status: AC

## 2020-03-12 MED ORDER — "VISTASEAL 4 ML SINGLE DOSE KIT "
PACK | CUTANEOUS | Status: DC | PRN
  Administered 2020-03-12: 4 mL via TOPICAL

## 2020-03-12 MED ORDER — FAMOTIDINE 20 MG PO TABS
ORAL_TABLET | ORAL | Status: AC
  Administered 2020-03-12: 20 mg via ORAL
  Filled 2020-03-12: qty 1

## 2020-03-12 MED ORDER — IPRATROPIUM-ALBUTEROL 0.5-2.5 (3) MG/3ML IN SOLN
RESPIRATORY_TRACT | Status: AC
  Administered 2020-03-12: 3 mL via RESPIRATORY_TRACT
  Filled 2020-03-12: qty 3

## 2020-03-12 MED ORDER — LACTATED RINGERS IV SOLN: INTRAVENOUS | Status: DC

## 2020-03-12 MED ORDER — LACTATED RINGERS IV SOLN: INTRAVENOUS | Status: DC | PRN

## 2020-03-12 MED ORDER — FENTANYL CITRATE (PF) 100 MCG/2ML IJ SOLN
INTRAMUSCULAR | Status: AC
  Administered 2020-03-12: 25 ug via INTRAVENOUS
  Filled 2020-03-12: qty 2

## 2020-03-12 MED ORDER — ROCURONIUM BROMIDE 10 MG/ML (PF) SYRINGE
PREFILLED_SYRINGE | INTRAVENOUS | Status: AC
  Filled 2020-03-12: qty 10

## 2020-03-12 MED ORDER — TRAMADOL HCL 50 MG PO TABS: 50.0000 mg | ORAL_TABLET | Freq: Four times a day (QID) | ORAL | 0 refills | Status: DC | PRN

## 2020-03-12 MED ORDER — DEXAMETHASONE SODIUM PHOSPHATE 10 MG/ML IJ SOLN
INTRAMUSCULAR | Status: DC | PRN
  Administered 2020-03-12: 5 mg via INTRAVENOUS

## 2020-03-12 MED ORDER — CHLORHEXIDINE GLUCONATE 0.12 % MT SOLN
OROMUCOSAL | Status: AC
  Administered 2020-03-12: 15 mL via OROMUCOSAL
  Filled 2020-03-12: qty 15

## 2020-03-12 MED ORDER — FAMOTIDINE 20 MG PO TABS: 20.0000 mg | ORAL_TABLET | Freq: Once | ORAL | Status: AC

## 2020-03-12 MED ORDER — DEXAMETHASONE SODIUM PHOSPHATE 10 MG/ML IJ SOLN
INTRAMUSCULAR | Status: AC
  Filled 2020-03-12: qty 1

## 2020-03-12 MED ORDER — CEFAZOLIN SODIUM-DEXTROSE 2-4 GM/100ML-% IV SOLN
INTRAVENOUS | Status: AC
  Filled 2020-03-12: qty 100

## 2020-03-12 MED ORDER — PROPOFOL 10 MG/ML IV BOLUS
INTRAVENOUS | Status: DC | PRN
  Administered 2020-03-12: 50 mg via INTRAVENOUS

## 2020-03-12 MED ORDER — ONDANSETRON HCL 4 MG/2ML IJ SOLN: 4.0000 mg | Freq: Once | INTRAMUSCULAR | Status: DC | PRN

## 2020-03-12 MED ORDER — BUPIVACAINE HCL (PF) 0.25 % IJ SOLN
INTRAMUSCULAR | Status: AC
  Filled 2020-03-12: qty 30

## 2020-03-12 MED ORDER — ORAL CARE MOUTH RINSE: 15.0000 mL | Freq: Once | OROMUCOSAL | Status: AC

## 2020-03-12 MED ORDER — ONDANSETRON HCL 4 MG/2ML IJ SOLN
INTRAMUSCULAR | Status: DC | PRN
  Administered 2020-03-12: 4 mg via INTRAVENOUS

## 2020-03-12 MED ORDER — ACETAMINOPHEN 500 MG PO TABS: 1000.0000 mg | ORAL_TABLET | ORAL | Status: AC

## 2020-03-12 MED ORDER — LIDOCAINE HCL (CARDIAC) PF 100 MG/5ML IV SOSY
PREFILLED_SYRINGE | INTRAVENOUS | Status: DC | PRN
  Administered 2020-03-12: 40 mg via INTRAVENOUS

## 2020-03-12 MED ORDER — LIDOCAINE HCL (PF) 2 % IJ SOLN
INTRAMUSCULAR | Status: AC
  Filled 2020-03-12: qty 5

## 2020-03-12 MED ORDER — ONDANSETRON HCL 4 MG/2ML IJ SOLN
INTRAMUSCULAR | Status: AC
  Filled 2020-03-12: qty 2

## 2020-03-12 MED ORDER — ACETAMINOPHEN 500 MG PO TABS
ORAL_TABLET | ORAL | Status: AC
  Administered 2020-03-12: 1000 mg via ORAL
  Filled 2020-03-12: qty 2

## 2020-03-12 MED ORDER — ONDANSETRON 4 MG PO TBDP: 4.0000 mg | ORAL_TABLET | Freq: Three times a day (TID) | ORAL | 0 refills | Status: DC | PRN

## 2020-03-12 MED ORDER — IBUPROFEN 600 MG PO TABS: 600.0000 mg | ORAL_TABLET | Freq: Three times a day (TID) | ORAL | 1 refills | Status: DC | PRN

## 2020-03-12 MED ORDER — GLYCOPYRROLATE 0.2 MG/ML IJ SOLN
INTRAMUSCULAR | Status: AC
  Filled 2020-03-12: qty 1

## 2020-03-12 MED ORDER — PHENYLEPHRINE HCL (PRESSORS) 10 MG/ML IV SOLN
INTRAVENOUS | Status: DC | PRN
  Administered 2020-03-12 (×2): 50 ug via INTRAVENOUS

## 2020-03-12 MED ORDER — CHLORHEXIDINE GLUCONATE CLOTH 2 % EX PADS
6.0000 | MEDICATED_PAD | Freq: Once | CUTANEOUS | Status: AC
  Administered 2020-03-12: 6 via TOPICAL

## 2020-03-12 MED ORDER — DEXMEDETOMIDINE HCL IN NACL 200 MCG/50ML IV SOLN
INTRAVENOUS | Status: DC | PRN
  Administered 2020-03-12 (×2): 2 ug via INTRAVENOUS

## 2020-03-12 MED ORDER — FENTANYL CITRATE (PF) 100 MCG/2ML IJ SOLN
INTRAMUSCULAR | Status: AC
  Filled 2020-03-12: qty 2

## 2020-03-12 MED ORDER — ROCURONIUM BROMIDE 100 MG/10ML IV SOLN
INTRAVENOUS | Status: DC | PRN
  Administered 2020-03-12: 40 mg via INTRAVENOUS

## 2020-03-12 MED ORDER — LIDOCAINE-EPINEPHRINE 1 %-1:100000 IJ SOLN
INTRAMUSCULAR | Status: AC
  Filled 2020-03-12: qty 1

## 2020-03-12 MED ORDER — LIDOCAINE-EPINEPHRINE 1 %-1:100000 IJ SOLN
INTRAMUSCULAR | Status: DC | PRN
  Administered 2020-03-12: 16 mL via INTRAMUSCULAR

## 2020-03-12 MED ORDER — FENTANYL CITRATE (PF) 100 MCG/2ML IJ SOLN
INTRAMUSCULAR | Status: DC | PRN
Start: 2020-03-12 — End: 2020-03-12
  Administered 2020-03-12: 50 ug via INTRAVENOUS
  Administered 2020-03-12 (×2): 25 ug via INTRAVENOUS

## 2020-03-12 MED ORDER — FENTANYL CITRATE (PF) 100 MCG/2ML IJ SOLN
25.0000 ug | INTRAMUSCULAR | Status: DC | PRN
  Administered 2020-03-12 (×3): 25 ug via INTRAVENOUS

## 2020-03-12 MED ORDER — IPRATROPIUM-ALBUTEROL 0.5-2.5 (3) MG/3ML IN SOLN: 3.0000 mL | Freq: Once | RESPIRATORY_TRACT | Status: AC

## 2020-03-12 MED ORDER — SUGAMMADEX SODIUM 200 MG/2ML IV SOLN
INTRAVENOUS | Status: DC | PRN
  Administered 2020-03-12 (×3): 50 mg via INTRAVENOUS

## 2020-03-12 MED ORDER — GLYCOPYRROLATE 0.2 MG/ML IJ SOLN
INTRAMUSCULAR | Status: DC | PRN
  Administered 2020-03-12 (×2): .1 mg via INTRAVENOUS

## 2020-03-12 SURGICAL SUPPLY — 49 items
APPLICATOR VISTASEAL 35 (MISCELLANEOUS) ×2 IMPLANT
APPLIER CLIP 5 13 M/L LIGAMAX5 (MISCELLANEOUS) ×2
BLADE SURG SZ11 CARB STEEL (BLADE) ×2 IMPLANT
CANISTER SUCT 1200ML W/VALVE (MISCELLANEOUS) ×2 IMPLANT
CATH CHOLANGI 4FR 420404F (CATHETERS) IMPLANT
CHLORAPREP W/TINT 26 (MISCELLANEOUS) ×2 IMPLANT
CLIP APPLIE 5 13 M/L LIGAMAX5 (MISCELLANEOUS) ×1 IMPLANT
COVER WAND RF STERILE (DRAPES) ×2 IMPLANT
DECANTER SPIKE VIAL GLASS SM (MISCELLANEOUS) ×4 IMPLANT
DEFOGGER SCOPE WARMER CLEARIFY (MISCELLANEOUS) ×2 IMPLANT
DERMABOND ADVANCED (GAUZE/BANDAGES/DRESSINGS) ×1
DERMABOND ADVANCED .7 DNX12 (GAUZE/BANDAGES/DRESSINGS) ×1 IMPLANT
ELECT CAUTERY BLADE TIP 2.5 (TIP) ×2
ELECT REM PT RETURN 9FT ADLT (ELECTROSURGICAL) ×2
ELECTRODE CAUTERY BLDE TIP 2.5 (TIP) ×1 IMPLANT
ELECTRODE REM PT RTRN 9FT ADLT (ELECTROSURGICAL) ×1 IMPLANT
GLOVE BIO SURGEON STRL SZ 6.5 (GLOVE) ×2 IMPLANT
GLOVE INDICATOR 7.0 STRL GRN (GLOVE) ×2 IMPLANT
GOWN STRL REUS W/ TWL LRG LVL3 (GOWN DISPOSABLE) ×3 IMPLANT
GOWN STRL REUS W/TWL LRG LVL3 (GOWN DISPOSABLE) ×3
GRASPER SUT TROCAR 14GX15 (MISCELLANEOUS) ×2 IMPLANT
IRRIGATION STRYKERFLOW (MISCELLANEOUS) ×1 IMPLANT
IRRIGATOR STRYKERFLOW (MISCELLANEOUS) ×2
IV CATH ANGIO 12GX3 LT BLUE (NEEDLE) IMPLANT
IV NS 1000ML (IV SOLUTION) ×1
IV NS 1000ML BAXH (IV SOLUTION) ×1 IMPLANT
KIT TURNOVER KIT A (KITS) ×2 IMPLANT
KITTNER LAPARASCOPIC 5X40 (MISCELLANEOUS) IMPLANT
LABEL OR SOLS (LABEL) ×2 IMPLANT
NEEDLE HYPO 22GX1.5 SAFETY (NEEDLE) ×2 IMPLANT
NS IRRIG 500ML POUR BTL (IV SOLUTION) ×2 IMPLANT
PACK LAP CHOLECYSTECTOMY (MISCELLANEOUS) ×2 IMPLANT
PENCIL ELECTRO HAND CTR (MISCELLANEOUS) ×2 IMPLANT
POUCH SPECIMEN RETRIEVAL 10MM (ENDOMECHANICALS) ×2 IMPLANT
SCISSORS METZENBAUM CVD 33 (INSTRUMENTS) ×2 IMPLANT
SET TUBE SMOKE EVAC HIGH FLOW (TUBING) ×2 IMPLANT
SLEEVE ADV FIXATION 5X100MM (TROCAR) ×4 IMPLANT
SOLUTION ELECTROLUBE (MISCELLANEOUS) ×2 IMPLANT
STRIP CLOSURE SKIN 1/2X4 (GAUZE/BANDAGES/DRESSINGS) ×2 IMPLANT
SUT MNCRL 4-0 (SUTURE) ×1
SUT MNCRL 4-0 27XMFL (SUTURE) ×1
SUT VIC AB 3-0 SH 27 (SUTURE) ×1
SUT VIC AB 3-0 SH 27X BRD (SUTURE) ×1 IMPLANT
SUT VICRYL 0 AB UR-6 (SUTURE) ×4 IMPLANT
SUTURE MNCRL 4-0 27XMF (SUTURE) ×1 IMPLANT
SYS KII FIOS ACCESS ABD 5X100 (TROCAR) ×2
SYSTEM KII FIOS ACES ABD 5X100 (TROCAR) ×1 IMPLANT
TROCAR ADV FIXATION 12X100MM (TROCAR) ×2 IMPLANT
WATER STERILE IRR 1000ML POUR (IV SOLUTION) ×2 IMPLANT

## 2020-03-12 NOTE — Progress Notes (Signed)
Patient appeared to have gottien choked on saliva and started coughing. Oxygen sats dropped for a brief period to 91% so we called Dr. Lubertha Basque and she visited the patient and she feels the patient is doing okay. We will monitor her.

## 2020-03-12 NOTE — Anesthesia Postprocedure Evaluation (Signed)
Anesthesia Post Note  Patient: Brenda Tucker  Procedure(s) Performed: LAPAROSCOPIC CHOLECYSTECTOMY (N/A )  Patient location during evaluation: PACU Anesthesia Type: General Level of consciousness: awake and alert Pain management: pain level controlled Vital Signs Assessment: post-procedure vital signs reviewed and stable Respiratory status: spontaneous breathing and respiratory function stable Cardiovascular status: blood pressure returned to baseline and stable Anesthetic complications: no   No complications documented.   Last Vitals:  Vitals:   03/12/20 1117 03/12/20 1118  BP: (!) 105/39 (!) 109/43  Pulse: 66 62  Resp: (!) 21 (!) 21  Temp:    SpO2: 94% 95%    Last Pain:  Vitals:   03/12/20 1102  TempSrc:   PainSc: Asleep                 Strider Vallance K

## 2020-03-12 NOTE — Transfer of Care (Signed)
Immediate Anesthesia Transfer of Care Note  Patient: Brenda Tucker  Procedure(s) Performed: LAPAROSCOPIC CHOLECYSTECTOMY (N/A )  Patient Location: PACU  Anesthesia Type:General  Level of Consciousness: drowsy and patient cooperative  Airway & Oxygen Therapy: Patient Spontanous Breathing and Patient connected to nasal cannula oxygen  Post-op Assessment: Report given to RN and Post -op Vital signs reviewed and stable  Post vital signs: Reviewed and stable  Last Vitals:  Vitals Value Taken Time  BP 109/61 03/12/20 1017  Temp    Pulse 65 03/12/20 1020  Resp 27 03/12/20 1020  SpO2 95 % 03/12/20 1020  Vitals shown include unvalidated device data.  Last Pain:  Vitals:   03/12/20 0821  TempSrc: Temporal  PainSc: 0-No pain         Complications: No complications documented.

## 2020-03-12 NOTE — Discharge Instructions (Signed)
AMBULATORY SURGERY  °DISCHARGE INSTRUCTIONS ° ° °1) The drugs that you were given will stay in your system until tomorrow so for the next 24 hours you should not: ° °A) Drive an automobile °B) Make any legal decisions °C) Drink any alcoholic beverage ° ° °2) You Awad resume regular meals tomorrow.  Today it is better to start with liquids and gradually work up to solid foods. ° °You Larose eat anything you prefer, but it is better to start with liquids, then soup and crackers, and gradually work up to solid foods. ° ° °3) Please notify your doctor immediately if you have any unusual bleeding, trouble breathing, redness and pain at the surgery site, drainage, fever, or pain not relieved by medication. ° ° ° °4) Additional Instructions: ° ° ° ° ° ° ° °Please contact your physician with any problems or Same Day Surgery at [phone], Monday through Friday 6 am to 4 pm, or [place] [facility] Main number at [phone]. °

## 2020-03-12 NOTE — Progress Notes (Signed)
After duoneb O2 sat remains above 95.  Given IS to use to encourage deep breathing at home

## 2020-03-12 NOTE — Anesthesia Procedure Notes (Signed)
Procedure Name: Intubation Date/Time: 03/12/2020 9:06 AM Performed by: Gayland Curry, CRNA Pre-anesthesia Checklist: Patient identified, Emergency Drugs available, Suction available and Patient being monitored Patient Re-evaluated:Patient Re-evaluated prior to induction Oxygen Delivery Method: Circle system utilized Preoxygenation: Pre-oxygenation with 100% oxygen Induction Type: IV induction Ventilation: Mask ventilation without difficulty and Oral airway inserted - appropriate to patient size Laryngoscope Size: Mac and 3 Grade View: Grade I Tube type: Oral Tube size: 6.5 mm Number of attempts: 1 Secured at: 20 cm Tube secured with: Tape Dental Injury: Teeth and Oropharynx as per pre-operative assessment

## 2020-03-12 NOTE — Anesthesia Preprocedure Evaluation (Signed)
Anesthesia Evaluation  Patient identified by MRN, date of birth, ID band Patient awake    Reviewed: Allergy & Precautions, H&P , NPO status , Patient's Chart, lab work & pertinent test results, reviewed documented beta blocker date and time   History of Anesthesia Complications (+) history of anesthetic complications  Airway Mallampati: II  TM Distance: >3 FB Neck ROM: full    Dental  (+) Edentulous Lower, Edentulous Upper   Pulmonary shortness of breath and with exertion, asthma , COPD,  COPD inhaler,    Pulmonary exam normal        Cardiovascular Exercise Tolerance: Poor hypertension, On Medications negative cardio ROS Normal cardiovascular exam Rhythm:regular Rate:Normal     Neuro/Psych negative neurological ROS  negative psych ROS   GI/Hepatic Neg liver ROS, GERD  Medicated,  Endo/Other  Hypothyroidism   Renal/GU Renal disease  negative genitourinary   Musculoskeletal   Abdominal   Peds  Hematology negative hematology ROS (+)   Anesthesia Other Findings Past Medical History: No date: Arthritis No date: Asthma No date: Cancer ([place])     Comment:  skin, colon polyp No date: Chronic kidney disease No date: Complication of anesthesia     Comment:  asthma attack per pt No date: COPD (chronic obstructive pulmonary disease) (HCC) No date: Dyspnea No date: Hypertension No date: Hypothyroidism Past Surgical History: No date: ABDOMINAL HYSTERECTOMY No date: APPENDECTOMY 07/09/2015: COLONOSCOPY WITH PROPOFOL; N/A     Comment:  Procedure: COLONOSCOPY WITH PROPOFOL;  Surgeon: Manya Silvas, MD;  Location: [facility] ENDOSCOPY;  Service:               Endoscopy;  Laterality: N/A;   Reproductive/Obstetrics negative OB ROS                             Anesthesia Physical Anesthesia Plan  ASA: III  Anesthesia Plan: General ETT   Post-op Pain Management:    Induction:    PONV Risk Score and Plan: 4 or greater  Airway Management Planned:   Additional Equipment:   Intra-op Plan:   Post-operative Plan:   Informed Consent: I have reviewed the patients History and Physical, chart, labs and discussed the procedure including the risks, benefits and alternatives for the proposed anesthesia with the patient or authorized representative who has indicated his/her understanding and acceptance.     Dental Advisory Given  Plan Discussed with: CRNA  Anesthesia Plan Comments:         Anesthesia Quick Evaluation

## 2020-03-12 NOTE — Progress Notes (Signed)
O2 sat 87 upon arrival to post op.  Deep breathing brought it back up to 96.  Sat again dropped to 85, deep breathing brought up to 99.  Dr Ronelle Nigh notified duoneb ordered

## 2020-03-12 NOTE — Interval H&P Note (Signed)
History and Physical Interval Note:  03/12/2020 8:48 AM  Brenda Tucker  has presented today for surgery, with the diagnosis of Sx cholelithiasis.  The various methods of treatment have been discussed with the patient and family. After consideration of risks, benefits and other options for treatment, the patient has consented to  Procedure(s): LAPAROSCOPIC CHOLECYSTECTOMY (N/A) as a surgical intervention.  The patient's history has been reviewed, patient examined, no change in status, stable for surgery.  I have reviewed the patient's chart and labs.  Questions were answered to the patient's satisfaction.     Fredirick Maudlin

## 2020-03-12 NOTE — Op Note (Signed)
Laparoscopic Cholecystectomy  Pre-operative Diagnosis: Symptomatic cholelithiasis  Post-operative Diagnosis: Same  Procedure: Laparoscopic cholecystectomy  Surgeon: Fredirick Maudlin, MD  Anesthesia: GETA  Assistant: None   Findings: Normal-appearing gallbladder without evidence of acute cholecystitis.  Estimated Blood Loss: Less than 10 cc         Drains: None         Specimens: Gallbladder           Complications: none   Procedure Details  The patient was seen again in the preoperative holding area. The benefits, complications, treatment options, and expected outcomes were discussed with the patient. The risks of bleeding, infection, recurrence of symptoms, failure to resolve symptoms, bile duct damage, bile duct leak, retained common bile duct stone, bowel injury, any of which could require further surgery and/or ERCP, stent, or papillotomy were reviewed with the patient. The likelihood of improving the patient's symptoms with return to their baseline status is good.  The patient and/or family concurred with the proposed plan, giving informed consent.  The patient was taken to operating room, identified as Brenda Tucker and the procedure verified as Laparoscopic Cholecystectomy. A time out was performed and the above information confirmed.  Prior to the induction of general anesthesia, antibiotic prophylaxis was administered. VTE prophylaxis was in place. General endotracheal anesthesia was then administered and tolerated well. After the induction, the abdomen was prepped with Chloraprep and draped in the usual sterile fashion. The patient was positioned in the supine position.  Optiview technique was used to enter the abdominal cavity in the right upper quadrant via a 5 mm trocar.  Pneumoperitoneum was then created with CO2 and tolerated well without any adverse changes in the patient's vital signs.  A 10 mm infraumbilical port as well as 2 additional right upper quadrant 5-mm ports  were placed, all under direct vision. All skin incisions  were infiltrated with a local anesthetic agent before making the incision and placing the trocars.   The patient was positioned  in reverse Trendelenburg, tilted slightly to the patient's left.  The gallbladder was identified, the fundus grasped and retracted cephalad. Adhesions were lysed bluntly. The infundibulum was grasped and retracted laterally, exposing the peritoneum overlying the triangle of Calot. This was then divided and exposed in a blunt fashion. An extended critical view of the cystic duct and cystic artery was obtained.  The cystic duct was clearly identified and bluntly dissected free. Both the cystic artery and duct were double clipped and divided.  The gallbladder was taken from the gallbladder fossa in a retrograde fashion with the electrocautery. The gallbladder was removed and placed in an Endopouch bag. The liver bed was irrigated and inspected. Hemostasis was achieved with the electrocautery. Copious saline irrigation was utilized and was repeatedly aspirated until clear.  The gallbladder and Endopouch sac were then removed through a port site.    Inspection of the right upper quadrant was performed. No bleeding, bile duct injury or leak, or bowel injury was noted. Pneumoperitoneum was released.  The periumbilical port site was closed with interrumpted 0 Vicryl sutures. 4-0 subcuticular Monocryl was used to close the skin. Dermabond was applied.  The patient was then extubated and brought to the recovery room in stable condition. Sponge, lap, and needle counts were correct at closure and at the conclusion of the case.               Fredirick Maudlin, MD, FACS

## 2020-03-16 LAB — SURGICAL PATHOLOGY

## 2020-03-29 ENCOUNTER — Encounter: Payer: Self-pay | Admitting: Physician Assistant

## 2020-03-29 ENCOUNTER — Ambulatory Visit (INDEPENDENT_AMBULATORY_CARE_PROVIDER_SITE_OTHER): Payer: PPO | Admitting: Physician Assistant

## 2020-03-29 ENCOUNTER — Other Ambulatory Visit: Payer: Self-pay

## 2020-03-29 VITALS — BP 114/68 | HR 67 | Temp 98.2°F | Resp 14 | Ht <= 58 in | Wt 135.0 lb

## 2020-03-29 DIAGNOSIS — K8020 Calculus of gallbladder without cholecystitis without obstruction: Secondary | ICD-10-CM

## 2020-03-29 DIAGNOSIS — Z09 Encounter for follow-up examination after completed treatment for conditions other than malignant neoplasm: Secondary | ICD-10-CM

## 2020-03-29 NOTE — Patient Instructions (Addendum)
You Lapierre try using a fiber supplement daily to help with the diarrhea and constipation. You Mckeag use fiber gummies or fiber powder. Be sure to increase you water intake also.   GENERAL POST-OPERATIVE PATIENT INSTRUCTIONS   WOUND CARE INSTRUCTIONS:Try to keep the wound dry and avoid ointments on the wound unless directed to do so.  If the wound becomes bright red and painful or starts to drain infected material that is not clear, please contact your physician immediately.  If the wound is mildly pink and has a thick firm ridge underneath it, this is normal, and is referred to as a healing ridge.  This will resolve over the next 4-6 weeks.  BATHING: You Reicks shower if you have been informed of this by your surgeon. However, Please do not submerge in a tub, hot tub, or pool until incisions are completely sealed or have been told by your surgeon that you Stange do so.  DIET:  You Kittel eat any foods that you can tolerate.  It is a good idea to eat a high fiber diet and take in plenty of fluids to prevent constipation.  If you do become constipated you Crate want to take a mild laxative or take ducolax tablets on a daily basis until your bowel habits are regular.  Constipation can be very uncomfortable, along with straining, after recent surgery.  ACTIVITY:  You Charter want to hug a pillow when coughing and sneezing to add additional support to the surgical area, if you had abdominal or chest surgery, which will decrease pain during these times.  You are encouraged to walk and engage in light activity for the next two weeks.  You should not lift more than 20 pounds for 6 weeks after surgery as it could put you at increased risk for complications.  Twenty pounds is roughly equivalent to a plastic bag of groceries. At that time- Listen to your body when lifting, if you have pain when lifting, stop and then try again in a few days. Soreness after doing exercises or activities of daily living is normal as you get back in to  your normal routine.  MEDICATIONS:  Try to take narcotic medications and anti-inflammatory medications, such as tylenol, ibuprofen, naprosyn, etc., with food.  This will minimize stomach upset from the medication.  Should you develop nausea and vomiting from the pain medication, or develop a rash, please discontinue the medication and contact your physician.  You should not drive, make important decisions, or operate machinery when taking narcotic pain medication.  SUNBLOCK Use sun block to incision area over the next year if this area will be exposed to sun. This helps decrease scarring and will allow you avoid a permanent darkened area over your incision.  QUESTIONS:  Please feel free to call our office if you have any questions, and we will be glad to assist you.

## 2020-03-29 NOTE — Progress Notes (Signed)
[facility] POST-OP OFFICE VISIT  03/29/2020  HPI: Brenda Tucker is a 86 y.o. female 17 days s/p laparoscopic cholecystectomy for symptomatic cholelithiasis with Dr Celine Ahr.   She is doing well Intermittent, mild abdominal pains, controlled with Tylenol No fever, chills, nausea, emesis She reports intermittent diarrhea but this is her "normal" and was occurring prior to her operation Mobilizing well Tolerating PO without issues No other complaints  Vital signs: BP 114/68   Pulse 67   Temp 98.2 F (36.8 C)   Resp 14   Ht 4\' 8"  (1.422 m)   Wt 135 lb (61.2 kg)   SpO2 94%   BMI 30.27 kg/m    Physical Exam: Constitutional: Well appearing female, NAD Abdomen: Soft, non-tender, non-distended, no rebound/guarding Skin: Laparoscopic incisions are well healed, no erythema or drainage   Assessment/Plan: This is a 86 y.o. female 17 days s/p laparoscopic cholecystectomy for symptomatic cholelithiasis   - pain control prn with Tylenol  - reviewed lifting restrictions; 2 more weeks  - Reviewed pathology; [place]  - RTC PRN  -- Edison Simon, PA-C [facility] 03/29/2020, 10:11 AM [phone] M-F: 7am - 4pm

## 2020-04-05 ENCOUNTER — Other Ambulatory Visit: Payer: Self-pay | Admitting: Nurse Practitioner

## 2020-04-05 DIAGNOSIS — Z1231 Encounter for screening mammogram for malignant neoplasm of breast: Secondary | ICD-10-CM

## 2020-04-16 ENCOUNTER — Other Ambulatory Visit: Payer: PPO

## 2020-05-05 NOTE — Progress Notes (Signed)
Scanned in pt sx clear signed form.

## 2020-05-06 ENCOUNTER — Ambulatory Visit
Admission: RE | Admit: 2020-05-06 | Discharge: 2020-05-06 | Disposition: A | Discharge status: Home / Self Care | Payer: PPO | Source: Ambulatory Visit | Attending: Nurse Practitioner | Admitting: Nurse Practitioner

## 2020-05-06 ENCOUNTER — Other Ambulatory Visit: Payer: Self-pay

## 2020-05-06 DIAGNOSIS — Z1231 Encounter for screening mammogram for malignant neoplasm of breast: Secondary | ICD-10-CM | POA: Diagnosis not present

## 2020-05-18 ENCOUNTER — Ambulatory Visit (INDEPENDENT_AMBULATORY_CARE_PROVIDER_SITE_OTHER): Payer: PPO | Admitting: Hospice and Palliative Medicine

## 2020-05-18 ENCOUNTER — Encounter: Payer: Self-pay | Admitting: Hospice and Palliative Medicine

## 2020-05-18 ENCOUNTER — Other Ambulatory Visit: Payer: Self-pay

## 2020-05-18 DIAGNOSIS — R600 Localized edema: Secondary | ICD-10-CM | POA: Diagnosis not present

## 2020-05-18 DIAGNOSIS — I4510 Unspecified right bundle-branch block: Secondary | ICD-10-CM | POA: Diagnosis not present

## 2020-05-18 DIAGNOSIS — I10 Essential (primary) hypertension: Secondary | ICD-10-CM

## 2020-05-18 DIAGNOSIS — K591 Functional diarrhea: Secondary | ICD-10-CM

## 2020-05-18 NOTE — Progress Notes (Signed)
[facility] [place],  [address]  Internal MEDICINE  Office Visit Note  Patient Name: Brenda Tucker  [id number]  [id number]  Date of Service: 05/21/2020  Chief Complaint  Patient presents with  . Hypertension    3 month fup  . Hyperlipidemia  . COPD    HPI Patient is here for routine follow-up She is complaining today of frequent diarrhea--she has noticed since having her gallbladder removed she has frequent diarrhea, also feels it Mayol be related to her cholesterol medicine as it started close to starting it She was told while having work-up done for her recent cholecystectomy her EKG was abnormal--unsure what the abnormality was  Has not been seen by cardiology before Echocardiogram 2019 aortic calcifications as well as stenosis Reports shortness of breath with regular activity and has noticed bilateral ankle edema  Followed by nephrology for hypertensive CKD Stage 3b-stable at this time without proteinuria or hematuria, continue current BP therapy   Current Medication: Outpatient Encounter Medications as of 05/18/2020  Medication Sig  . acetaminophen (TYLENOL) 500 MG tablet Take 500 mg by mouth every 6 (six) hours as needed for moderate pain or headache.  . Albuterol Sulfate (PROAIR RESPICLICK) 295 (90 Base) MCG/ACT AEPB Inhale 2 puffs into the lungs every 4 (four) hours as needed. (Patient taking differently: Inhale 2 puffs into the lungs every 4 (four) hours as needed (shortness of breath). )  . amLODipine (NORVASC) 5 MG tablet Take 1 tablet (5 mg total) by mouth daily.  Marland Kitchen aspirin EC 81 MG tablet Take 81 mg by mouth daily.  Marland Kitchen atorvastatin (LIPITOR) 10 MG tablet Take 1 tablet (10 mg total) by mouth daily. (Patient taking differently: Take 10 mg by mouth every evening. )  . calcium carbonate (OSCAL) 1500 (600 Ca) MG TABS tablet Take 600 mg of elemental calcium by mouth daily.  . carvedilol (COREG) 12.5 MG tablet Take 1 tablet (12.5 mg total) by  mouth 2 (two) times daily with a meal.  . Cholecalciferol (VITAMIN D) 50 MCG (2000 UT) tablet Take 2,000 Units by mouth daily.  . Fluticasone-Salmeterol (ADVAIR) 250-50 MCG/DOSE AEPB Inhale 1 puff into the lungs 2 (two) times daily.  Marland Kitchen latanoprost (XALATAN) 0.005 % ophthalmic solution Place 1 drop into both eyes at bedtime.  Marland Kitchen levothyroxine (SYNTHROID) 50 MCG tablet Take 1 tablet (50 mcg total) by mouth daily before breakfast.  . losartan (COZAAR) 100 MG tablet Take 100 mg by mouth every evening.   . Multiple Vitamins-Minerals (OCUVITE PO) Take 1 tablet by mouth daily.  . traMADol (ULTRAM) 50 MG tablet Take 1 tablet (50 mg total) by mouth every 6 (six) hours as needed.  . vitamin B-12 (CYANOCOBALAMIN) 1000 MCG tablet Take 1,000 mcg by mouth daily.   No facility-administered encounter medications on file as of 05/18/2020.    Surgical History: Past Surgical History:  Procedure Laterality Date  . ABDOMINAL HYSTERECTOMY    . APPENDECTOMY    . CHOLECYSTECTOMY N/A 03/12/2020   Procedure: LAPAROSCOPIC CHOLECYSTECTOMY;  Surgeon: Fredirick Maudlin, MD;  Location: [facility] ORS;  Service: General;  Laterality: N/A;  . COLONOSCOPY WITH PROPOFOL N/A 07/09/2015   Procedure: COLONOSCOPY WITH PROPOFOL;  Surgeon: Manya Silvas, MD;  Location: [facility] ENDOSCOPY;  Service: Endoscopy;  Laterality: N/A;    Medical History: Past Medical History:  Diagnosis Date  . Arthritis   . Asthma   . Cancer (HCC)    skin, colon polyp  . Chronic kidney disease   . Complication of  anesthesia    asthma attack per pt  . COPD (chronic obstructive pulmonary disease) ([place])   . Dyspnea   . Hypertension   . Hypothyroidism     Family History: Family History  Problem Relation Age of Onset  . Heart attack Father   . Early death Sister        MVA  . Stomach cancer Brother   . Cancer Brother        shoulder  . Lung cancer Brother   . Cancer Sister   . Cancer Sister   . Early death Sister        husband killed her  .  Breast cancer Neg Hx     Social History   Socioeconomic History  . Marital status: Widowed    Spouse name: Not on file  . Number of children: Not on file  . Years of education: Not on file  . Highest education level: Not on file  Occupational History  . Not on file  Tobacco Use  . Smoking status: Never Smoker  . Smokeless tobacco: Never Used  . Tobacco comment: second hand smoke  Vaping Use  . Vaping Use: Never used  Substance and Sexual Activity  . Alcohol use: No  . Drug use: No  . Sexual activity: Not on file  Other Topics Concern  . Not on file  Social History Narrative  . Not on file   Social Determinants of Health   Financial Resource Strain:   . Difficulty of Paying Living Expenses: Not on file  Food Insecurity:   . Worried About Charity fundraiser in the Last Year: Not on file  . Ran Out of Food in the Last Year: Not on file  Transportation Needs:   . Lack of Transportation (Medical): Not on file  . Lack of Transportation (Non-Medical): Not on file  Physical Activity:   . Days of Exercise per Week: Not on file  . Minutes of Exercise per Session: Not on file  Stress:   . Feeling of Stress : Not on file  Social Connections:   . Frequency of Communication with Friends and Family: Not on file  . Frequency of Social Gatherings with Friends and Family: Not on file  . Attends Religious Services: Not on file  . Active Member of Clubs or Organizations: Not on file  . Attends Archivist Meetings: Not on file  . Marital Status: Not on file  Intimate Partner Violence:   . Fear of Current or Ex-Partner: Not on file  . Emotionally Abused: Not on file  . Physically Abused: Not on file  . Sexually Abused: Not on file      Review of Systems  Constitutional: Negative for chills, diaphoresis and fatigue.  HENT: Negative for ear pain, postnasal drip and sinus pressure.   Eyes: Negative for photophobia, discharge, redness, itching and visual disturbance.   Respiratory: Positive for shortness of breath. Negative for cough and wheezing.   Cardiovascular: Positive for leg swelling. Negative for chest pain and palpitations.  Gastrointestinal: Positive for diarrhea. Negative for abdominal pain, constipation, nausea and vomiting.  Genitourinary: Negative for dysuria and flank pain.  Musculoskeletal: Negative for arthralgias, back pain, gait problem and neck pain.  Skin: Negative for color change.  Allergic/Immunologic: Negative for environmental allergies and food allergies.  Neurological: Negative for dizziness and headaches.  Hematological: Does not bruise/bleed easily.  Psychiatric/Behavioral: Negative for agitation, behavioral problems (depression) and hallucinations.    Vital Signs: BP 138/72  Pulse 74   Temp 98.1 F (36.7 C)   Resp 16   Ht 4\' 8"  (1.422 m)   Wt 135 lb 6.4 oz (61.4 kg)   SpO2 95%   BMI 30.36 kg/m    Physical Exam Vitals reviewed.  Constitutional:      Appearance: Normal appearance. She is normal weight.  Cardiovascular:     Rate and Rhythm: Normal rate and regular rhythm.     Pulses: Normal pulses.     Heart sounds: Normal heart sounds.  Pulmonary:     Effort: Pulmonary effort is normal.     Breath sounds: Normal breath sounds.  Musculoskeletal:        General: Normal range of motion.     Cervical back: Normal range of motion.     Right lower leg: 1+ Pitting Edema present.     Left lower leg: 1+ Pitting Edema present.  Skin:    General: Skin is warm.  Neurological:     General: No focal deficit present.     Mental Status: She is alert and oriented to person, place, and time. Mental status is at baseline.  Psychiatric:        Mood and Affect: Mood normal.        Behavior: Behavior normal.        Thought Content: Thought content normal.    Assessment/Plan: 1. Hypertension, unspecified type BP and HR well controlled today on current therapy--continue with routine monitoring  2. RBBB New on EKG  from 9/13 - Ambulatory referral to Cardiology  3. Bilateral lower extremity edema Abnormal echo from 2019-with DOE as well as lower extremity edema and RBBB on EKG referral sent to cardiology for further evaluation and management - Ambulatory referral to Cardiology  4. Functional diarrheae Possible continued from gallbladder complications, Laser also be secondary to statin therapy which was started near the same time diarrhea occurred. Discussed the risk and benefits related to statin therapy. At this time agreed to stop statin therapy for 1-2 weeks to see if diarrhea improves.  General Counseling: Brenda Tucker verbalizes understanding of the findings of todays visit and agrees with plan of treatment. I have discussed any further diagnostic evaluation that Georgiou be needed or ordered today. We also reviewed her medications today. she has been encouraged to call the office with any questions or concerns that should arise related to todays visit.    Orders Placed This Encounter  Procedures  . Ambulatory referral to Cardiology    Time spent: 30 Minutes Time spent includes review of chart, medications, test results and follow-up plan with the patient.  This patient was seen by Theodoro Grist AGNP-C in Collaboration with Dr Lavera Guise as a part of collaborative care agreement     Tanna Furry. Kesa Birky AGNP-C Internal medicine

## 2020-05-21 ENCOUNTER — Encounter: Payer: Self-pay | Admitting: Cardiology

## 2020-05-21 ENCOUNTER — Encounter: Payer: Self-pay | Admitting: Hospice and Palliative Medicine

## 2020-05-21 ENCOUNTER — Ambulatory Visit: Payer: PPO | Admitting: Cardiology

## 2020-05-21 ENCOUNTER — Other Ambulatory Visit: Payer: Self-pay

## 2020-05-21 VITALS — BP 130/70 | HR 67 | Ht <= 58 in | Wt 136.2 lb

## 2020-05-21 DIAGNOSIS — R600 Localized edema: Secondary | ICD-10-CM

## 2020-05-21 DIAGNOSIS — R0600 Dyspnea, unspecified: Secondary | ICD-10-CM

## 2020-05-21 DIAGNOSIS — I10 Essential (primary) hypertension: Secondary | ICD-10-CM

## 2020-05-21 DIAGNOSIS — I4510 Unspecified right bundle-branch block: Secondary | ICD-10-CM | POA: Diagnosis not present

## 2020-05-21 DIAGNOSIS — R011 Cardiac murmur, unspecified: Secondary | ICD-10-CM

## 2020-05-21 DIAGNOSIS — R0609 Other forms of dyspnea: Secondary | ICD-10-CM

## 2020-05-21 NOTE — Patient Instructions (Signed)

## 2020-05-21 NOTE — Progress Notes (Signed)
Cardiology Office Note:    Date:  05/21/2020   ID:  Brenda Tucker, DOB 1934-04-15, MRN [id number]  PCP:  Ronnell Freshwater, NP  [facility] Cardiologist:  Kate Sable, MD  [place] Electrophysiologist:  None   Referring MD: Luiz Ochoa, NP   Chief Complaint  Patient presents with  . New Patient (Initial Visit)    Ref. by Dr. Kenton Kingfisher for RBBB, LE edema and shortness of breath on exertion. Meds reviewed by the pt. verbally.    Brenda Tucker is a 86 y.o. female who is being seen today for the evaluation of abnormal EKG, shortness of breath at the request of Luiz Ochoa, NP.   History of Present Illness:    Brenda Tucker is a 86 y.o. female with a hx of hypertension, hyperlipidemia, CKD 3, COPD, never smoker who presents due to shortness of breath.  Patient states having worsening shortness of breath over the last couple of months with exertion.  Denies chest pain.  Has been told she has a cardiac murmur in the past.  Also endorses lower extremity edema which has stayed roughly the same over the last several years.  Saw her primary care provider where EKG showed right bundle branch block.  She denies palpitations, presyncope or syncope.  Recently started on Lipitor, but was stopped a day ago due to diarrhea.  Outside echocardiogram on 01/2018 showed normal systolic function, EF 32%, aortic valve calcification.  Past Medical History:  Diagnosis Date  . Arthritis   . Asthma   . Cancer (HCC)    skin, colon polyp  . Chronic kidney disease   . Complication of anesthesia    asthma attack per pt  . COPD (chronic obstructive pulmonary disease) ([place])   . Dyspnea   . Hypertension   . Hypothyroidism     Past Surgical History:  Procedure Laterality Date  . ABDOMINAL HYSTERECTOMY    . APPENDECTOMY    . CHOLECYSTECTOMY N/A 03/12/2020   Procedure: LAPAROSCOPIC CHOLECYSTECTOMY;  Surgeon: Fredirick Maudlin, MD;  Location: [facility] ORS;  Service: General;  Laterality: N/A;  .  COLONOSCOPY WITH PROPOFOL N/A 07/09/2015   Procedure: COLONOSCOPY WITH PROPOFOL;  Surgeon: Manya Silvas, MD;  Location: [facility] ENDOSCOPY;  Service: Endoscopy;  Laterality: N/A;    Current Medications: Current Meds  Medication Sig  . acetaminophen (TYLENOL) 500 MG tablet Take 500 mg by mouth every 6 (six) hours as needed for moderate pain or headache.  . Albuterol Sulfate (PROAIR RESPICLICK) 355 (90 Base) MCG/ACT AEPB Inhale 2 puffs into the lungs every 4 (four) hours as needed.  Marland Kitchen amLODipine (NORVASC) 5 MG tablet Take 1 tablet (5 mg total) by mouth daily.  Marland Kitchen aspirin EC 81 MG tablet Take 81 mg by mouth daily.  Marland Kitchen atorvastatin (LIPITOR) 10 MG tablet Take 1 tablet (10 mg total) by mouth daily.  . calcium carbonate (OSCAL) 1500 (600 Ca) MG TABS tablet Take 600 mg of elemental calcium by mouth daily.  . carvedilol (COREG) 12.5 MG tablet Take 1 tablet (12.5 mg total) by mouth 2 (two) times daily with a meal.  . Cholecalciferol (VITAMIN D) 50 MCG (2000 UT) tablet Take 2,000 Units by mouth daily.  . Fluticasone-Salmeterol (ADVAIR) 250-50 MCG/DOSE AEPB Inhale 1 puff into the lungs 2 (two) times daily.  Marland Kitchen latanoprost (XALATAN) 0.005 % ophthalmic solution Place 1 drop into both eyes at bedtime.  Marland Kitchen levothyroxine (SYNTHROID) 50 MCG tablet Take 1 tablet (50 mcg total) by mouth daily before  breakfast.  . losartan (COZAAR) 100 MG tablet Take 100 mg by mouth every evening.   . Multiple Vitamins-Minerals (OCUVITE PO) Take 1 tablet by mouth daily.  . traMADol (ULTRAM) 50 MG tablet Take 1 tablet (50 mg total) by mouth every 6 (six) hours as needed.  . vitamin B-12 (CYANOCOBALAMIN) 1000 MCG tablet Take 1,000 mcg by mouth daily.     Allergies:   Theodrenaline   Social History   Socioeconomic History  . Marital status: Widowed    Spouse name: Not on file  . Number of children: Not on file  . Years of education: Not on file  . Highest education level: Not on file  Occupational History  . Not on file   Tobacco Use  . Smoking status: Never Smoker  . Smokeless tobacco: Never Used  . Tobacco comment: second hand smoke  Vaping Use  . Vaping Use: Never used  Substance and Sexual Activity  . Alcohol use: No  . Drug use: No  . Sexual activity: Not on file  Other Topics Concern  . Not on file  Social History Narrative  . Not on file   Social Determinants of Health   Financial Resource Strain:   . Difficulty of Paying Living Expenses: Not on file  Food Insecurity:   . Worried About Charity fundraiser in the Last Year: Not on file  . Ran Out of Food in the Last Year: Not on file  Transportation Needs:   . Lack of Transportation (Medical): Not on file  . Lack of Transportation (Non-Medical): Not on file  Physical Activity:   . Days of Exercise per Week: Not on file  . Minutes of Exercise per Session: Not on file  Stress:   . Feeling of Stress : Not on file  Social Connections:   . Frequency of Communication with Friends and Family: Not on file  . Frequency of Social Gatherings with Friends and Family: Not on file  . Attends Religious Services: Not on file  . Active Member of Clubs or Organizations: Not on file  . Attends Archivist Meetings: Not on file  . Marital Status: Not on file     Family History: The patient's family history includes Cancer in her brother, sister, and sister; Early death in her sister and sister; Heart attack in her father; Lung cancer in her brother; Stomach cancer in her brother. There is no history of Breast cancer.  ROS:   Please see the history of present illness.     All other systems reviewed and are negative.  EKGs/Labs/Other Studies Reviewed:    The following studies were reviewed today:   EKG:  EKG is  ordered today.  The ekg ordered today demonstrates sinus rhythm, right bundle branch block, left anterior fascicular block  Recent Labs: 12/31/2019: ALT 8; BUN 24; Creatinine, Ser 1.39; Hemoglobin 11.9; Platelets 265; Potassium  4.6; Sodium 138; TSH 2.150  Recent Lipid Panel    Component Value Date/Time   CHOL 212 (H) 12/31/2019 0853   TRIG 125 12/31/2019 0853   HDL 60 12/31/2019 0853   LDLCALC 130 (H) 12/31/2019 0853     Risk Assessment/Calculations:      Physical Exam:    VS:  BP 130/70 (BP Location: Right Arm, Patient Position: Sitting, Cuff Size: Normal)   Pulse 67   Ht 4\' 8"  (1.422 m)   Wt 136 lb 4 oz (61.8 kg)   SpO2 98%   BMI 30.55 kg/m  Wt Readings from Last 3 Encounters:  05/21/20 136 lb 4 oz (61.8 kg)  05/18/20 135 lb 6.4 oz (61.4 kg)  03/29/20 135 lb (61.2 kg)     GEN:  Well nourished, well developed in no acute distress HEENT: Normal NECK: No JVD; No carotid bruits LYMPHATICS: No lymphadenopathy CARDIAC: RRR, 2/6 systolic murmur RESPIRATORY:  Clear to auscultation without rales, wheezing or rhonchi  ABDOMEN: Soft, non-tender, non-distended MUSCULOSKELETAL:  trace edema; No deformity  SKIN: Warm and dry NEUROLOGIC:  Alert and oriented x 3 PSYCHIATRIC:  Normal affect   ASSESSMENT:    1. Dyspnea on exertion   2. Primary hypertension   3. Systolic murmur   4. Leg edema   5. RBBB    PLAN:    In order of problems listed above:  1. Patient with dyspnea on exertion.  She has history of COPD which could be contributing.  Also has a history of aortic calcification such worsening valvular disease in the etiology.  Get echocardiogram.  Depending on findings on echo and persistent of symptoms, [place] consider stress test/Myoview. 2. History of hypertension, BP controlled.  Will hold amlodipine due to lower extremity edema.  Consider increasing dose of Coreg if BP becomes elevated. 3. Systolic murmur, aortic valve calcifications on prior echo.  Echocardiogram as above to evaluate valvular disease. 4. Lower extremity edema, hold amlodipine. 5. Right bundle branch block noted on EKG, left anterior fascicular block.  No symptoms of presyncope, bradycardia.  No indication for permanent  pacemaker.  Continue to monitor.  Follow-up after echo    Shared Decision Making/Informed Consent       Medication Adjustments/Labs and Tests Ordered: Current medicines are reviewed at length with the patient today.  Concerns regarding medicines are outlined above.  Orders Placed This Encounter  Procedures  . EKG 12-Lead  . ECHOCARDIOGRAM COMPLETE   No orders of the defined types were placed in this encounter.   Patient Instructions  Medication Instructions:  Your physician recommends that you continue on your current medications as directed. Please refer to the Current Medication list given to you today. *If you need a refill on your cardiac medications before your next appointment, please call your pharmacy*   Lab Work: None Ordered If you have labs (blood work) drawn today and your tests are completely normal, you will receive your results only by: Marland Kitchen MyChart Message (if you have MyChart) OR . A paper copy in the mail If you have any lab test that is abnormal or we need to change your treatment, we will call you to review the results.   Testing/Procedures:  Your physician has requested that you have an echocardiogram. Echocardiography is a painless test that uses sound waves to create images of your heart. It provides your doctor with information about the size and shape of your heart and how well your heart's chambers and valves are working. This procedure takes approximately one hour. There are no restrictions for this procedure.    Follow-Up: At [facility], you and your health needs are our priority.  As part of our continuing mission to provide you with exceptional heart care, we have created designated Provider Care Teams.  These Care Teams include your primary Cardiologist (physician) and Advanced Practice Providers (APPs -  Physician Assistants and Nurse Practitioners) who all work together to provide you with the care you need, when you need it.  We recommend  signing up for the patient portal called "MyChart".  Sign up information is provided  on this After Visit Summary.  MyChart is used to connect with patients for Virtual Visits (Telemedicine).  Patients are able to view lab/test results, encounter notes, upcoming appointments, etc.  Non-urgent messages can be sent to your provider as well.   To learn more about what you can do with MyChart, go to [email or web address].    Your next appointment:   Follow up after Echo   The format for your next appointment:   In Person  Provider:   Kate Sable, MD   Other Instructions      Signed, Kate Sable, MD  05/21/2020 12:51 PM    [place]

## 2020-06-16 ENCOUNTER — Other Ambulatory Visit: Payer: Self-pay

## 2020-06-16 ENCOUNTER — Ambulatory Visit (INDEPENDENT_AMBULATORY_CARE_PROVIDER_SITE_OTHER): Payer: PPO

## 2020-06-16 DIAGNOSIS — R0609 Other forms of dyspnea: Secondary | ICD-10-CM

## 2020-06-16 DIAGNOSIS — R0600 Dyspnea, unspecified: Secondary | ICD-10-CM

## 2020-06-16 LAB — ECHOCARDIOGRAM COMPLETE
AR max vel: 0.99 cm2
AV Area VTI: 0.96 cm2
AV Area mean vel: 0.98 cm2
AV Mean grad: 17 mmHg
AV Peak grad: 29.9 mmHg
Ao pk vel: 2.74 m/s
Area-P 1/2: 3.6 cm2
Calc EF: 55.2 %
S' Lateral: 2.8 cm
Single Plane A2C EF: 52.3 %
Single Plane A4C EF: 53.5 %

## 2020-06-18 ENCOUNTER — Telehealth: Payer: Self-pay

## 2020-06-18 MED ORDER — FUROSEMIDE 20 MG PO TABS: 20.0000 mg | ORAL_TABLET | Freq: Every day | ORAL | 3 refills | Status: DC

## 2020-06-18 NOTE — Telephone Encounter (Signed)
Spoke to patient and gave her the following result note as copied below:  Normal systolic function, grade 2 diastolic dysfunction, mild to moderate aortic valve stenosis. Aortic calcification. Keep follow-up appointment. Start patient on Lasix 20 mg daily to help with edema and possible shortness of breath. Keep follow-up appointment.  Patient verbalized understanding and agreed with plan. She confirmed that she will be here for her follow up on Monday 06/21/20.

## 2020-06-21 ENCOUNTER — Other Ambulatory Visit: Payer: Self-pay

## 2020-06-21 ENCOUNTER — Encounter: Payer: Self-pay | Admitting: Cardiology

## 2020-06-21 ENCOUNTER — Ambulatory Visit: Payer: PPO | Admitting: Cardiology

## 2020-06-21 VITALS — BP 148/70 | HR 77 | Ht <= 58 in | Wt 132.5 lb

## 2020-06-21 DIAGNOSIS — R600 Localized edema: Secondary | ICD-10-CM

## 2020-06-21 DIAGNOSIS — R0600 Dyspnea, unspecified: Secondary | ICD-10-CM | POA: Diagnosis not present

## 2020-06-21 DIAGNOSIS — I10 Essential (primary) hypertension: Secondary | ICD-10-CM | POA: Diagnosis not present

## 2020-06-21 DIAGNOSIS — R079 Chest pain, unspecified: Secondary | ICD-10-CM | POA: Diagnosis not present

## 2020-06-21 DIAGNOSIS — R0609 Other forms of dyspnea: Secondary | ICD-10-CM

## 2020-06-21 DIAGNOSIS — I350 Nonrheumatic aortic (valve) stenosis: Secondary | ICD-10-CM | POA: Diagnosis not present

## 2020-06-21 NOTE — Progress Notes (Signed)
Cardiology Office Note:    Date:  06/21/2020   ID:  Brenda Tucker, DOB 12-13-1933, MRN [id number]  PCP:  Ronnell Freshwater, NP  [facility] Cardiologist:  Kate Sable, MD  [place] Electrophysiologist:  None   Referring MD: Ronnell Freshwater, NP   Chief Complaint  Patient presents with  . Other    F/u echo. Meds reviewed verbally with pt.    History of Present Illness:    Brenda Tucker is a 86 y.o. female with a hx of hypertension, hyperlipidemia, CKD 3, COPD, never smoker who presents for follow-up.  Previously seen due to shortness of breath, edema and systolic murmur.  Echocardiogram was performed to evaluate any significant valvular abnormalities.  Her shortness of breath was deemed secondary to COPD.  Amlodipine was stopped to help with edema.  She now presents for follow-up.  She still has shortness of breath with exertion, sometimes associated with chest tightness.  Her symptoms resolved with rest.  Prior notes Outside echocardiogram on 01/2018 showed normal systolic function, EF 65%, aortic valve calcification. Stop Lipitor in the past due to diarrhea.  Past Medical History:  Diagnosis Date  . Arthritis   . Asthma   . Cancer (HCC)    skin, colon polyp  . Chronic kidney disease   . Complication of anesthesia    asthma attack per pt  . COPD (chronic obstructive pulmonary disease) ([place])   . Dyspnea   . Hypertension   . Hypothyroidism     Past Surgical History:  Procedure Laterality Date  . ABDOMINAL HYSTERECTOMY    . APPENDECTOMY    . CHOLECYSTECTOMY N/A 03/12/2020   Procedure: LAPAROSCOPIC CHOLECYSTECTOMY;  Surgeon: Fredirick Maudlin, MD;  Location: [facility] ORS;  Service: General;  Laterality: N/A;  . COLONOSCOPY WITH PROPOFOL N/A 07/09/2015   Procedure: COLONOSCOPY WITH PROPOFOL;  Surgeon: Manya Silvas, MD;  Location: [facility] ENDOSCOPY;  Service: Endoscopy;  Laterality: N/A;    Current Medications: Current Meds  Medication Sig  . acetaminophen (TYLENOL)  500 MG tablet Take 500 mg by mouth every 6 (six) hours as needed for moderate pain or headache.  . Albuterol Sulfate (PROAIR RESPICLICK) 784 (90 Base) MCG/ACT AEPB Inhale 2 puffs into the lungs every 4 (four) hours as needed.  Marland Kitchen aspirin EC 81 MG tablet Take 81 mg by mouth daily.  . calcium carbonate (OSCAL) 1500 (600 Ca) MG TABS tablet Take 600 mg of elemental calcium by mouth daily.  . carvedilol (COREG) 12.5 MG tablet Take 1 tablet (12.5 mg total) by mouth 2 (two) times daily with a meal.  . Cholecalciferol (VITAMIN D) 50 MCG (2000 UT) tablet Take 2,000 Units by mouth daily.  . Fluticasone-Salmeterol (ADVAIR) 250-50 MCG/DOSE AEPB Inhale 1 puff into the lungs 2 (two) times daily.  . furosemide (LASIX) 20 MG tablet Take 1 tablet (20 mg total) by mouth daily.  Marland Kitchen latanoprost (XALATAN) 0.005 % ophthalmic solution Place 1 drop into both eyes at bedtime.  Marland Kitchen levothyroxine (SYNTHROID) 50 MCG tablet Take 1 tablet (50 mcg total) by mouth daily before breakfast.  . losartan (COZAAR) 100 MG tablet Take 100 mg by mouth every evening.   . Multiple Vitamins-Minerals (OCUVITE PO) Take 1 tablet by mouth daily.  . traMADol (ULTRAM) 50 MG tablet Take 1 tablet (50 mg total) by mouth every 6 (six) hours as needed.  . vitamin B-12 (CYANOCOBALAMIN) 1000 MCG tablet Take 1,000 mcg by mouth daily.     Allergies:   Theodrenaline   Social  History   Socioeconomic History  . Marital status: Widowed    Spouse name: Not on file  . Number of children: Not on file  . Years of education: Not on file  . Highest education level: Not on file  Occupational History  . Not on file  Tobacco Use  . Smoking status: Never Smoker  . Smokeless tobacco: Never Used  . Tobacco comment: second hand smoke  Vaping Use  . Vaping Use: Never used  Substance and Sexual Activity  . Alcohol use: No  . Drug use: No  . Sexual activity: Not on file  Other Topics Concern  . Not on file  Social History Narrative  . Not on file   Social  Determinants of Health   Financial Resource Strain: Not on file  Food Insecurity: Not on file  Transportation Needs: Not on file  Physical Activity: Not on file  Stress: Not on file  Social Connections: Not on file     Family History: The patient's family history includes Cancer in her brother, sister, and sister; Early death in her sister and sister; Heart attack in her father; Lung cancer in her brother; Stomach cancer in her brother. There is no history of Breast cancer.  ROS:   Please see the history of present illness.     All other systems reviewed and are negative.  EKGs/Labs/Other Studies Reviewed:    The following studies were reviewed today:   EKG:  EKG not ordered today.    Recent Labs: 12/31/2019: ALT 8; BUN 24; Creatinine, Ser 1.39; Hemoglobin 11.9; Platelets 265; Potassium 4.6; Sodium 138; TSH 2.150  Recent Lipid Panel    Component Value Date/Time   CHOL 212 (H) 12/31/2019 0853   TRIG 125 12/31/2019 0853   HDL 60 12/31/2019 0853   LDLCALC 130 (H) 12/31/2019 0853     Risk Assessment/Calculations:      Physical Exam:    VS:  BP (!) 148/70 (BP Location: Left Arm, Patient Position: Sitting, Cuff Size: Normal)   Pulse 77   Ht 4\' 8"  (1.422 m)   Wt 132 lb 8 oz (60.1 kg)   SpO2 97%   BMI 29.71 kg/m     Wt Readings from Last 3 Encounters:  06/21/20 132 lb 8 oz (60.1 kg)  05/21/20 136 lb 4 oz (61.8 kg)  05/18/20 135 lb 6.4 oz (61.4 kg)     GEN:  Well nourished, well developed in no acute distress HEENT: Normal NECK: No JVD; No carotid bruits LYMPHATICS: No lymphadenopathy CARDIAC: RRR, 2/6 systolic murmur RESPIRATORY:  Clear to auscultation without rales, wheezing or rhonchi  ABDOMEN: Soft, non-tender, non-distended MUSCULOSKELETAL:  trace edema; No deformity  SKIN: Warm and dry NEUROLOGIC:  Alert and oriented x 3 PSYCHIATRIC:  Normal affect   ASSESSMENT:    1. Dyspnea on exertion   2. Primary hypertension   3. Aortic valve stenosis, etiology  of cardiac valve disease unspecified   4. Leg edema   5. Chest pain, unspecified type    PLAN:    In order of problems listed above:  1. Patient with dyspnea on exertion.  She has history of COPD which could be contributing.  Echo showed systolic function was normal, EF 55 to 46%, grade 2 diastolic dysfunction.  Symptoms associated with chest tightness occasionally.  Get Lexiscan Myoview to evaluate presence of ischemia. 2. History of hypertension, BP elevated.  Continue Coreg as prescribed for now.  If BP stays elevated at follow-up visit, consider increasing Coreg.  3. Mild to moderate aortic valve stenosis, likely cause for systolic murmur.  Monitor with serial echocardiograms. 4. Lower extremity edema, grade 2 diastolic dysfunction, Lasix 20 mg daily.   Follow-up Lexiscan Myoview    Shared Decision Making/Informed Consent       Medication Adjustments/Labs and Tests Ordered: Current medicines are reviewed at length with the patient today.  Concerns regarding medicines are outlined above.  Orders Placed This Encounter  Procedures  . NM Myocar Multi W/Spect W/Wall Motion / EF   No orders of the defined types were placed in this encounter.   Patient Instructions  Medication Instructions:  Your physician recommends that you continue on your current medications as directed. Please refer to the Current Medication list given to you today.  *If you need a refill on your cardiac medications before your next appointment, please call your pharmacy*   Lab Work: None Ordered If you have labs (blood work) drawn today and your tests are completely normal, you will receive your results only by: Marland Kitchen MyChart Message (if you have MyChart) OR . A paper copy in the mail If you have any lab test that is abnormal or we need to change your treatment, we will call you to review the results.   Testing/Procedures:  [place]       Your caregiver has ordered a Stress Test with nuclear  imaging. The purpose of this test is to evaluate the blood supply to your heart muscle. This procedure is referred to as a "Non-Invasive Stress Test." This is because other than having an IV started in your vein, nothing is inserted or "invades" your body. Cardiac stress tests are done to find areas of poor blood flow to the heart by determining the extent of coronary artery disease (CAD). Some patients exercise on a treadmill, which naturally increases the blood flow to your heart, while others who are  unable to walk on a treadmill due to physical limitations have a pharmacologic/chemical stress agent called Lexiscan . This medicine will mimic walking on a treadmill by temporarily increasing your coronary blood flow.      PLEASE REPORT TO [facility]   THE VOLUNTEERS AT THE FIRST DESK WILL DIRECT YOU WHERE TO GO     *Please note: these test [place] take anywhere between 2-4 hours to complete       Date of Procedure:_____________________________________   Arrival Time for Procedure:______________________________    PLEASE NOTIFY THE OFFICE AT LEAST 24 HOURS IN ADVANCE IF YOU ARE UNABLE TO KEEP YOUR APPOINTMENT.  [place] 24 HOURS IN ADVANCE IF YOU ARE UNABLE TO KEEP YOUR APPOINTMENT. [phone]         How to prepare for your Myoview test:          _XX___:  Hold betablocker(s) night before procedure and morning of procedure:   carvedilol (COREG)   _XX___:  Hold other medications as follows:   furosemide (LASIX)    1. Do not eat or drink after midnight  2. No caffeine for 24 hours prior to test  3. No smoking 24 hours prior to test.  4. Unless instructed otherwise, Take your medication with a small sips of water.    5.         Ladies, please do not wear dresses. Skirts or pants are appropriate. Please wear a short sleeve shirt.  6. No perfume, cologne or lotion.  7. Wear comfortable walking shoes. No heels!  Follow-Up: At [facility], you and your health needs are our priority.  As part of our continuing mission to provide you with exceptional heart care, we have created designated Provider Care Teams.  These Care Teams include your primary Cardiologist (physician) and Advanced Practice Providers (APPs -  Physician Assistants and Nurse Practitioners) who all work together to provide you with the care you need, when you need it.  We recommend signing up for the patient portal called "MyChart".  Sign up information is provided on this After Visit Summary.  MyChart is used to connect with patients for Virtual Visits (Telemedicine).  Patients are able to view lab/test results, encounter notes, upcoming appointments, etc.  Non-urgent messages can be sent to your provider as well.   To learn more about what you can do with MyChart, go to [email or web address].    Your next appointment:   Follow up after Myoview   The format for your next appointment:   In Person  Provider:   Kate Sable, MD   Other Instructions      Signed, Kate Sable, MD  06/21/2020 12:55 PM    [place]

## 2020-06-21 NOTE — Patient Instructions (Signed)
Medication Instructions:  Your physician recommends that you continue on your current medications as directed. Please refer to the Current Medication list given to you today.  *If you need a refill on your cardiac medications before your next appointment, please call your pharmacy*   Lab Work: None Ordered If you have labs (blood work) drawn today and your tests are completely normal, you will receive your results only by: Marland Kitchen MyChart Message (if you have MyChart) OR . A paper copy in the mail If you have any lab test that is abnormal or we need to change your treatment, we will call you to review the results.   Testing/Procedures:  [place]       Your caregiver has ordered a Stress Test with nuclear imaging. The purpose of this test is to evaluate the blood supply to your heart muscle. This procedure is referred to as a "Non-Invasive Stress Test." This is because other than having an IV started in your vein, nothing is inserted or "invades" your body. Cardiac stress tests are done to find areas of poor blood flow to the heart by determining the extent of coronary artery disease (CAD). Some patients exercise on a treadmill, which naturally increases the blood flow to your heart, while others who are  unable to walk on a treadmill due to physical limitations have a pharmacologic/chemical stress agent called Lexiscan . This medicine will mimic walking on a treadmill by temporarily increasing your coronary blood flow.      PLEASE REPORT TO [facility] ENTRANCE   THE VOLUNTEERS AT THE FIRST DESK WILL DIRECT YOU WHERE TO GO     *Please note: these test [place] take anywhere between 2-4 hours to complete       Date of Procedure:_____________________________________   Arrival Time for Procedure:______________________________    PLEASE NOTIFY THE OFFICE AT LEAST 24 HOURS IN ADVANCE IF YOU ARE UNABLE TO KEEP YOUR APPOINTMENT.  [place] 24 HOURS IN ADVANCE IF YOU ARE UNABLE TO KEEP YOUR APPOINTMENT. [phone]         How to prepare for your Myoview test:          _XX___:  Hold betablocker(s) night before procedure and morning of procedure:   carvedilol (COREG)   _XX___:  Hold other medications as follows:   furosemide (LASIX)    1. Do not eat or drink after midnight  2. No caffeine for 24 hours prior to test  3. No smoking 24 hours prior to test.  4. Unless instructed otherwise, Take your medication with a small sips of water.    5.         Ladies, please do not wear dresses. Skirts or pants are appropriate. Please wear a short sleeve shirt.  6. No perfume, cologne or lotion.  7. Wear comfortable walking shoes. No heels!    Follow-Up: At [facility], you and your health needs are our priority.  As part of our continuing mission to provide you with exceptional heart care, we have created designated Provider Care Teams.  These Care Teams include your primary Cardiologist (physician) and Advanced Practice Providers (APPs -  Physician Assistants and Nurse Practitioners) who all work together to provide you with the care you need, when you need it.  We recommend signing up for the patient portal called "MyChart".  Sign up information is provided on this After Visit Summary.  MyChart is used to connect with patients for  Virtual Visits (Telemedicine).  Patients are able to view lab/test results, encounter notes, upcoming appointments, etc.  Non-urgent messages can be sent to your provider as well.   To learn more about what you can do with MyChart, go to [email or web address].    Your next appointment:   Follow up after Myoview   The format for your next appointment:   In Person  Provider:   Kate Sable, MD   Other Instructions

## 2020-06-24 ENCOUNTER — Other Ambulatory Visit: Payer: Self-pay | Admitting: Adult Health

## 2020-07-08 ENCOUNTER — Telehealth: Payer: Self-pay | Admitting: Medical

## 2020-07-08 ENCOUNTER — Telehealth: Payer: Self-pay

## 2020-07-08 ENCOUNTER — Other Ambulatory Visit: Payer: Self-pay

## 2020-07-08 ENCOUNTER — Other Ambulatory Visit: Payer: Self-pay | Admitting: Medical

## 2020-07-08 ENCOUNTER — Encounter
Admission: RE | Admit: 2020-07-08 | Discharge: 2020-07-08 | Disposition: A | Discharge status: Home / Self Care | Payer: PPO | Source: Ambulatory Visit | Attending: Cardiology | Admitting: Cardiology

## 2020-07-08 DIAGNOSIS — R079 Chest pain, unspecified: Secondary | ICD-10-CM | POA: Diagnosis not present

## 2020-07-08 DIAGNOSIS — I480 Paroxysmal atrial fibrillation: Secondary | ICD-10-CM

## 2020-07-08 MED ORDER — APIXABAN 5 MG PO TABS: 5.0000 mg | ORAL_TABLET | Freq: Two times a day (BID) | ORAL | 5 refills | Status: DC

## 2020-07-08 MED ORDER — TECHNETIUM TC 99M TETROFOSMIN IV KIT
10.0000 | PACK | Freq: Once | INTRAVENOUS | Status: AC | PRN
  Administered 2020-07-08: 10.653 via INTRAVENOUS

## 2020-07-08 MED ORDER — TECHNETIUM TC 99M TETROFOSMIN IV KIT
30.0000 | PACK | Freq: Once | INTRAVENOUS | Status: AC | PRN
  Administered 2020-07-08: 30.605 via INTRAVENOUS

## 2020-07-08 MED ORDER — REGADENOSON 0.4 MG/5ML IV SOLN
0.4000 mg | Freq: Once | INTRAVENOUS | Status: AC
  Administered 2020-07-08: 0.4 mg via INTRAVENOUS

## 2020-07-08 NOTE — Progress Notes (Unsigned)
Cardiology Office Note    Date:  07/09/2020   ID:  Brenda Tucker, DOB 1934-03-22, MRN [id number]  PCP:  Ronnell Freshwater, NP  Cardiologist:  Kate Sable, MD  Electrophysiologist:  None   Chief Complaint: Follow up [place] MPI  History of Present Illness:   Brenda Tucker is a 87 y.o. female with history of newly diagnosed A. fib on 07/08/2020 in the setting of Lexiscan administration, diastolic dysfunction, mild to moderate aortic valve stenosis, CKD stage IIIb, carotid artery stenosis with 50 to 69% bilateral ICA stenoses by ultrasound in 01/2020 followed by PCP, HTN, HLD, and COPD without prior tobacco use who presents for follow-up of recently diagnosed A. Fib.  Remote treadmill Myoview in 2011 showed no evidence of ischemia or arrhythmia with average exercise tolerance for age with an EF of 70%.  Prior echo in 01/2018, performed at outside office, showed normal LV systolic function, normal LV cavity size, normal LV wall motion, normal LV diastolic function, normal RV systolic function and ventricular cavity size, borderline dilatation of the left atrium, mild mitral regurgitation, moderate aortic annular calcification, and borderline aortic stenosis.  She was evaluated by Dr. Garen Lah at the request of Dr. Kenton Kingfisher for RBBB, dyspnea, and lower extremity swelling on 05/21/2020.  Amlodipine was held given her lower extremity swelling.  In this setting she underwent echo on 06/16/2020 which showed an EF of 55 to 60%, no regional wall motion abnormalities, grade 2 diastolic dysfunction, normal RV systolic function and ventricular cavity size, trivial mitral regurgitation, calcified aortic valve with mild to moderate aortic valve stenosis with a mean gradient of 18 mmHg, and an estimated right atrial pressure of 8 mmHg.  In this setting, she was added on Lasix 20 mg daily.  She was last seen in the office on 06/21/2020 and continued to note exertional shortness of breath sometimes associated with chest  tightness.  Given this she was scheduled for a Lexiscan MPI.  She presented for Lexiscan MPI on 07/08/2020.  During the administration of Lexiscan, during the stress portion of her study, she developed new onset A. fib with RVR with ventricular rates in the 130s bpm.  She spontaneously converted back to sinus rhythm approximately 26 minutes into recovery.  In this setting appointment was scheduled for today.  Her primary cardiologist has reached out and recommends deferring anticoagulation at this time given A. fib was noted with the administration of Lexiscan.  He recommends placing an outpatient cardiac monitor to evaluate for A. fib burden and if she is found to have A. fib outside of [place] administration then anticoagulation can be considered.  CHA2DS2-VASc of at least 5 (HTN, age x2, vascular disease, sex category).    She comes in today accompanied by her daughter.  She continues to note stable exertional dyspnea that has been present "for as long as I can remember."  She denies any chest pain, palpitations, dizziness, presyncope, or syncope.  No lower extremity swelling.  She has never been told she has a history of A. fib.  She did have some lingering nausea, vomiting, and diarrhea into last week following her stress test.  These have resolved this morning.  Over the past 12 months she has had 1 mechanical fall.  She denies any history of hematochezia or melena.  When asking her if she could feel palpitations when she was in documented A. fib with RVR yesterday she did not feel frank palpitations though she did report chest tightness and increased dyspnea.  Currently, she is without complaints.   Labs independently reviewed: 12/2019 - TSH normal, free T4 normal, TC 212, TG 125, HDL 60, LDL 130, Hgb 11.9, PLT 265, BUN 24, serum creatinine 1.39, potassium 4.6, albumin 3.8, AST/ALT normal  Past Medical History:  Diagnosis Date  . Arthritis   . Asthma   . Cancer (HCC)    skin, colon polyp  .  Chronic kidney disease   . Complication of anesthesia    asthma attack per pt  . COPD (chronic obstructive pulmonary disease) ([place])   . Dyspnea   . Hypertension   . Hypothyroidism     Past Surgical History:  Procedure Laterality Date  . ABDOMINAL HYSTERECTOMY    . APPENDECTOMY    . CHOLECYSTECTOMY N/A 03/12/2020   Procedure: LAPAROSCOPIC CHOLECYSTECTOMY;  Surgeon: Fredirick Maudlin, MD;  Location: [facility] ORS;  Service: General;  Laterality: N/A;  . COLONOSCOPY WITH PROPOFOL N/A 07/09/2015   Procedure: COLONOSCOPY WITH PROPOFOL;  Surgeon: Manya Silvas, MD;  Location: [facility] ENDOSCOPY;  Service: Endoscopy;  Laterality: N/A;    Current Medications: Current Meds  Medication Sig  . acetaminophen (TYLENOL) 500 MG tablet Take 500 mg by mouth every 6 (six) hours as needed for moderate pain or headache.  . Albuterol Sulfate (PROAIR RESPICLICK) 944 (90 Base) MCG/ACT AEPB Inhale 2 puffs into the lungs every 4 (four) hours as needed.  Marland Kitchen apixaban (ELIQUIS) 5 MG TABS tablet Take 1 tablet (5 mg total) by mouth 2 (two) times daily.  Marland Kitchen aspirin EC 81 MG tablet Take 81 mg by mouth daily.  Marland Kitchen atorvastatin (LIPITOR) 40 MG tablet Take 1 tablet (40 mg total) by mouth daily.  . calcium carbonate (OSCAL) 1500 (600 Ca) MG TABS tablet Take 600 mg of elemental calcium by mouth daily.  . carvedilol (COREG) 25 MG tablet Take 1 tablet (25 mg total) by mouth 2 (two) times daily.  . Cholecalciferol (VITAMIN D) 50 MCG (2000 UT) tablet Take 2,000 Units by mouth daily.  . Fluticasone-Salmeterol (ADVAIR) 250-50 MCG/DOSE AEPB Inhale 1 puff into the lungs 2 (two) times daily.  . furosemide (LASIX) 20 MG tablet Take 1 tablet (20 mg total) by mouth daily.  Marland Kitchen latanoprost (XALATAN) 0.005 % ophthalmic solution Place 1 drop into both eyes at bedtime.  Marland Kitchen levothyroxine (SYNTHROID) 50 MCG tablet Take 1 tablet (50 mcg total) by mouth daily before breakfast.  . losartan (COZAAR) 100 MG tablet Take 100 mg by mouth every evening.   .  Multiple Vitamins-Minerals (OCUVITE PO) Take 1 tablet by mouth daily.  . traMADol (ULTRAM) 50 MG tablet Take 1 tablet (50 mg total) by mouth every 6 (six) hours as needed.  . vitamin B-12 (CYANOCOBALAMIN) 1000 MCG tablet Take 1,000 mcg by mouth daily.  . [DISCONTINUED] carvedilol (COREG) 12.5 MG tablet Take 1 tablet (12.5 mg total) by mouth 2 (two) times daily with a meal.    Allergies:   Theodrenaline   Social History   Socioeconomic History  . Marital status: Widowed    Spouse name: Not on file  . Number of children: Not on file  . Years of education: Not on file  . Highest education level: Not on file  Occupational History  . Not on file  Tobacco Use  . Smoking status: Never Smoker  . Smokeless tobacco: Never Used  . Tobacco comment: second hand smoke  Vaping Use  . Vaping Use: Never used  Substance and Sexual Activity  . Alcohol use: No  . Drug use: No  .  Sexual activity: Not on file  Other Topics Concern  . Not on file  Social History Narrative  . Not on file   Social Determinants of Health   Financial Resource Strain: Not on file  Food Insecurity: Not on file  Transportation Needs: Not on file  Physical Activity: Not on file  Stress: Not on file  Social Connections: Not on file     Family History:  The patient's family history includes Cancer in her brother, sister, and sister; Early death in her sister and sister; Heart attack in her father; Lung cancer in her brother; Stomach cancer in her brother. There is no history of Breast cancer.  ROS:   Review of Systems  Constitutional: Positive for malaise/fatigue. Negative for chills, diaphoresis, fever and weight loss.  HENT: Negative for congestion.   Eyes: Negative for discharge and redness.  Respiratory: Positive for shortness of breath. Negative for cough, hemoptysis, sputum production and wheezing.   Cardiovascular: Negative for chest pain, palpitations, orthopnea, claudication, leg swelling and PND.   Gastrointestinal: Negative for abdominal pain, blood in stool, heartburn, melena, nausea and vomiting.  Genitourinary: Negative for hematuria.  Musculoskeletal: Positive for falls. Negative for myalgias.  Skin: Negative for rash.  Neurological: Positive for weakness. Negative for dizziness, tingling, tremors, sensory change, speech change, focal weakness and loss of consciousness.  Endo/Heme/Allergies: Does not bruise/bleed easily.  Psychiatric/Behavioral: Negative for substance abuse. The patient is not nervous/anxious.   All other systems reviewed and are negative.    EKGs/Labs/Other Studies Reviewed:    Studies reviewed were summarized above. The additional studies were reviewed today:  2D echo 06/16/2020: 1. Left ventricular ejection fraction, by estimation, is 55 to 60%. The  left ventricle has normal function. The left ventricle has no regional  wall motion abnormalities. Left ventricular diastolic parameters are  consistent with Grade II diastolic  dysfunction (pseudonormalization).  2. Right ventricular systolic function is normal. The right ventricular  size is normal.  3. The mitral valve is normal in structure. Trivial mitral valve  regurgitation. No evidence of mitral stenosis.  4. Aortic valve DVI = 0.33, mean Gradient 18, PG 29.. The aortic valve is  calcified. Aortic valve regurgitation is not visualized. Mild to moderate  aortic valve stenosis.  5. The inferior vena cava is normal in size with <50% respiratory  variability, suggesting right atrial pressure of 8 mmHg. __________  [facility] MPI 07/08/2020: Pending  EKG:  EKG is ordered today.  The EKG ordered today demonstrates NSR, 87 bpm, frequent PACs, bifascicular block (known)  Recent Labs: 12/31/2019: ALT 8; BUN 24; Creatinine, Ser 1.39; Hemoglobin 11.9; Platelets 265; Potassium 4.6; Sodium 138; TSH 2.150  Recent Lipid Panel    Component Value Date/Time   CHOL 212 (H) 12/31/2019 0853   TRIG 125  12/31/2019 0853   HDL 60 12/31/2019 0853   LDLCALC 130 (H) 12/31/2019 0853    PHYSICAL EXAM:    VS:  BP 140/80 (BP Location: Left Arm, Patient Position: Sitting, Cuff Size: Normal)   Pulse 87   Ht 4\' 8"  (1.422 m)   Wt 129 lb (58.5 kg)   SpO2 96%   BMI 28.92 kg/m   BMI: Body mass index is 28.92 kg/m.  Physical Exam Vitals reviewed.  Constitutional:      Appearance: She is well-developed and well-nourished.  HENT:     Head: Normocephalic and atraumatic.  Eyes:     General:        Right eye: No discharge.  Left eye: No discharge.  Neck:     Vascular: No JVD.  Cardiovascular:     Rate and Rhythm: Normal rate and regular rhythm. Occasional extrasystoles are present.    Pulses: No midsystolic click and no opening snap.          Posterior tibial pulses are 2+ on the right side and 2+ on the left side.     Heart sounds: S1 normal and S2 normal. Heart sounds not distant. Murmur heard.   Harsh midsystolic murmur is present with a grade of 2/6 at the upper right sternal border radiating to the neck. No friction rub.  Pulmonary:     Effort: Pulmonary effort is normal. No respiratory distress.     Breath sounds: Normal breath sounds. No decreased breath sounds, wheezing or rales.  Chest:     Chest wall: No tenderness.  Abdominal:     General: There is no distension.     Palpations: Abdomen is soft.     Tenderness: There is no abdominal tenderness.  Musculoskeletal:        General: No edema.     Cervical back: Normal range of motion.  Skin:    General: Skin is warm and dry.     Nails: There is no clubbing or cyanosis.  Neurological:     Mental Status: She is alert and oriented to person, place, and time.  Psychiatric:        Mood and Affect: Mood and affect normal.        Speech: Speech normal.        Behavior: Behavior normal.        Thought Content: Thought content normal.        Judgment: Judgment normal.     Wt Readings from Last 3 Encounters:  07/09/20 129  lb (58.5 kg)  06/21/20 132 lb 8 oz (60.1 kg)  05/21/20 136 lb 4 oz (61.8 kg)     ASSESSMENT & PLAN:   1. New onset A. Fib: Maintaining sinus rhythm with PACs in the office today.  She developed A. fib with RVR following Lexiscan administration.  There is a less than 1% occurrence of A. fib with Lexiscan administration.  After discussion with the patient's primary cardiologist we will defer initiation of [place] given her A. fib occurred in the context of [place] administration.  Place Zio patch.  If she is found to have further episodes of A. fib on outpatient cardiac monitoring, given her CHA2DS2-VASc of 5, we will then need to pursue [place] and discontinue ASA.  Check BMP, magnesium, TSH, and CBC.  Recent echo as outlined above.  Await Lexiscan MPI over read.  Titrate carvedilol to 25 mg twice daily as outlined below.  2. Diastolic dysfunction: She appears euvolemic and well compensated.  Her weight is down 3 pounds today when compared to her last clinic visit.  Optimal blood pressure control recommended with titration of carvedilol as outlined below.  She remains on low-dose furosemide.  CHF education.  3. Exertional dyspnea: Likely multifactorial including moderate to severe COPD, mild to moderate aortic stenosis, and diastolic dysfunction.  Cannot exclude A. fib as a contributing cause as well given this is a new diagnosis however, her burden remains uncertain.  Await Lexiscan MPI read.  4. Aortic stenosis: Mild to moderate aortic valve stenosis noted on echo 06/16/2020.  Follow-up with periodic echo.  Contributing to her dyspnea.  5. CKD stage IIIb: Stable on most recent check.  Check BMP today as outlined  above.  Follow-up with PCP as directed.  6. HTN: Blood pressure is mildly elevated in the office today at 140/80.  Titrate carvedilol to 25 mg twice daily.  If BP remains elevated following this recommend transitioning losartan to irbesartan.  She remains on low-dose Lasix as outlined above.   Low-sodium diet recommended.  7. HLD: LDL 130 from 12/2019 with goal being < 70.  Not currently on a statin.  Start atorvastatin 40 mg daily.  Follow-up fasting lipid panel and LFT in 2 months.  Titrate statin accordingly at that time.  8. Carotid artery stenosis: Noted to have 50 to 69% bilateral ICA stenosis by ultrasound in 01/2020.  Scheduled for repeat ultrasound through her PCP on 07/14/2020.  Continue aspirin.  Start statin as outlined above.  9. COPD: Stable without acute exacerbation.  PFTs in 01/2020 consistent with moderate to severe obstructive lung disease without response to bronchodilator.  Contributing to her overall presentation of dyspnea.  Follow-up with pulmonology as directed.  Disposition: F/u with Dr. Garen Lah or an APP in 1 month.   Medication Adjustments/Labs and Tests Ordered: Current medicines are reviewed at length with the patient today.  Concerns regarding medicines are outlined above. Medication changes, Labs and Tests ordered today are summarized above and listed in the Patient Instructions accessible in Encounters.   Signed, Christell Faith, PA-C 07/09/2020 2:51 PM     [place] [address] [place] [address] [place], [place] [address] [phone]

## 2020-07-08 NOTE — Telephone Encounter (Signed)
Patient had an outpatient Myoview lexiscan this morning for worsening sob. During stress portion of the test the patient experienced sob, chest tightness, nausea, and vomiting. Heart rates noted to be elevated and test was continued. She also had persistent chest tightness and she was administered SL Nitro x 1 with improvement. Heart rates continued to be elevated and it was noted she was in Afib RVR with rates in the 130s (this is new for the patient). Dr. Rockey Situ was notified who eventually went down and spoke to the patient. After about 10-15 minutes the patient self-converted to SR. She was able to complete second set of pictures. CHADSVASC at least 5 (agex2, PAD, HTN, CHF) and Eliquis 5mg  BID was sent in. CMP, Mag, TSH, and CBC ordered. She is on coreg 12.5mg  BID at home. Patient has follow-up tomorrow in clinic for further discussion.   Blakely Gluth Kathlen Mody, PA-C

## 2020-07-08 NOTE — Telephone Encounter (Signed)
Called patient and schedule her to see Christell Faith per request from [place] as seen in staff message copied and pasted below. Patient will pick up Eliquis samples at time of visit as she wants to 'try it first and does not want ot pay for them until she knows she is okay taking them'. Prescription has been sent to pharmacy.  Patient verbalized understanding and agreed with plan.  Furth, Cadence H, PA-C  P Cv Div [place] Triage  Patient had a stress test today and went into Afib RVR. This is new. She self-converted back to SR and stress test was completed. Dr. Rockey Situ was notified. Her daughter brought her home.   I ordered labs. Patient also needs Eliquis 5mg  BID sent to her pharmacy. Patient needs follow-up in the office, the soonest possible.   Please call and arrange close follow-up, labs, and send in Eliquis to appropriate pharmacy. If patient is able to get in to be seen tomorrow labs can be done tomorrow.   Sorry if this is confusing.   Dr. Garen Lah is her cardiolgist and I let him know.

## 2020-07-08 NOTE — Telephone Encounter (Signed)
-----   Message from [place], PA-C sent at 07/08/2020 12:35 PM EST ----- Regarding: new onset afib, Ignore the last message I sent-wrong patient Please ignore the message regarding [name], that was the wrong patient!  Brenda Tucker is the right patient.  Patient had a stress test today and went into Afib RVR. This is new. She self-converted back to SR and stress test was completed. Dr. Rockey Situ was notified. Her daughter brought her home.   I ordered labs. Patient also needs Eliquis 5mg  BID sent to her pharmacy. Patient needs follow-up in the office, the soonest possible.   Please call and arrange close follow-up, labs, and send in Eliquis to appropriate pharmacy. If patient is able to get in to be seen tomorrow labs can be done tomorrow.   Sorry if this is confusing.   Dr. Garen Lah is her cardiolgist and I let him know.   Thanks!

## 2020-07-08 NOTE — Telephone Encounter (Signed)
-----   Message from [place], PA-C sent at 07/08/2020 12:35 PM EST ----- Regarding: new onset afib, Ignore the last message I sent-wrong patient Please ignore the message regarding [name], that was the wrong patient!  Brenda Tucker is the right patient.  Patient had a stress test today and went into Afib RVR. This is new. She self-converted back to SR and stress test was completed. Dr. Rockey Situ was notified. Her daughter brought her home.   I ordered labs. Patient also needs Eliquis 5mg  BID sent to her pharmacy. Patient needs follow-up in the office, the soonest possible.   Please call and arrange close follow-up, labs, and send in Eliquis to appropriate pharmacy. If patient is able to get in to be seen tomorrow labs can be done tomorrow.   Sorry if this is confusing.   Dr. Garen Lah is her cardiolgist and I let him know.   Thanks!

## 2020-07-09 ENCOUNTER — Ambulatory Visit (INDEPENDENT_AMBULATORY_CARE_PROVIDER_SITE_OTHER): Payer: PPO

## 2020-07-09 ENCOUNTER — Encounter: Payer: Self-pay | Admitting: Physician Assistant

## 2020-07-09 ENCOUNTER — Ambulatory Visit: Payer: PPO | Admitting: Physician Assistant

## 2020-07-09 VITALS — BP 140/80 | HR 87 | Ht <= 58 in | Wt 129.0 lb

## 2020-07-09 DIAGNOSIS — R0600 Dyspnea, unspecified: Secondary | ICD-10-CM | POA: Diagnosis not present

## 2020-07-09 DIAGNOSIS — I10 Essential (primary) hypertension: Secondary | ICD-10-CM

## 2020-07-09 DIAGNOSIS — I5189 Other ill-defined heart diseases: Secondary | ICD-10-CM | POA: Diagnosis not present

## 2020-07-09 DIAGNOSIS — J449 Chronic obstructive pulmonary disease, unspecified: Secondary | ICD-10-CM

## 2020-07-09 DIAGNOSIS — I6523 Occlusion and stenosis of bilateral carotid arteries: Secondary | ICD-10-CM

## 2020-07-09 DIAGNOSIS — I4891 Unspecified atrial fibrillation: Secondary | ICD-10-CM

## 2020-07-09 DIAGNOSIS — E785 Hyperlipidemia, unspecified: Secondary | ICD-10-CM

## 2020-07-09 DIAGNOSIS — R0609 Other forms of dyspnea: Secondary | ICD-10-CM

## 2020-07-09 DIAGNOSIS — N1832 Chronic kidney disease, stage 3b: Secondary | ICD-10-CM | POA: Diagnosis not present

## 2020-07-09 DIAGNOSIS — I350 Nonrheumatic aortic (valve) stenosis: Secondary | ICD-10-CM | POA: Diagnosis not present

## 2020-07-09 LAB — NM MYOCAR MULTI W/SPECT W/WALL MOTION / EF
LV dias vol: 29 mL (ref 46–106)
LV sys vol: 10 mL
MPHR: 136 {beats}/min
Peak HR: 157 {beats}/min
Percent HR: 115 %
Rest HR: 70 {beats}/min
SDS: 1
SRS: 1
SSS: 5
TID: 0.76

## 2020-07-09 MED ORDER — ATORVASTATIN CALCIUM 40 MG PO TABS: 40.0000 mg | ORAL_TABLET | Freq: Every day | ORAL | 3 refills | Status: DC

## 2020-07-09 MED ORDER — CARVEDILOL 25 MG PO TABS: 25.0000 mg | ORAL_TABLET | Freq: Two times a day (BID) | ORAL | 3 refills | Status: DC

## 2020-07-09 NOTE — Patient Instructions (Addendum)
Medication Instructions:  Your physician has recommended you make the following change in your medication:  1. STOP Eliquis 2. INCREASE Carvediolol to 25 mg twice a day 3. START atorvastatin 40 mg once daily   *If you need a refill on your cardiac medications before your next appointment, please call your pharmacy*   Lab Work: CBC, BMET, TSH, Magnesium levels done today.   In 2 months (Around March 7th) go to [facility] Entr[facility] and check in at registration. No appointment is needed for this. We will have a Lipid & Liver panel done. Make sure not to eat or drink anything after midnight before except sip of water with pills. Take lab slips with you to help them know which ones to do.   If you have labs (blood work) drawn today and your tests are completely normal, you will receive your results only by: Marland Kitchen MyChart Message (if you have MyChart) OR . A paper copy in the mail If you have any lab test that is abnormal or we need to change your treatment, we will call you to review the results.   Testing/Procedures: Your physician has recommended that you wear a Zio monitor. This monitor is a medical device that records the heart's electrical activity. Doctors most often use these monitors to diagnose arrhythmias. Arrhythmias are problems with the speed or rhythm of the heartbeat. The monitor is a small device applied to your chest. You can wear one while you do your normal daily activities. While wearing this monitor if you have any symptoms to push the button and record what you felt. Once you have worn this monitor for the period of time provider prescribed (Usually 14 days), you will return the monitor device in the postage paid box. Once it is returned they will download the data collected and provide [place] with a report which the provider will then review and we will call you with those results. Important tips:  1. Avoid showering during the first 24 hours of wearing the monitor. 2. Avoid  excessive sweating to help maximize wear time. 3. Do not submerge the device, no hot tubs, and no swimming pools. 4. Keep any lotions or oils away from the patch. 5. After 24 hours you [place] shower with the patch on. Take brief showers with your back facing the shower head.  6. Do not remove patch once it has been placed because that will interrupt data and decrease adhesive wear time. 7. Push the button when you have any symptoms and write down what you were feeling. 8. Once you have completed wearing your monitor, remove and place into box which has postage paid and place in your outgoing mailbox.  9. If for some reason you have misplaced your box then call our office and we can provide another box and/or mail it off for you.         Follow-Up: At [facility], you and your health needs are our priority.  As part of our continuing mission to provide you with exceptional heart care, we have created designated Provider Care Teams.  These Care Teams include your primary Cardiologist (physician) and Advanced Practice Providers (APPs -  Physician Assistants and Nurse Practitioners) who all work together to provide you with the care you need, when you need it.  We recommend signing up for the patient portal called "MyChart".  Sign up information is provided on this After Visit Summary.  MyChart is used to connect with patients for Virtual Visits (Telemedicine).  Patients are able to view lab/test results, encounter notes, upcoming appointments, etc.  Non-urgent messages can be sent to your provider as well.   To learn more about what you can do with MyChart, go to [email or web address].    Your next appointment:   1 month(s)  The format for your next appointment:   In Person  Provider:   You Bonser see Kate Sable, MD or one of the following Advanced Practice Providers on your designated Care Team:    Murray Hodgkins, NP  Christell Faith, PA-C  Marrianne Mood, PA-C  Cadence [place],  [place]  Laurann Montana, NP

## 2020-07-10 LAB — MAGNESIUM: Magnesium: 1.4 mg/dL — ABNORMAL LOW (ref 1.6–2.3)

## 2020-07-10 LAB — BASIC METABOLIC PANEL
BUN/Creatinine Ratio: 31 — ABNORMAL HIGH (ref 12–28)
BUN: 67 mg/dL — ABNORMAL HIGH (ref 8–27)
CO2: 22 mmol/L (ref 20–29)
Calcium: 10 mg/dL (ref 8.7–10.3)
Chloride: 100 mmol/L (ref 96–106)
Creatinine, Ser: 2.18 mg/dL — ABNORMAL HIGH (ref 0.57–1.00)
GFR calc Af Amer: 23 mL/min/{1.73_m2} — ABNORMAL LOW (ref >59)
GFR calc non Af Amer: 20 mL/min/{1.73_m2} — ABNORMAL LOW (ref >59)
Glucose: 159 mg/dL — ABNORMAL HIGH (ref 65–99)
Potassium: 4.2 mmol/L (ref 3.5–5.2)
Sodium: 139 mmol/L (ref 134–144)

## 2020-07-10 LAB — CBC
Hematocrit: 37.4 % (ref 34.0–46.6)
Hemoglobin: 12.5 g/dL (ref 11.1–15.9)
MCH: 32.5 pg (ref 26.6–33.0)
MCHC: 33.4 g/dL (ref 31.5–35.7)
MCV: 97 fL (ref 79–97)
Platelets: 280 10*3/uL (ref 150–450)
RBC: 3.85 x10E6/uL (ref 3.77–5.28)
RDW: 11.9 % (ref 11.7–15.4)
WBC: 7.4 10*3/uL (ref 3.4–10.8)

## 2020-07-10 LAB — TSH: TSH: 0.976 u[IU]/mL (ref 0.450–4.500)

## 2020-07-12 ENCOUNTER — Telehealth: Payer: Self-pay | Admitting: *Deleted

## 2020-07-12 DIAGNOSIS — H40023 Open angle with borderline findings, high risk, bilateral: Secondary | ICD-10-CM | POA: Diagnosis not present

## 2020-07-12 DIAGNOSIS — H353132 Nonexudative age-related macular degeneration, bilateral, intermediate dry stage: Secondary | ICD-10-CM | POA: Diagnosis not present

## 2020-07-12 DIAGNOSIS — Z79899 Other long term (current) drug therapy: Secondary | ICD-10-CM

## 2020-07-12 DIAGNOSIS — I10 Essential (primary) hypertension: Secondary | ICD-10-CM

## 2020-07-12 MED ORDER — FUROSEMIDE 20 MG PO TABS: 20.0000 mg | ORAL_TABLET | Freq: Every day | ORAL | 1 refills | Status: DC | PRN

## 2020-07-12 MED ORDER — MAGNESIUM OXIDE 400 MG PO CAPS: 400.0000 mg | ORAL_CAPSULE | Freq: Every day | ORAL | 4 refills | Status: DC

## 2020-07-12 NOTE — Telephone Encounter (Signed)
-----   Message from Rise Mu, PA-C sent at 07/10/2020  9:53 AM EST ----- Blood count normal Renal function is worse and above her baseline, indicating she is dehydrated  Thyroid function normal Magnesium is mildly low  I left a voicemail with the below recommendations regarding her Lasix, losartan, and follow up BMET.  Please call her Monday, 07/12/20, to ensure she followed those recommendations and to go over the remaining recommendations   Recommendations: -Hold Lasix, take only as needed for weight gain > 3 pounds overnight or > 5 pounds in a week -Hold losartan until recheck, if renal function is improved at that time, we will resume this -Needs recheck BMET 1 week -Start magnesium oxide 400 mg daily  -Random glucose elevated, needs to follow up with PCP for further evaluation

## 2020-07-12 NOTE — Telephone Encounter (Signed)
The patient has been notified of the result and verbalized understanding.  All questions (if any) were answered. She wrote down the instructions and read them back to me twice: Recommendations: -Hold Lasix, take only as needed for weight gain > 3 pounds overnight or > 5 pounds in a week -Hold losartan until recheck, if renal function is improved at that time, we will resume this -Needs recheck BMET 1 week -Start magnesium oxide 400 mg daily  -Random glucose elevated, needs to follow up with PCP for further evaluation   She is aware to go to the [place] on Friday for repeat lab work.

## 2020-07-14 ENCOUNTER — Other Ambulatory Visit: Payer: Self-pay

## 2020-07-14 ENCOUNTER — Ambulatory Visit: Payer: PPO

## 2020-07-14 DIAGNOSIS — I6523 Occlusion and stenosis of bilateral carotid arteries: Secondary | ICD-10-CM | POA: Diagnosis not present

## 2020-07-15 ENCOUNTER — Encounter: Payer: Self-pay | Admitting: Internal Medicine

## 2020-07-15 ENCOUNTER — Ambulatory Visit: Payer: PPO | Admitting: Internal Medicine

## 2020-07-15 ENCOUNTER — Telehealth: Payer: Self-pay

## 2020-07-15 VITALS — BP 140/70 | HR 61 | Temp 98.0°F | Resp 16 | Ht <= 58 in | Wt 131.0 lb

## 2020-07-15 DIAGNOSIS — G4719 Other hypersomnia: Secondary | ICD-10-CM | POA: Diagnosis not present

## 2020-07-15 DIAGNOSIS — K219 Gastro-esophageal reflux disease without esophagitis: Secondary | ICD-10-CM | POA: Diagnosis not present

## 2020-07-15 DIAGNOSIS — J449 Chronic obstructive pulmonary disease, unspecified: Secondary | ICD-10-CM

## 2020-07-15 DIAGNOSIS — R0602 Shortness of breath: Secondary | ICD-10-CM | POA: Diagnosis not present

## 2020-07-15 NOTE — Patient Instructions (Addendum)

## 2020-07-15 NOTE — Progress Notes (Signed)
[facility] [place], [place] [address]  Pulmonary Sleep Medicine   Office Visit Note  Patient Name: Toshia Larkin Rhines DOB: 04/02/1934 MRN [id number]  Date of Service: 07/15/2020  Complaints/HPI: Atrial fibrillation. She has been worked up for A fib including stress test and echocardiogram.  She does have some issues with snoring and feels tired frequently.  She is not had a sleep evaluation.  In addition she does have history of hypertension.  During the daytime she does feel fatigued.  Because of the findings of excessive fatigue tiredness and atrial fibrillation would be a good idea to pursue this further with the sleep study to make certain that there is no underlying sleep disordered breathing.  ROS  General: (-) fever, (-) chills, (-) night sweats, (-) weakness Skin: (-) rashes, (-) itching,. Eyes: (-) visual changes, (-) redness, (-) itching. Nose and Sinuses: (-) nasal stuffiness or itchiness, (-) postnasal drip, (-) nosebleeds, (-) sinus trouble. Mouth and Throat: (-) sore throat, (-) hoarseness. Neck: (-) swollen glands, (-) enlarged thyroid, (-) neck pain. Respiratory: - cough, (-) bloody sputum, +shortness of breath, - wheezing. Cardiovascular: - ankle swelling, (-) chest pain. Lymphatic: (-) lymph node enlargement. Neurologic: (-) numbness, (-) tingling. Psychiatric: (-) anxiety, (-) depression   Current Medication: Outpatient Encounter Medications as of 07/15/2020  Medication Sig  . acetaminophen (TYLENOL) 500 MG tablet Take 500 mg by mouth every 6 (six) hours as needed for moderate pain or headache.  . Albuterol Sulfate (PROAIR RESPICLICK) 528 (90 Base) MCG/ACT AEPB Inhale 2 puffs into the lungs every 4 (four) hours as needed.  Marland Kitchen aspirin EC 81 MG tablet Take 81 mg by mouth daily.  Marland Kitchen atorvastatin (LIPITOR) 40 MG tablet Take 1 tablet (40 mg total) by mouth daily.  . calcium carbonate (OSCAL) 1500 (600 Ca) MG TABS tablet Take 600 mg of elemental  calcium by mouth daily.  . carvedilol (COREG) 25 MG tablet Take 1 tablet (25 mg total) by mouth 2 (two) times daily.  . Cholecalciferol (VITAMIN D) 50 MCG (2000 UT) tablet Take 2,000 Units by mouth daily.  . Fluticasone-Salmeterol (ADVAIR) 250-50 MCG/DOSE AEPB Inhale 1 puff into the lungs 2 (two) times daily.  . furosemide (LASIX) 20 MG tablet Take 1 tablet (20 mg total) by mouth daily as needed (for weight gain greater than 3 pounds overnight and 5 pounds in 1 week.).  Marland Kitchen latanoprost (XALATAN) 0.005 % ophthalmic solution Place 1 drop into both eyes at bedtime.  Marland Kitchen levothyroxine (SYNTHROID) 50 MCG tablet Take 1 tablet (50 mcg total) by mouth daily before breakfast.  . losartan (COZAAR) 100 MG tablet Take 100 mg by mouth every evening.   . Magnesium Oxide 400 MG CAPS Take 1 capsule (400 mg total) by mouth daily.  . Multiple Vitamins-Minerals (OCUVITE PO) Take 1 tablet by mouth daily.  . traMADol (ULTRAM) 50 MG tablet Take 1 tablet (50 mg total) by mouth every 6 (six) hours as needed.  . vitamin B-12 (CYANOCOBALAMIN) 1000 MCG tablet Take 1,000 mcg by mouth daily.   No facility-administered encounter medications on file as of 07/15/2020.    Surgical History: Past Surgical History:  Procedure Laterality Date  . ABDOMINAL HYSTERECTOMY    . APPENDECTOMY    . CHOLECYSTECTOMY N/A 03/12/2020   Procedure: LAPAROSCOPIC CHOLECYSTECTOMY;  Surgeon: Fredirick Maudlin, MD;  Location: [facility] ORS;  Service: General;  Laterality: N/A;  . COLONOSCOPY WITH PROPOFOL N/A 07/09/2015   Procedure: COLONOSCOPY WITH PROPOFOL;  Surgeon: Manya Silvas, MD;  Location: [facility] ENDOSCOPY;  Service: Endoscopy;  Laterality: N/A;    Medical History: Past Medical History:  Diagnosis Date  . Arthritis   . Asthma   . Cancer (HCC)    skin, colon polyp  . Chronic kidney disease   . Complication of anesthesia    asthma attack per pt  . COPD (chronic obstructive pulmonary disease) ([place])   . Dyspnea   . Hypertension   .  Hypothyroidism     Family History: Family History  Problem Relation Age of Onset  . Heart attack Father   . Early death Sister        MVA  . Stomach cancer Brother   . Cancer Brother        shoulder  . Lung cancer Brother   . Cancer Sister   . Cancer Sister   . Early death Sister        husband killed her  . Breast cancer Neg Hx     Social History: Social History   Socioeconomic History  . Marital status: Widowed    Spouse name: Not on file  . Number of children: Not on file  . Years of education: Not on file  . Highest education level: Not on file  Occupational History  . Not on file  Tobacco Use  . Smoking status: Never Smoker  . Smokeless tobacco: Never Used  . Tobacco comment: second hand smoke  Vaping Use  . Vaping Use: Never used  Substance and Sexual Activity  . Alcohol use: No  . Drug use: No  . Sexual activity: Not on file  Other Topics Concern  . Not on file  Social History Narrative  . Not on file   Social Determinants of Health   Financial Resource Strain: Not on file  Food Insecurity: Not on file  Transportation Needs: Not on file  Physical Activity: Not on file  Stress: Not on file  Social Connections: Not on file  Intimate Partner Violence: Not on file    Vital Signs: Blood pressure 140/70, pulse 61, temperature 98 F (36.7 C), resp. rate 16, height _0  (1.422 m), weight 131 lb (59.4 kg), SpO2 99 %.  Examination: General Appearance: The patient is well-developed, well-nourished, and in no distress. Skin: Gross inspection of skin unremarkable. Head: normocephalic, no gross deformities. Eyes: no gross deformities noted. ENT: ears appear grossly normal no exudates. Neck: Supple. No thyromegaly. No LAD. Respiratory: no rhonchi noted at this time. Cardiovascular: Normal S1 and S2 without murmur or rub. Extremities: No cyanosis. pulses are equal. Neurologic: Alert and oriented. No involuntary movements.  LABS: Recent Results (from  the past 2160 hour(s))  ECHOCARDIOGRAM COMPLETE     Status: None   Collection Time: 06/16/20  9:02 AM  Result Value Ref Range   AR max vel 0.99 cm2   AV Peak grad 29.9 mmHg   Ao pk vel 2.74 m/s   S' Lateral 2.80 cm   Area-P 1/2 3.60 cm2   AV Area VTI 0.96 cm2   AV Mean grad 17.0 mmHg   Single Plane A4C EF 53.5 %   Single Plane A2C EF 52.3 %   Calc EF 55.2 %   AV Area mean vel 0.98 cm2  NM Myocar Multi W/Spect W/Wall Motion / EF     Status: None   Collection Time: 07/08/20 11:38 AM  Result Value Ref Range   Rest HR 70 bpm   Rest BP 129/61 mmHg   Percent HR 115 %  Peak HR 157 bpm   Peak BP 144/49 mmHg   MPHR 136 bpm   SSS 5    SRS 1    SDS 1    TID 0.76    LV sys vol 10 mL   LV dias vol 29 46 - 106 mL  CBC     Status: None   Collection Time: 07/09/20  2:52 PM  Result Value Ref Range   WBC 7.4 3.4 - 10.8 x10E3/uL   RBC 3.85 3.77 - 5.28 x10E6/uL   Hemoglobin 12.5 11.1 - 15.9 g/dL   Hematocrit 37.4 34.0 - 46.6 %   MCV 97 79 - 97 fL   MCH 32.5 26.6 - 33.0 pg   MCHC 33.4 31.5 - 35.7 g/dL   RDW 11.9 11.7 - 15.4 %   Platelets 280 150 - 450 [id number]/VP  Basic metabolic panel     Status: Abnormal   Collection Time: 07/09/20  2:52 PM  Result Value Ref Range   Glucose 159 (H) 65 - 99 mg/dL   BUN 67 (H) 8 - 27 mg/dL   Creatinine, Ser 2.18 (H) 0.57 - 1.00 mg/dL   GFR calc non Af Amer 20 (L) >59 mL/min/1.73   GFR calc Af Amer 23 (L) >59 mL/min/1.73    Comment: **In accordance with recommendations from the NKF-ASN Task force,**   [facility] is in the process of updating its eGFR calculation to the   2021 CKD-EPI creatinine equation that estimates kidney function   without a race variable.    BUN/Creatinine Ratio 31 (H) 12 - 28   Sodium 139 134 - 144 mmol/L   Potassium 4.2 3.5 - 5.2 mmol/L   Chloride 100 96 - 106 mmol/L   CO2 22 20 - 29 mmol/L   Calcium 10.0 8.7 - 10.3 mg/dL  TSH     Status: None   Collection Time: 07/09/20  2:52 PM  Result Value Ref Range   TSH 0.976 0.450 -  4.500 uIU/mL  Magnesium     Status: Abnormal   Collection Time: 07/09/20  2:52 PM  Result Value Ref Range   Magnesium 1.4 (L) 1.6 - 2.3 mg/dL    Radiology: NM Myocar Multi W/Spect W/Wall Motion / EF  Result Date: 07/09/2020 Pharmacological myocardial perfusion imaging study with no significant  ischemia Normal wall motion, EF estimated at 88% No EKG changes concerning for ischemia at peak stress or in recovery. Patient developed atrial fibrillation with RVR in recovery, rate 140 bpm with associated chest tightness.  Converted back to normal sinus rhythm at 25 min in recovery without intervention CT attenuation correction images with moderate diffuse aortic atherosclerosis, moderate coronary calcification of the LAD and RCA Low risk scan Signed, Esmond Plants, MD, Ph.D [facility]    No results found.  NM Myocar Multi W/Spect W/Wall Motion / EF  Result Date: 07/09/2020 Pharmacological myocardial perfusion imaging study with no significant  ischemia Normal wall motion, EF estimated at 88% No EKG changes concerning for ischemia at peak stress or in recovery. Patient developed atrial fibrillation with RVR in recovery, rate 140 bpm with associated chest tightness.  Converted back to normal sinus rhythm at 25 min in recovery without intervention CT attenuation correction images with moderate diffuse aortic atherosclerosis, moderate coronary calcification of the LAD and RCA Low risk scan Signed, Esmond Plants, MD, Ph.D [facility]   ECHOCARDIOGRAM COMPLETE  Result Date: 06/16/2020    ECHOCARDIOGRAM REPORT   Patient Name:   Royetta Crochet [place] Date of Exam:  06/16/2020 Medical Rec #:  [id number]  Height:       56.0 in Accession #:    [id number] Weight:       136.2 lb Date of Birth:  1933/09/30  BSA:          1.508 m Patient Age:    3 years   BP:           136/72 mmHg Patient Gender: F          HR:           70 bpm. Exam Location:  [place] Procedure: 2D Echo, Cardiac Doppler and Color Doppler Indications:     R01.1 Murmur; R06.02 SOB  History:        Patient has prior history of Echocardiogram examinations, most                 recent 02/08/2018. CAD, COPD, Aortic Valve Disease,                 Signs/Symptoms:Murmur and Dyspnea; Risk Factors:Hypertension,                 Dyslipidemia and Non-Smoker.  Sonographer:    Pilar Jarvis RDMS, RVT, RDCS Referring Phys: [id number] BRIAN AGBOR-ETANG IMPRESSIONS  1. Left ventricular ejection fraction, by estimation, is 55 to 60%. The left ventricle has normal function. The left ventricle has no regional wall motion abnormalities. Left ventricular diastolic parameters are consistent with Grade II diastolic dysfunction (pseudonormalization).  2. Right ventricular systolic function is normal. The right ventricular size is normal.  3. The mitral valve is normal in structure. Trivial mitral valve regurgitation. No evidence of mitral stenosis.  4. Aortic valve DVI = 0.33, mean Gradient 18, PG 29.. The aortic valve is calcified. Aortic valve regurgitation is not visualized. Mild to moderate aortic valve stenosis.  5. The inferior vena cava is normal in size with <50% respiratory variability, suggesting right atrial pressure of 8 mmHg. FINDINGS  Left Ventricle: Left ventricular ejection fraction, by estimation, is 55 to 60%. The left ventricle has normal function. The left ventricle has no regional wall motion abnormalities. The left ventricular internal cavity size was normal in size. There is  no left ventricular hypertrophy. Left ventricular diastolic parameters are consistent with Grade II diastolic dysfunction (pseudonormalization). Right Ventricle: The right ventricular size is normal. No increase in right ventricular wall thickness. Right ventricular systolic function is normal. Left Atrium: Left atrial size was normal in size. Right Atrium: Right atrial size was normal in size. Pericardium: There is no evidence of pericardial effusion. Mitral Valve: The mitral valve is normal in  structure. Trivial mitral valve regurgitation. No evidence of mitral valve stenosis. Tricuspid Valve: The tricuspid valve is normal in structure. Tricuspid valve regurgitation is mild . No evidence of tricuspid stenosis. Aortic Valve: Aortic valve DVI = 0.33, mean Gradient 18, PG 29. The aortic valve is calcified. Aortic valve regurgitation is not visualized. Mild to moderate aortic stenosis is present. Aortic valve mean gradient measures 17.0 mmHg. Aortic valve peak gradient measures 29.9 mmHg. Aortic valve area, by VTI measures 0.96 cm. Pulmonic Valve: The pulmonic valve was not well visualized. Pulmonic valve regurgitation is not visualized. No evidence of pulmonic stenosis. Aorta: The aortic root is normal in size and structure. Venous: The inferior vena cava is normal in size with less than 50% respiratory variability, suggesting right atrial pressure of 8 mmHg. IAS/Shunts: No atrial level shunt detected by color flow Doppler.  LEFT VENTRICLE PLAX 2D LVIDd:  4.40 cm     Diastology LVIDs:         2.80 cm     LV e' medial:    5.77 cm/s LV PW:         1.00 cm     LV E/e' medial:  16.7 LV IVS:        1.00 cm     LV e' lateral:   5.66 cm/s LVOT diam:     1.90 cm     LV E/e' lateral: 17.0 LV SV:         65 LV SV Index:   43 LVOT Area:     2.84 cm  LV Volumes (MOD) LV vol d, MOD A2C: 45.5 ml LV vol d, MOD A4C: 49.2 ml LV vol s, MOD A2C: 21.7 ml LV vol s, MOD A4C: 22.9 ml LV SV MOD A2C:     23.8 ml LV SV MOD A4C:     49.2 ml LV SV MOD BP:      27.2 ml RIGHT VENTRICLE             IVC RV Basal diam:  3.00 cm     IVC diam: 1.80 cm RV S prime:     11.00 cm/s TAPSE (M-mode): 2.5 cm LEFT ATRIUM             Index LA diam:        3.60 cm 2.39 cm/m LA Vol (A2C):   36.7 ml 24.34 ml/m LA Vol (A4C):   34.2 ml 22.68 ml/m LA Biplane Vol: 35.6 ml 23.61 ml/m  AORTIC VALVE                    PULMONIC VALVE AV Area (Vmax):    0.99 cm     PV Vmax:       0.84 m/s AV Area (Vmean):   0.98 cm     PV Peak grad:  2.8 mmHg AV  Area (VTI):     0.96 cm AV Vmax:           273.50 cm/s AV Vmean:          195.000 cm/s AV VTI:            0.670 m AV Peak Grad:      29.9 mmHg AV Mean Grad:      17.0 mmHg LVOT Vmax:         95.30 cm/s LVOT Vmean:        67.500 cm/s LVOT VTI:          0.228 m LVOT/AV VTI ratio: 0.34  AORTA Ao Root diam: 2.60 cm Ao Asc diam:  2.90 cm MITRAL VALVE                TRICUSPID VALVE MV Area (PHT): 3.60 cm     TR Peak grad:   22.7 mmHg MV Decel Time: 211 msec     TR Vmax:        238.00 cm/s MV E velocity: 96.40 cm/s MV A velocity: 111.00 cm/s  SHUNTS MV E/A ratio:  0.87         Systemic VTI:  0.23 m                             Systemic Diam: 1.90 cm Kate Sable MD Electronically signed by Kate Sable MD Signature Date/Time: 06/16/2020/5:49:45 PM    Final  Assessment and Plan: Patient Active Problem List   Diagnosis Date Noted  . Generalized abdominal pain 12/30/2019  . Stage 3 chronic kidney disease ([place]) 06/30/2019  . Urinary tract infection with hematuria 12/20/2018  . Low back pain 12/20/2018  . Bilateral carotid artery stenosis 06/19/2018  . Cellulitis of finger of right hand 03/13/2018  . Encounter for screening mammogram for malignant neoplasm of breast 12/18/2017  . Vitamin D deficiency 12/18/2017  . Dysuria 12/18/2017  . Chronic obstructive pulmonary disease ([place]) 08/17/2017  . SOB (shortness of breath) 08/17/2017  . GERD (gastroesophageal reflux disease) 08/17/2017  . Hyperlipidemia 08/17/2017  . Hypertension 08/17/2017  . Hypothyroidism 08/17/2017  . Osteoarthritis 08/17/2017    1. Atrial fib being worked up with cardiology Also would be of concern to make certain there is no sleep disordered breathing in the presence of CHF diastolic and A Fib. In addition was noted to have aortic stenosis.  2. Other hypersomnia the patient has significant issues with excessive daytime somnolence and she feels so tired.  In the setting of having fatigue and tiredness and also having  atrial fibrillation we will proceed with a sleep study evaluation. 3. GERD controlled will monitor 4. COPD on inhalers will continue to monitor 5. SOB baseline right now worsening with exertion  General Counseling: I have discussed the findings of the evaluation and examination with [facility].  I have also discussed any further diagnostic evaluation thatmay be needed or ordered today. Nasiyah verbalizes understanding of the findings of todays visit. We also reviewed her medications today and discussed drug interactions and side effects including but not limited excessive drowsiness and altered mental states. We also discussed that there is always a risk not just to her but also people around her. she has been encouraged to call the office with any questions or concerns that should arise related to todays visit.  Orders Placed This Encounter  Procedures  . Spirometry with Graph    Order Specific Question:   Where should this test be performed?    Answer:   Other  . Pulmonary function test    Standing Status:   Future    Standing Expiration Date:   07/15/2021    Order Specific Question:   Where should this test be performed?    Answer:   [facility]  . PSG Sleep Study    Standing Status:   Future    Standing Expiration Date:   07/15/2021    Order Specific Question:   Where should this test be performed:    Answer:   [facility]     Time spent: 51  I have personally obtained a history, examined the patient, evaluated laboratory and imaging results, formulated the assessment and plan and placed orders.    Allyne Gee, MD [facility] Pulmonary and Critical Care Sleep medicine

## 2020-07-15 NOTE — Telephone Encounter (Signed)
Placed orders for sleep study at [facility] folder to be picked up. Brenda Tucker

## 2020-07-16 ENCOUNTER — Other Ambulatory Visit
Admission: RE | Admit: 2020-07-16 | Discharge: 2020-07-16 | Disposition: A | Discharge status: Home / Self Care | Payer: PPO | Attending: Physician Assistant | Admitting: Physician Assistant

## 2020-07-16 ENCOUNTER — Telehealth: Payer: Self-pay | Admitting: *Deleted

## 2020-07-16 ENCOUNTER — Ambulatory Visit: Payer: PPO | Admitting: Cardiology

## 2020-07-16 ENCOUNTER — Other Ambulatory Visit: Payer: Self-pay

## 2020-07-16 ENCOUNTER — Other Ambulatory Visit: Payer: PPO

## 2020-07-16 DIAGNOSIS — E039 Hypothyroidism, unspecified: Secondary | ICD-10-CM

## 2020-07-16 DIAGNOSIS — Z79899 Other long term (current) drug therapy: Secondary | ICD-10-CM | POA: Diagnosis not present

## 2020-07-16 DIAGNOSIS — R0600 Dyspnea, unspecified: Secondary | ICD-10-CM

## 2020-07-16 DIAGNOSIS — I10 Essential (primary) hypertension: Secondary | ICD-10-CM | POA: Diagnosis not present

## 2020-07-16 DIAGNOSIS — I4891 Unspecified atrial fibrillation: Secondary | ICD-10-CM

## 2020-07-16 DIAGNOSIS — I350 Nonrheumatic aortic (valve) stenosis: Secondary | ICD-10-CM | POA: Diagnosis not present

## 2020-07-16 DIAGNOSIS — R0609 Other forms of dyspnea: Secondary | ICD-10-CM

## 2020-07-16 DIAGNOSIS — I5189 Other ill-defined heart diseases: Secondary | ICD-10-CM

## 2020-07-16 DIAGNOSIS — E785 Hyperlipidemia, unspecified: Secondary | ICD-10-CM | POA: Diagnosis not present

## 2020-07-16 LAB — HEPATIC FUNCTION PANEL
ALT: 14 U/L (ref 0–44)
AST: 19 U/L (ref 15–41)
Albumin: 3.5 g/dL (ref 3.5–5.0)
Alkaline Phosphatase: 51 U/L (ref 38–126)
Bilirubin, Direct: 0.1 mg/dL (ref 0.0–0.2)
Indirect Bilirubin: 1 mg/dL — ABNORMAL HIGH (ref 0.3–0.9)
Total Bilirubin: 1.1 mg/dL (ref 0.3–1.2)
Total Protein: 7.3 g/dL (ref 6.5–8.1)

## 2020-07-16 LAB — LIPID PANEL
Cholesterol: 107 mg/dL (ref 0–200)
HDL: 56 mg/dL (ref >40)
LDL Cholesterol: 33 mg/dL (ref 0–99)
Total CHOL/HDL Ratio: 1.9 RATIO
Triglycerides: 89 mg/dL (ref <150)
VLDL: 18 mg/dL (ref 0–40)

## 2020-07-16 LAB — BASIC METABOLIC PANEL
Anion gap: 11 (ref 5–15)
BUN: 68 mg/dL — ABNORMAL HIGH (ref 8–23)
CO2: 27 mmol/L (ref 22–32)
Calcium: 9.6 mg/dL (ref 8.9–10.3)
Chloride: 102 mmol/L (ref 98–111)
Creatinine, Ser: 1.85 mg/dL — ABNORMAL HIGH (ref 0.44–1.00)
GFR, Estimated: 27 mL/min — ABNORMAL LOW (ref >60)
Glucose, Bld: 157 mg/dL — ABNORMAL HIGH (ref 70–99)
Potassium: 4 mmol/L (ref 3.5–5.1)
Sodium: 140 mmol/L (ref 135–145)

## 2020-07-16 MED ORDER — CARVEDILOL 25 MG PO TABS: 25.0000 mg | ORAL_TABLET | Freq: Two times a day (BID) | ORAL | 3 refills | Status: DC

## 2020-07-16 MED ORDER — LEVOTHYROXINE SODIUM 50 MCG PO TABS: 50.0000 ug | ORAL_TABLET | Freq: Every day | ORAL | 3 refills | Status: DC

## 2020-07-16 NOTE — Telephone Encounter (Signed)
Patient returning phone call 

## 2020-07-16 NOTE — Telephone Encounter (Signed)
Reviewed results and recommendations with patient in detail. We discussed monitoring of blood pressures, increase water intake, and follow up with her PCP. She states that she has appt with PCP on 08/19/20 and will monitor her blood pressures. Reviewed need for repeat labs and she verbalized understanding. She verbalized understanding of our conversation, agreement with plan, and had no further questions at this time.

## 2020-07-16 NOTE — Telephone Encounter (Signed)
-----   Message from Rise Mu, PA-C sent at 07/16/2020  1:25 PM EST ----- Renal function is improving, though remains  above her prior Potassium at goal Random glucose is elevated  Cholesterol is well controlled Liver function is normal  Recommendations: -Continue to hold Lasix and losartan -Take Lasix only as needed for weight gain > 3 pounds overnight or > 5 pounds in a week -Increase water intake  -Recheck BMET 2 weeks -Let us know if BP starts to consistently run > 158 systolic  -Follow up with PCP for hyperglycemia

## 2020-07-16 NOTE — Telephone Encounter (Signed)
Left voicemail message to call back for results and recommendations. 

## 2020-07-21 NOTE — Telephone Encounter (Signed)
Opened in error

## 2020-07-25 NOTE — Procedures (Signed)
[place], [place] [id number]  DATE OF SERVICE: July 14, 2020  CAROTID DOPPLER INTERPRETATION:  Bilateral Carotid Ultrsasound and Color Doppler Examination was performed. The RIGHT CCA shows moderate plaque in the vessel. The LEFT CCA shows moderate plaque in the vessel. There was no significant intimal thickening noted in the RIGHT carotid artery. There was no significant intimal thickening in the LEFT carotid artery.  The RIGHT CCA shows peak systolic velocity of 61 cm per second. The end diastolic velocity is 12 cm per second on the RIGHT side. The RIGHT ICA shows peak systolic velocity of 94 per second. RIGHT sided ICA end diastolic velocity is 18 cm per second. The RIGHT ECA shows a peak systolic velocity of 850 cm per second. The ICA/CCA ratio is calculated to be 1.5. This suggests less than 50% stenosis. The Vertebral Artery shows antegrade flow.  The LEFT CCA shows peak systolic velocity of 70 cm per second. The end diastolic velocity is 16 cm per second on the LEFT side. The LEFT ICA shows peak systolic velocity of 76 per second. LEFT sided ICA end diastolic velocity is 21 cm per second. The LEFT ECA shows a peak systolic velocity of 97 cm per second. The ICA/CCA ratio is calculated to be 1.08. This suggests less than 50% stenosis. The Vertebral Artery shows antegrade flow.   Impression:    The RIGHT CAROTID shows less than 50% stenosis. The LEFT CAROTID shows less than 50% stenosis.  There is moderate plaque formation noted on the LEFT and moderate plaque on the RIGHT  side. Consider a repeat Carotid doppler if clinical situation and symptoms warrant in 6-12 months. Patient should be encouraged to change lifestyles such as smoking cessation, regular exercise and dietary modification. Use of statins in the right clinical setting and ASA is encouraged.  Allyne Gee, MD [facility] Pulmonary Critical Care Medicine

## 2020-07-27 ENCOUNTER — Encounter: Payer: Self-pay | Admitting: Internal Medicine

## 2020-07-30 ENCOUNTER — Other Ambulatory Visit
Admission: RE | Admit: 2020-07-30 | Discharge: 2020-07-30 | Disposition: A | Discharge status: Home / Self Care | Payer: PPO | Attending: Physician Assistant | Admitting: Physician Assistant

## 2020-07-30 ENCOUNTER — Other Ambulatory Visit: Payer: Self-pay

## 2020-07-30 DIAGNOSIS — I5189 Other ill-defined heart diseases: Secondary | ICD-10-CM | POA: Insufficient documentation

## 2020-07-30 DIAGNOSIS — R0600 Dyspnea, unspecified: Secondary | ICD-10-CM | POA: Diagnosis not present

## 2020-07-30 DIAGNOSIS — I4891 Unspecified atrial fibrillation: Secondary | ICD-10-CM | POA: Insufficient documentation

## 2020-07-30 DIAGNOSIS — R0609 Other forms of dyspnea: Secondary | ICD-10-CM

## 2020-07-30 LAB — BASIC METABOLIC PANEL
Anion gap: 11 (ref 5–15)
BUN: 25 mg/dL — ABNORMAL HIGH (ref 8–23)
CO2: 28 mmol/L (ref 22–32)
Calcium: 9.6 mg/dL (ref 8.9–10.3)
Chloride: 98 mmol/L (ref 98–111)
Creatinine, Ser: 1.6 mg/dL — ABNORMAL HIGH (ref 0.44–1.00)
GFR, Estimated: 32 mL/min — ABNORMAL LOW (ref >60)
Glucose, Bld: 124 mg/dL — ABNORMAL HIGH (ref 70–99)
Potassium: 4.8 mmol/L (ref 3.5–5.1)
Sodium: 137 mmol/L (ref 135–145)

## 2020-08-02 ENCOUNTER — Telehealth: Payer: Self-pay | Admitting: *Deleted

## 2020-08-02 ENCOUNTER — Other Ambulatory Visit: Payer: Self-pay | Admitting: Adult Health

## 2020-08-02 DIAGNOSIS — R319 Hematuria, unspecified: Secondary | ICD-10-CM | POA: Diagnosis not present

## 2020-08-02 DIAGNOSIS — I129 Hypertensive chronic kidney disease with stage 1 through stage 4 chronic kidney disease, or unspecified chronic kidney disease: Secondary | ICD-10-CM | POA: Diagnosis not present

## 2020-08-02 DIAGNOSIS — E039 Hypothyroidism, unspecified: Secondary | ICD-10-CM

## 2020-08-02 DIAGNOSIS — N1832 Chronic kidney disease, stage 3b: Secondary | ICD-10-CM | POA: Diagnosis not present

## 2020-08-02 MED ORDER — LEVOTHYROXINE SODIUM 50 MCG PO TABS: 50.0000 ug | ORAL_TABLET | Freq: Every day | ORAL | 3 refills | Status: DC

## 2020-08-02 MED ORDER — ATORVASTATIN CALCIUM 10 MG PO TABS: 10.0000 mg | ORAL_TABLET | Freq: Every day | ORAL | 0 refills | Status: DC

## 2020-08-02 NOTE — Telephone Encounter (Signed)
Left voicemail message to call back for monitor results.

## 2020-08-02 NOTE — Telephone Encounter (Signed)
Spoke with patient and reviewed monitor results with recommendations to keep upcoming appointment. She states that she is having some severe stomach pains and her atorvastatin was increased from 10 mg to 40 mg and that her pains started after the increased dose. She reports the paperwork has abdominal pain is reported as a side effect. Inquired about her diet to see if there have been any changes which could cause this pain as well. She denied any changes or new foods with the only thing other than the increase in dose. Advised that I would send this to provider for his review and would be in touch either today or tomorrow with his recommendations. She verbalized understanding with no further questions at this time.

## 2020-08-02 NOTE — Telephone Encounter (Signed)
Abdominal cramping/distress can be associated with atorvastatin.  She can go back down to her previously dose of 10 mg/day given that she was tolerating this without issues.  She can discuss alternative treatments with her primary cardiologist when she is seen in follow-up on 2/7.

## 2020-08-02 NOTE — Telephone Encounter (Signed)
-----   Message from Rise Mu, PA-C sent at 07/30/2020  5:14 PM EST ----- Outpatient cardiac monitoring showed a predominant rhythm of sinus with an average rate of 62 bpm (range 52 to 162 bpm.  No evidence of A. fib or flutter noted.  Follow-up with primary cardiologist as directed next month.

## 2020-08-02 NOTE — Telephone Encounter (Signed)
Reviewed recommendations with patient to resume previous dose of atorvastatin 10 mg once daily until her upcoming appointment with her primary cardiologist next Monday. She confirmed to have enough of the 10 mg pills to last until then. She was appreciative for the call back with no further questions at this time.

## 2020-08-02 NOTE — Telephone Encounter (Signed)
Patient returning call.

## 2020-08-09 ENCOUNTER — Ambulatory Visit (INDEPENDENT_AMBULATORY_CARE_PROVIDER_SITE_OTHER): Payer: PPO | Admitting: Cardiology

## 2020-08-09 ENCOUNTER — Other Ambulatory Visit: Payer: Self-pay

## 2020-08-09 ENCOUNTER — Encounter: Payer: Self-pay | Admitting: Cardiology

## 2020-08-09 VITALS — BP 156/92 | HR 69 | Ht <= 58 in | Wt 132.0 lb

## 2020-08-09 DIAGNOSIS — R600 Localized edema: Secondary | ICD-10-CM

## 2020-08-09 DIAGNOSIS — R0609 Other forms of dyspnea: Secondary | ICD-10-CM

## 2020-08-09 DIAGNOSIS — R0600 Dyspnea, unspecified: Secondary | ICD-10-CM

## 2020-08-09 DIAGNOSIS — I350 Nonrheumatic aortic (valve) stenosis: Secondary | ICD-10-CM | POA: Diagnosis not present

## 2020-08-09 DIAGNOSIS — I10 Essential (primary) hypertension: Secondary | ICD-10-CM

## 2020-08-09 MED ORDER — LOSARTAN POTASSIUM 50 MG PO TABS: 50.0000 mg | ORAL_TABLET | Freq: Every day | ORAL | 5 refills | Status: DC

## 2020-08-09 NOTE — Patient Instructions (Signed)
Medication Instructions:   1.  START taking Losartan (Cozaar) 50 MG: Take 1 tab by mouth daily.   Lab Work: None ordered If you have labs (blood work) drawn today and your tests are completely normal, you will receive your results only by: Marland Kitchen MyChart Message (if you have MyChart) OR . A paper copy in the mail If you have any lab test that is abnormal or we need to change your treatment, we will call you to review the results.   Testing/Procedures: None ordered   Follow-Up: At [facility], you and your health needs are our priority.  As part of our continuing mission to provide you with exceptional heart care, we have created designated Provider Care Teams.  These Care Teams include your primary Cardiologist (physician) and Advanced Practice Providers (APPs -  Physician Assistants and Nurse Practitioners) who all work together to provide you with the care you need, when you need it.  We recommend signing up for the patient portal called "MyChart".  Sign up information is provided on this After Visit Summary.  MyChart is used to connect with patients for Virtual Visits (Telemedicine).  Patients are able to view lab/test results, encounter notes, upcoming appointments, etc.  Non-urgent messages can be sent to your provider as well.   To learn more about what you can do with MyChart, go to [email or web address].    Your next appointment:   6 month(s)  The format for your next appointment:   In Person  Provider:   Kate Sable, MD   Other Instructions

## 2020-08-09 NOTE — Progress Notes (Signed)
Cardiology Office Note:    Date:  08/09/2020   ID:  Brenda Tucker, DOB [birth year], MRN [id number]  PCP:  Ronnell Freshwater, NP  [facility] Cardiologist:  Kate Sable, MD  [place] Electrophysiologist:  None   Referring MD: Ronnell Freshwater, NP   Chief Complaint  Patient presents with  . Other    1 month follow up. Meds reviewed verbally with patient     History of Present Illness:    Brenda Tucker is a 87 y.o. female with a hx of hypertension, hyperlipidemia, CKD 3, COPD, never smoker who presents for follow-up.   Previously seen with shortness of breath, edema.  Due to risk factors, dyspnea on exertion, Lexiscan Myoview was ordered.  Noted to go into A. fib after receiving Lexiscan.  Resolved after stress testing.  Cardiac monitor did not reveal any further episodes of A. fib.  Patient presents for follow-up.  She still complains of shortness of breath with exertion.  Prior notes Outside echocardiogram on 01/2018 showed normal systolic function, EF 84%, aortic valve calcification. Stop Lipitor in the past due to diarrhea. Episode of A. fib in the context of Lexiscan usage.  Follow-up cardiac monitor with no recurrence of A. fib.  Past Medical History:  Diagnosis Date  . Arthritis   . Asthma   . Cancer (HCC)    skin, colon polyp  . Chronic kidney disease   . Complication of anesthesia    asthma attack per pt  . COPD (chronic obstructive pulmonary disease) ([place])   . Dyspnea   . Hypertension   . Hypothyroidism     Past Surgical History:  Procedure Laterality Date  . ABDOMINAL HYSTERECTOMY    . APPENDECTOMY    . CHOLECYSTECTOMY N/A 03/12/2020   Procedure: LAPAROSCOPIC CHOLECYSTECTOMY;  Surgeon: Fredirick Maudlin, MD;  Location: [facility] ORS;  Service: General;  Laterality: N/A;  . COLONOSCOPY WITH PROPOFOL N/A 07/09/2015   Procedure: COLONOSCOPY WITH PROPOFOL;  Surgeon: Manya Silvas, MD;  Location: [facility] ENDOSCOPY;  Service: Endoscopy;  Laterality: N/A;     Current Medications: Current Meds  Medication Sig  . acetaminophen (TYLENOL) 500 MG tablet Take 500 mg by mouth every 6 (six) hours as needed for moderate pain or headache.  . Albuterol Sulfate (PROAIR RESPICLICK) 132 (90 Base) MCG/ACT AEPB Inhale 2 puffs into the lungs every 4 (four) hours as needed.  Marland Kitchen aspirin EC 81 MG tablet Take 81 mg by mouth daily.  Marland Kitchen atorvastatin (LIPITOR) 10 MG tablet Take 1 tablet (10 mg total) by mouth daily.  . calcium carbonate (OSCAL) 1500 (600 Ca) MG TABS tablet Take 600 mg of elemental calcium by mouth daily.  . carvedilol (COREG) 25 MG tablet Take 1 tablet (25 mg total) by mouth 2 (two) times daily.  . Cholecalciferol (VITAMIN D) 50 MCG (2000 UT) tablet Take 2,000 Units by mouth daily.  . furosemide (LASIX) 20 MG tablet Take 1 tablet (20 mg total) by mouth daily as needed (for weight gain greater than 3 pounds overnight and 5 pounds in 1 week.).  Marland Kitchen latanoprost (XALATAN) 0.005 % ophthalmic solution Place 1 drop into both eyes at bedtime.  Marland Kitchen levothyroxine (SYNTHROID) 50 MCG tablet Take 1 tablet (50 mcg total) by mouth daily before breakfast.  . losartan (COZAAR) 50 MG tablet Take 1 tablet (50 mg total) by mouth daily.  . Magnesium Oxide 400 MG CAPS Take 1 capsule (400 mg total) by mouth daily.  . vitamin B-12 (CYANOCOBALAMIN) 1000 MCG tablet Take  1,000 mcg by mouth daily.     Allergies:   Theodrenaline   Social History   Socioeconomic History  . Marital status: Widowed    Spouse name: Not on file  . Number of children: Not on file  . Years of education: Not on file  . Highest education level: Not on file  Occupational History  . Not on file  Tobacco Use  . Smoking status: Never Smoker  . Smokeless tobacco: Never Used  . Tobacco comment: second hand smoke  Vaping Use  . Vaping Use: Never used  Substance and Sexual Activity  . Alcohol use: No  . Drug use: No  . Sexual activity: Not on file  Other Topics Concern  . Not on file  Social  History Narrative  . Not on file   Social Determinants of Health   Financial Resource Strain: Not on file  Food Insecurity: Not on file  Transportation Needs: Not on file  Physical Activity: Not on file  Stress: Not on file  Social Connections: Not on file     Family History: The patient's family history includes Cancer in her brother, sister, and sister; Early death in her sister and sister; Heart attack in her father; Lung cancer in her brother; Stomach cancer in her brother. There is no history of Breast cancer.  ROS:   Please see the history of present illness.     All other systems reviewed and are negative.  EKGs/Labs/Other Studies Reviewed:    The following studies were reviewed today:   EKG:  EKG not ordered today.    Recent Labs: 07/09/2020: Hemoglobin 12.5; Magnesium 1.4; Platelets 280; TSH 0.976 07/16/2020: ALT 14 07/30/2020: BUN 25; Creatinine, Ser 1.60; Potassium 4.8; Sodium 137  Recent Lipid Panel    Component Value Date/Time   CHOL 107 07/16/2020 1148   CHOL 212 (H) 12/31/2019 0853   TRIG 89 07/16/2020 1148   HDL 56 07/16/2020 1148   HDL 60 12/31/2019 0853   CHOLHDL 1.9 07/16/2020 1148   VLDL 18 07/16/2020 1148   LDLCALC 33 07/16/2020 1148   LDLCALC 130 (H) 12/31/2019 0853     Risk Assessment/Calculations:      Physical Exam:    VS:  BP (!) 156/92 (BP Location: Left Arm, Patient Position: Sitting, Cuff Size: Normal)   Pulse 69   Ht 4\' 8"  (1.422 m)   Wt 132 lb (59.9 kg)   SpO2 98%   BMI 29.59 kg/m     Wt Readings from Last 3 Encounters:  08/09/20 132 lb (59.9 kg)  07/15/20 131 lb (59.4 kg)  07/09/20 129 lb (58.5 kg)     GEN:  Well nourished, well developed in no acute distress HEENT: Normal NECK: No JVD; No carotid bruits LYMPHATICS: No lymphadenopathy CARDIAC: RRR, 2/6 systolic murmur RESPIRATORY:  Clear to auscultation without rales, wheezing or rhonchi  ABDOMEN: Soft, non-tender, non-distended MUSCULOSKELETAL:  trace edema; No  deformity  SKIN: Warm and dry NEUROLOGIC:  Alert and oriented x 3 PSYCHIATRIC:  Normal affect   ASSESSMENT:    1. Exertional dyspnea   2. Primary hypertension   3. Aortic valve stenosis, etiology of cardiac valve disease unspecified   4. Leg edema    PLAN:    In order of problems listed above:  1. Patient with dyspnea on exertion.   Echo showed systolic function was normal, EF 55 to 61%, grade 2 diastolic dysfunction.  Orient with no ischemia.  Low risk scan.  Shortness of breath likely  from COPD. 2. History of hypertension, BP elevated.  CKD 3 patient.  Previously on losartan 100mg  qd. unsure why this was stopped. start losartan 50 mg daily. 3. Mild to moderate aortic valve stenosis,.  Monitor with serial echocardiograms. 4. Lower extremity edema, grade 2 diastolic dysfunction, Lasix 20 mg daily as needed.  Follow-up in 6 months   Shared Decision Making/Informed Consent      Medication Adjustments/Labs and Tests Ordered: Current medicines are reviewed at length with the patient today.  Concerns regarding medicines are outlined above.  No orders of the defined types were placed in this encounter.  Meds ordered this encounter  Medications  . losartan (COZAAR) 50 MG tablet    Sig: Take 1 tablet (50 mg total) by mouth daily.    Dispense:  30 tablet    Refill:  5    Patient Instructions  Medication Instructions:   1.  START taking Losartan (Cozaar) 50 MG: Take 1 tab by mouth daily.   Lab Work: None ordered If you have labs (blood work) drawn today and your tests are completely normal, you will receive your results only by: Marland Kitchen MyChart Message (if you have MyChart) OR . A paper copy in the mail If you have any lab test that is abnormal or we need to change your treatment, we will call you to review the results.   Testing/Procedures: None ordered   Follow-Up: At [facility], you and your health needs are our priority.  As part of our continuing mission  to provide you with exceptional heart care, we have created designated Provider Care Teams.  These Care Teams include your primary Cardiologist (physician) and Advanced Practice Providers (APPs -  Physician Assistants and Nurse Practitioners) who all work together to provide you with the care you need, when you need it.  We recommend signing up for the patient portal called "MyChart".  Sign up information is provided on this After Visit Summary.  MyChart is used to connect with patients for Virtual Visits (Telemedicine).  Patients are able to view lab/test results, encounter notes, upcoming appointments, etc.  Non-urgent messages can be sent to your provider as well.   To learn more about what you can do with MyChart, go to [email or web address].    Your next appointment:   6 month(s)  The format for your next appointment:   In Person  Provider:   Kate Sable, MD   Other Instructions      Signed, Kate Sable, MD  08/09/2020 4:38 PM    [place]

## 2020-08-11 ENCOUNTER — Encounter (INDEPENDENT_AMBULATORY_CARE_PROVIDER_SITE_OTHER): Payer: PPO | Admitting: Internal Medicine

## 2020-08-11 ENCOUNTER — Ambulatory Visit: Payer: PPO | Admitting: Internal Medicine

## 2020-08-11 ENCOUNTER — Telehealth: Payer: Self-pay

## 2020-08-11 DIAGNOSIS — G4733 Obstructive sleep apnea (adult) (pediatric): Secondary | ICD-10-CM | POA: Diagnosis not present

## 2020-08-11 NOTE — Telephone Encounter (Signed)
Sleep study scheduled 08-11-20 f/u on 08-24-20 for ss results

## 2020-08-13 NOTE — Procedures (Signed)
[facility] Report Part I                                                               Phone: [phone] Fax: [phone]  Patient Name: Brenda Tucker, Brenda Tucker Acquisition Number: [id number]  Date of Birth: 08-29-1933 Acquisition Date: 08/11/2020  Referring Physician: Theodoro Grist, NP     History: The patient is a 87 year old female who was referred for evaluation of possible sleep apnea. Medical History: arthritis ,asthma, cancer, skin, colon polyp, chronic kidney disease, dyspnea, hypertension, hypothyroidism.   Medications: acetaminophen, albuterol sulfate, aspirin, atorvastatin, calcium carbonate, cholecalciiferol, fluticasone-salmeterol, furosemide, lantanoprost, levothyroxine, losartan, magnesium oxide, multiple vitamins -minerals, tramodol, vitamin B-12.  Procedure: This routine overnight polysomnogram was performed on the [facility] 5 using the standard diagnostic protocol. This included 6 channels of EEG, 2 channels of EOG, chin EMG, bilateral anterior tibialis EMG, nasal/oral thermistor, PTAF (nasal pressure transducer), chest and abdominal wall movements, EKG, and pulse oximetry.  Description: The total recording time was 405.5 minutes. The total sleep time was 277.4 minutes. There were a total of 44.0 minutes of wakefulness after sleep onset for a?reduced?sleep efficiency of 68.4%. The latency to sleep onset was prolonged at 84.1 minutes. The R sleep onset latency was??prolonged at 308.5 minutes. Sleep parameters, as a percentage of the total sleep time, demonstrated 5.9% of sleep was in N1 sleep, 87.6% N2, 2.0% N3 and 4.5% R sleep. There were a total of 79 arousals for an arousal index of 17.1 arousals per hour of sleep that was ???elevated.  Respiratory monitoring demonstrated nearly continuous moderate to severe degree of snoring in all positions. Less than 16 minutes of non-supine sleep were observed. There were 152 apneas and hypopneas  for an Apnea Hypopnea Index of 32.9 apneas and hypopneas per hour of sleep. The REM related apnea hypopnea index was 52.8/hr of REM sleep compared to a NREM AHI of 31.5/hr. The Respiratory Disturbance Index, which includes 8 respiratory effort related arousals (RERAs), was 34.6 respiratory events per hour of sleep.  The average duration of the respiratory events was 28.0 seconds with a maximum duration of 56.0 seconds. The respiratory events occurred in all positions. The respiratory events were associated with peripheral oxygen desaturations on the average to 86%. The lowest oxygen desaturation associated with a respiratory event was 77%. Additionally, the baseline oxygen saturation during wakefulness was 92%, during NREM sleep averaged 90%, and during REM sleep averaged 89%. The total duration of oxygen < 90% was 82.6 minutes and <80% was 0.2 minutes.  Cardiac monitoring- did not demonstrate transient cardiac decelerations associated with the apneas. There were no significant cardiac rhythm irregularities.   Periodic limb movement monitoring- demonstrated that there were 34 periodic limb movements for a periodic limb movement index of 7.4 periodic limb movements per hour of sleep.   Impression: ???This routine overnight polysomnogram demonstrated significant obstructive sleep apnea with an overall Apnea Hypopnea Index of 32.9 apneas and hypopneas per hour of sleep. Most sleep was in the supine position.  Periodic limb movements were observed but most could not be scored due to the frequency of the sleep apnea. These limb movements sometimes subside once the  apnea is controlled. Clinical correlation would be suggested.   There was a reduced sleep efficiency with an?elevated arousal index,?increased awakenings?and reduced percentages of REM and slow wave sleep. These findings would appear to be due to the obstructive sleep apnea.????????  ??Recommendations:    1. A CPAP titration would be recommended  due to the severity of the sleep apnea. Some supine sleep should be ensured to optimize the titration. 2. Additionally, would recommend weight loss in a patient with a BMI of 29.6.     Allyne Gee, MD, [facility] Diplomate ABMS-Pulmonary, Critical Care and Sleep Medicine  Electronically reviewed and digitally signed      [facility] Report Part II  Phone: [phone] Fax: [phone]  Patient last name Tucker Neck Size 13 in. Acquisition [id number]  Patient first name Brenda Weight 132.0 lbs. Started 08/11/2020 at 10:10:55 PM  Birth date 08/22/1933 Height 56.0 in. Stopped 08/12/2020 at 5:04:25 AM  Age 6 BMI 29.6 lb/in2 Duration 405.5  Study Type Adult      Report generated by: Adriana Mccallum, RPSGT Sleep Data: Lights Out: 10:14:49 PM Sleep Onset: 11:38:55 PM  Lights On: 5:00:19 AM Sleep Efficiency: 68.4 %  Total Recording Time: 405.5 min Sleep Latency (from Lights Off) 84.1 min  Total Sleep Time (TST): 277.4 min R Latency (from Sleep Onset): 308.5 min  Sleep Period Time: 321.4 min Total number of awakenings: 18  Wake during sleep: 44.0 min Wake After Sleep Onset (WASO): 44.0 min   Sleep Data:         Arousal Summary: Stage  Latency from lights out (min) Latency from sleep onset (min) Duration (min) % Total Sleep Time  Normal values  N 1 84.1 0.0 16.5 5.9 (5%)  N 2 86.6 2.5 242.9 87.6 (50%)  N 3 178.6 94.5 5.5 2.0 (20%)  R 392.6 308.5 12.5 4.5 (25%)    Number Index  Spontaneous 106 22.9  Apneas & Hypopneas 70 15.1  RERAs 8 1.7       (Apneas & Hypopneas & RERAs)  (78) (16.9)  Limb Movement 19 4.1  Snore 0 0.0  TOTAL 203 43.9      Respiratory Data:  CA OA MA Apnea Hypopnea* A+ H RERA Total  Number 0 0 0 0 152 152 8 160  Mean Dur (sec) 0.0 0.0 0.0 0.0 28.3 28.3 22.1 28.0  Max Dur (sec) 0.0 0.0 0.0 0.0 56.0 56.0 27.5 56.0  Total Dur (min) 0.0 0.0 0.0 0.0 71.7 71.7 2.9 74.6  % of TST 0.0 0.0 0.0 0.0 25.8 25.8 1.1 26.9  Index (#/h TST) 0.0 0.0 0.0  0.0 32.9 32.9 1.7 34.6  *Hypopneas scored based on 4% or greater desaturation.  Sleep Stage:     Body Position Data:   REM NREM TST  AHI 52.8 31.5 32.9  RDI 52.8 33.3 34.6          Sleep (min) TST (%) REM (min) NREM (min) CA (#) OA (#) MA (#) HYP (#) AHI (#/h) RERA (#) RDI (#/h) Desat (#)  Supine 261.7 94.34 12.1 249.6 0 0 0 149 34.2 8 36.0 158  Non-Supine 15.70 5.66 0.40 15.30 0.00 0.00 0.00 3.00 11.46 0 11.46 3.00  Right: 15.7 5.66 0.4 15.3 0 0 0 3 11.5 0 11.5 3       Snoring: Total number of snoring episodes  0  Total time with snoring    min (   % of sleep)   Oximetry Distribution:  WK REM NREM TOTAL  Average (%)   92 89 90 91  < 90% 4.5 7.2 70.9 82.6  < 80% 0.0 0.2 0.0 0.2  < 70% 0.0 0.0 0.0 0.0  # of Desaturations* 4 12 145 161  Desat Index (#/hour) 1.9 57.6 32.8 34.8  Desat Max (%) 7 14 10 14   Desat Max Dur (sec) 49.0 86.0 87.0 87.0  Approx Min O2 during sleep 77  Approx min O2 during a respiratory event 77  Was Oxygen added (Y/N) and final rate No:   0 LPM  *Desaturations based on 4% or greater drop from baseline.   Cheyne Stokes Breathing: None Present   Hypoventilation: None Present    Heart Rate Summary:  Average Heart Rate During Sleep 62.3 bpm      Highest Heart Rate During Sleep (95th %) 69.0 bpm      Highest Heart Rate During Sleep 203 bpm (artifact)  Highest Heart Rate During Recording (TIB) 216 bpm (artifact)   Heart Rate Observations: Event Type # Events   Bradycardia 0 Lowest HR Scored: N/A  Sinus Tachycardia During Sleep 0 Highest HR Scored: N/A  Narrow Complex Tachycardia 0 Highest HR Scored: N/A  Wide Complex Tachycardia 0 Highest HR Scored: N/A  Asystole 0 Longest Pause: N/A  Atrial Fibrillation 0 Duration Longest Event: N/A  Other Arrythmias  No Type:    Periodic Limb Movement Data: (Primary legs unless otherwise noted) Total # Limb Movement 54 Limb Movement Index 11.7  Total # PLMS 34 PLMS Index 7.4  Total  # PLMS Arousals 16 PLMS Arousal Index 3.5  Percentage Sleep Time with PLMS 17.82min (6.4 % sleep)  Mean Duration limb movements (secs) 213.3

## 2020-08-19 ENCOUNTER — Encounter: Payer: Self-pay | Admitting: Hospice and Palliative Medicine

## 2020-08-19 ENCOUNTER — Other Ambulatory Visit: Payer: Self-pay

## 2020-08-19 ENCOUNTER — Ambulatory Visit (INDEPENDENT_AMBULATORY_CARE_PROVIDER_SITE_OTHER): Payer: PPO | Admitting: Hospice and Palliative Medicine

## 2020-08-19 VITALS — BP 138/74 | HR 65 | Temp 95.6°F | Resp 16 | Ht <= 58 in | Wt 130.2 lb

## 2020-08-19 DIAGNOSIS — R0602 Shortness of breath: Secondary | ICD-10-CM | POA: Diagnosis not present

## 2020-08-19 DIAGNOSIS — G4733 Obstructive sleep apnea (adult) (pediatric): Secondary | ICD-10-CM | POA: Diagnosis not present

## 2020-08-19 DIAGNOSIS — I6523 Occlusion and stenosis of bilateral carotid arteries: Secondary | ICD-10-CM | POA: Diagnosis not present

## 2020-08-19 DIAGNOSIS — I10 Essential (primary) hypertension: Secondary | ICD-10-CM | POA: Diagnosis not present

## 2020-08-19 NOTE — Progress Notes (Signed)
[facility] [place], [place] [address]  Internal MEDICINE  Office Visit Note  Patient Name: Brenda [place]  [id number]  [id number]  Date of Service: 08/22/2020  Chief Complaint  Patient presents with  . Follow-up  . Hypertension  . COPD  . Asthma    HPI Patient is here for routine follow-up Has had several studies/procedures done, she is not pleased with having to undergo all of this testing Carotid US--bilaterally less than 50% stenosis with moderate plaque formation Had a cardiac stress test ordered by cardiology--during stress test briefly had an episode of atrial fibrillation Had event monitoring due to A. Fib on stress test--no further episodes of A. Fib captured PSG-AHI 32.9, lowest SpO2 77%  Continues to feel short of breath with exertion but has remained stable, not interested in further testing or work-up Happy with how she is feeling for her age--not wanting to necessarily prolong her life, she has accepted that she has multiple health issues and has focused on her faith  Current Medication: Outpatient Encounter Medications as of 08/19/2020  Medication Sig  . Fluticasone-Salmeterol (ADVAIR) 500-50 MCG/DOSE AEPB Inhale 1 puff into the lungs 2 (two) times daily.  Marland Kitchen losartan (COZAAR) 100 MG tablet Take 100 mg by mouth daily.  Marland Kitchen acetaminophen (TYLENOL) 500 MG tablet Take 500 mg by mouth every 6 (six) hours as needed for moderate pain or headache.  . Albuterol Sulfate (PROAIR RESPICLICK) 376 (90 Base) MCG/ACT AEPB Inhale 2 puffs into the lungs every 4 (four) hours as needed.  Marland Kitchen aspirin EC 81 MG tablet Take 81 mg by mouth daily.  Marland Kitchen atorvastatin (LIPITOR) 10 MG tablet Take 1 tablet (10 mg total) by mouth daily.  . calcium carbonate (OSCAL) 1500 (600 Ca) MG TABS tablet Take 600 mg of elemental calcium by mouth daily.  . carvedilol (COREG) 25 MG tablet Take 1 tablet (25 mg total) by mouth 2 (two) times daily.  . Cholecalciferol (VITAMIN D) 50 MCG (2000 UT)  tablet Take 2,000 Units by mouth daily.  . furosemide (LASIX) 20 MG tablet Take 1 tablet (20 mg total) by mouth daily as needed (for weight gain greater than 3 pounds overnight and 5 pounds in 1 week.).  Marland Kitchen latanoprost (XALATAN) 0.005 % ophthalmic solution Place 1 drop into both eyes at bedtime.  Marland Kitchen levothyroxine (SYNTHROID) 50 MCG tablet Take 1 tablet (50 mcg total) by mouth daily before breakfast.  . Magnesium Oxide 400 MG CAPS Take 1 capsule (400 mg total) by mouth daily.  . vitamin B-12 (CYANOCOBALAMIN) 1000 MCG tablet Take 1,000 mcg by mouth daily.  . [DISCONTINUED] losartan (COZAAR) 50 MG tablet Take 1 tablet (50 mg total) by mouth daily.   No facility-administered encounter medications on file as of 08/19/2020.    Surgical History: Past Surgical History:  Procedure Laterality Date  . ABDOMINAL HYSTERECTOMY    . APPENDECTOMY    . CHOLECYSTECTOMY N/A 03/12/2020   Procedure: LAPAROSCOPIC CHOLECYSTECTOMY;  Surgeon: Fredirick Maudlin, MD;  Location: [facility] ORS;  Service: General;  Laterality: N/A;  . COLONOSCOPY WITH PROPOFOL N/A 07/09/2015   Procedure: COLONOSCOPY WITH PROPOFOL;  Surgeon: Manya Silvas, MD;  Location: [facility] ENDOSCOPY;  Service: Endoscopy;  Laterality: N/A;    Medical History: Past Medical History:  Diagnosis Date  . Arthritis   . Asthma   . Cancer (HCC)    skin, colon polyp  . Chronic kidney disease   . Complication of anesthesia    asthma attack per pt  . COPD (  chronic obstructive pulmonary disease) ([place])   . Dyspnea   . Hypertension   . Hypothyroidism     Family History: Family History  Problem Relation Age of Onset  . Heart attack Father   . Early death Sister        MVA  . Stomach cancer Brother   . Cancer Brother        shoulder  . Lung cancer Brother   . Cancer Sister   . Cancer Sister   . Early death Sister        husband killed her  . Breast cancer Neg Hx     Social History   Socioeconomic History  . Marital status: Widowed    Spouse  name: Not on file  . Number of children: Not on file  . Years of education: Not on file  . Highest education level: Not on file  Occupational History  . Not on file  Tobacco Use  . Smoking status: Never Smoker  . Smokeless tobacco: Never Used  . Tobacco comment: second hand smoke  Vaping Use  . Vaping Use: Never used  Substance and Sexual Activity  . Alcohol use: No  . Drug use: No  . Sexual activity: Not on file  Other Topics Concern  . Not on file  Social History Narrative  . Not on file   Social Determinants of Health   Financial Resource Strain: Not on file  Food Insecurity: Not on file  Transportation Needs: Not on file  Physical Activity: Not on file  Stress: Not on file  Social Connections: Not on file  Intimate Partner Violence: Not on file    Review of Systems  Constitutional: Negative for chills, diaphoresis and fatigue.  HENT: Negative for ear pain, postnasal drip and sinus pressure.   Eyes: Negative for photophobia, discharge, redness, itching and visual disturbance.  Respiratory: Positive for shortness of breath. Negative for cough and wheezing.   Cardiovascular: Negative for chest pain, palpitations and leg swelling.  Gastrointestinal: Negative for abdominal pain, constipation, diarrhea, nausea and vomiting.  Genitourinary: Negative for dysuria and flank pain.  Musculoskeletal: Negative for arthralgias, back pain, gait problem and neck pain.  Skin: Negative for color change.  Allergic/Immunologic: Negative for environmental allergies and food allergies.  Neurological: Negative for dizziness and headaches.  Hematological: Does not bruise/bleed easily.  Psychiatric/Behavioral: Negative for agitation, behavioral problems (depression) and hallucinations.    Vital Signs: BP 138/74   Pulse 65   Temp (!) 95.6 F (35.3 C)   Resp 16   Ht 4\' 8"  (1.422 m)   Wt 130 lb 3.2 oz (59.1 kg)   SpO2 98%   BMI 29.19 kg/m    Physical Exam Vitals reviewed.   Constitutional:      Appearance: Normal appearance. She is normal weight.  Cardiovascular:     Rate and Rhythm: Normal rate and regular rhythm.     Pulses: Normal pulses.     Heart sounds: Normal heart sounds.  Pulmonary:     Effort: Pulmonary effort is normal.     Breath sounds: Normal breath sounds.  Abdominal:     General: Abdomen is flat.     Palpations: Abdomen is soft.  Musculoskeletal:        General: Normal range of motion.     Cervical back: Normal range of motion.  Skin:    General: Skin is warm.  Neurological:     General: No focal deficit present.     Mental Status: She  is alert and oriented to person, place, and time. Mental status is at baseline.  Psychiatric:        Mood and Affect: Mood normal.        Behavior: Behavior normal.        Thought Content: Thought content normal.        Judgment: Judgment normal.    Assessment/Plan: 1. Bilateral carotid artery stenosis Continue statin and ASA therapy  2. Essential hypertension BP and HR well controlled today, continue to montor  3. OSA (obstructive sleep apnea) Lengthy discussion about OSA and her AHI being moderate Understands untreated sleep apnea increases her risk of CVA and MI--further increased risk due to episode of A. Fib on cardiac stress test Explain next steps CPAP titration study or APAP Not convinced that she will be able to sleep with CPAP and she only sleep on her stomach--not wanting further testing Encouraged her to talk with her family and consider a trail with CPAP She will contact office once she has made her decision  4. Shortness of breath Continue with Advair--discussed optimizing therapy and possibly trying Trelegy--she is not interested, feels Advair is working well for her Likely combination of cardiac and pulmonary etiology  General Counseling: Marne verbalizes understanding of the findings of todays visit and agrees with plan of treatment. I have discussed any further diagnostic  evaluation that [place] be needed or ordered today. We also reviewed her medications today. she has been encouraged to call the office with any questions or concerns that should arise related to todays visit.   Time spent: 30 Minutes Time spent includes review of chart, medications, test results and follow-up plan with the patient.  This patient was seen by Theodoro Grist AGNP-C in Collaboration with Dr Lavera Guise as a part of collaborative care agreement     Tanna Furry. Leeanne Butters AGNP-C Internal medicine

## 2020-08-22 ENCOUNTER — Encounter: Payer: Self-pay | Admitting: Hospice and Palliative Medicine

## 2020-08-24 ENCOUNTER — Encounter: Payer: Self-pay | Admitting: Internal Medicine

## 2020-08-24 ENCOUNTER — Other Ambulatory Visit: Payer: Self-pay

## 2020-08-24 ENCOUNTER — Ambulatory Visit (INDEPENDENT_AMBULATORY_CARE_PROVIDER_SITE_OTHER): Payer: PPO | Admitting: Internal Medicine

## 2020-08-24 VITALS — BP 128/78 | HR 60 | Temp 97.8°F | Resp 16 | Ht <= 58 in | Wt 131.0 lb

## 2020-08-24 DIAGNOSIS — G4733 Obstructive sleep apnea (adult) (pediatric): Secondary | ICD-10-CM | POA: Diagnosis not present

## 2020-08-24 DIAGNOSIS — K219 Gastro-esophageal reflux disease without esophagitis: Secondary | ICD-10-CM | POA: Diagnosis not present

## 2020-08-24 DIAGNOSIS — J449 Chronic obstructive pulmonary disease, unspecified: Secondary | ICD-10-CM | POA: Diagnosis not present

## 2020-08-24 DIAGNOSIS — J4489 Other specified chronic obstructive pulmonary disease: Secondary | ICD-10-CM

## 2020-08-24 NOTE — Patient Instructions (Signed)

## 2020-08-24 NOTE — Progress Notes (Signed)
[facility] [place], [place] [id number]  Pulmonary Sleep Medicine   Office Visit Note  Patient Name: Brenda [place] DOB: 1933/09/22 MRN [id number]  Date of Service: 08/24/2020  Complaints/HPI: OSA COPDThe patient had a sleep study done which revealed severe obstructive sleep apnea which might be contributing to some of her symptoms and also be related to overlap syndrome with her underlying history of COPD.  I reviewed the sleep study with her in detail explained tear this severe DA of oxygen desaturation and also the severity of obstructive sleep apnea.  The patient states that done a she is 87 years old she also cannot put anything on her face and she is more than likely not going to use the CPAP device.  I did on try to convince her to at least give it a try for a few months to see if she would be able to be compliant however she is not interested at this point.  We did get her to agree that she will use oxygen therapy at nighttime  ROS  General: (-) fever, (-) chills, (-) night sweats, (-) weakness Skin: (-) rashes, (-) itching,. Eyes: (-) visual changes, (-) redness, (-) itching. Nose and Sinuses: (-) nasal stuffiness or itchiness, (-) postnasal drip, (-) nosebleeds, (-) sinus trouble. Mouth and Throat: (-) sore throat, (-) hoarseness. Neck: (-) swollen glands, (-) enlarged thyroid, (-) neck pain. Respiratory: - cough, (-) bloody sputum, + shortness of breath, - wheezing. Cardiovascular: +ankle swelling, (-) chest pain. Lymphatic: (-) lymph node enlargement. Neurologic: (-) numbness, (-) tingling. Psychiatric: (-) anxiety, (-) depression   Current Medication: Outpatient Encounter Medications as of 08/24/2020  Medication Sig  . acetaminophen (TYLENOL) 500 MG tablet Take 500 mg by mouth every 6 (six) hours as needed for moderate pain or headache.  . Albuterol Sulfate (PROAIR RESPICLICK) 383 (90 Base) MCG/ACT AEPB Inhale 2 puffs into the lungs every 4 (four)  hours as needed.  Marland Kitchen aspirin EC 81 MG tablet Take 81 mg by mouth daily.  Marland Kitchen atorvastatin (LIPITOR) 10 MG tablet Take 1 tablet (10 mg total) by mouth daily.  . calcium carbonate (OSCAL) 1500 (600 Ca) MG TABS tablet Take 600 mg of elemental calcium by mouth daily.  . carvedilol (COREG) 25 MG tablet Take 1 tablet (25 mg total) by mouth 2 (two) times daily.  . Cholecalciferol (VITAMIN D) 50 MCG (2000 UT) tablet Take 2,000 Units by mouth daily.  . Fluticasone-Salmeterol (ADVAIR) 500-50 MCG/DOSE AEPB Inhale 1 puff into the lungs 2 (two) times daily.  . furosemide (LASIX) 20 MG tablet Take 1 tablet (20 mg total) by mouth daily as needed (for weight gain greater than 3 pounds overnight and 5 pounds in 1 week.).  Marland Kitchen latanoprost (XALATAN) 0.005 % ophthalmic solution Place 1 drop into both eyes at bedtime.  Marland Kitchen levothyroxine (SYNTHROID) 50 MCG tablet Take 1 tablet (50 mcg total) by mouth daily before breakfast.  . losartan (COZAAR) 100 MG tablet Take 100 mg by mouth daily.  . Magnesium Oxide 400 MG CAPS Take 1 capsule (400 mg total) by mouth daily.  . vitamin B-12 (CYANOCOBALAMIN) 1000 MCG tablet Take 1,000 mcg by mouth daily.   No facility-administered encounter medications on file as of 08/24/2020.    Surgical History: Past Surgical History:  Procedure Laterality Date  . ABDOMINAL HYSTERECTOMY    . APPENDECTOMY    . CHOLECYSTECTOMY N/A 03/12/2020   Procedure: LAPAROSCOPIC CHOLECYSTECTOMY;  Surgeon: Fredirick Maudlin, MD;  Location: [facility] ORS;  Service: General;  Laterality: N/A;  . COLONOSCOPY WITH PROPOFOL N/A 07/09/2015   Procedure: COLONOSCOPY WITH PROPOFOL;  Surgeon: Manya Silvas, MD;  Location: [facility] ENDOSCOPY;  Service: Endoscopy;  Laterality: N/A;    Medical History: Past Medical History:  Diagnosis Date  . Arthritis   . Asthma   . Cancer (HCC)    skin, colon polyp  . Chronic kidney disease   . Complication of anesthesia    asthma attack per pt  . COPD (chronic obstructive pulmonary  disease) ([place])   . Dyspnea   . Hypertension   . Hypothyroidism     Family History: Family History  Problem Relation Age of Onset  . Heart attack Father   . Early death Sister        MVA  . Stomach cancer Brother   . Cancer Brother        shoulder  . Lung cancer Brother   . Cancer Sister   . Cancer Sister   . Early death Sister        husband killed her  . Breast cancer Neg Hx     Social History: Social History   Socioeconomic History  . Marital status: Widowed    Spouse name: Not on file  . Number of children: Not on file  . Years of education: Not on file  . Highest education level: Not on file  Occupational History  . Not on file  Tobacco Use  . Smoking status: Never Smoker  . Smokeless tobacco: Never Used  . Tobacco comment: second hand smoke  Vaping Use  . Vaping Use: Never used  Substance and Sexual Activity  . Alcohol use: No  . Drug use: No  . Sexual activity: Not on file  Other Topics Concern  . Not on file  Social History Narrative  . Not on file   Social Determinants of Health   Financial Resource Strain: Not on file  Food Insecurity: Not on file  Transportation Needs: Not on file  Physical Activity: Not on file  Stress: Not on file  Social Connections: Not on file  Intimate Partner Violence: Not on file    Vital Signs: Blood pressure 128/78, pulse 60, temperature 97.8 F (36.6 C), resp. rate 16, height 4' 8"  (1.422 m), weight 131 lb (59.4 kg), SpO2 97 %.  Examination: General Appearance: The patient is well-developed, well-nourished, and in no distress. Skin: Gross inspection of skin unremarkable. Head: normocephalic, no gross deformities. Eyes: no gross deformities noted. ENT: ears appear grossly normal no exudates. Neck: Supple. No thyromegaly. No LAD. Respiratory: few rhonchi noted. Cardiovascular: Normal S1 and S2 without murmur or rub. Extremities: No cyanosis. pulses are equal. Neurologic: Alert and oriented. No involuntary  movements.  LABS: Recent Results (from the past 2160 hour(s))  ECHOCARDIOGRAM COMPLETE     Status: None   Collection Time: 06/16/20  9:02 AM  Result Value Ref Range   AR max vel 0.99 cm2   AV Peak grad 29.9 mmHg   Ao pk vel 2.74 m/s   S' Lateral 2.80 cm   Area-P 1/2 3.60 cm2   AV Area VTI 0.96 cm2   AV Mean grad 17.0 mmHg   Single Plane A4C EF 53.5 %   Single Plane A2C EF 52.3 %   Calc EF 55.2 %   AV Area mean vel 0.98 cm2  NM Myocar Multi W/Spect W/Wall Motion / EF     Status: None   Collection Time: 07/08/20 11:38 AM  Result  Value Ref Range   Rest HR 70 bpm   Rest BP 129/61 mmHg   Percent HR 115 %   Peak HR 157 bpm   Peak BP 144/49 mmHg   MPHR 136 bpm   SSS 5    SRS 1    SDS 1    TID 0.76    LV sys vol 10 mL   LV dias vol 29 46 - 106 mL  CBC     Status: None   Collection Time: 07/09/20  2:52 PM  Result Value Ref Range   WBC 7.4 3.4 - 10.8 x10E3/uL   RBC 3.85 3.77 - 5.28 x10E6/uL   Hemoglobin 12.5 11.1 - 15.9 g/dL   Hematocrit 37.4 34.0 - 46.6 %   MCV 97 79 - 97 fL   MCH 32.5 26.6 - 33.0 pg   MCHC 33.4 31.5 - 35.7 g/dL   RDW 11.9 11.7 - 15.4 %   Platelets 280 150 - 450 X58I3/GP  Basic metabolic panel     Status: Abnormal   Collection Time: 07/09/20  2:52 PM  Result Value Ref Range   Glucose 159 (H) 65 - 99 mg/dL   BUN 67 (H) 8 - 27 mg/dL   Creatinine, Ser 2.18 (H) 0.57 - 1.00 mg/dL   GFR calc non Af Amer 20 (L) >59 mL/min/1.73   GFR calc Af Amer 23 (L) >59 mL/min/1.73    Comment: **In accordance with recommendations from the NKF-ASN Task force,**   [facility] is in the process of updating its eGFR calculation to the   2021 CKD-EPI creatinine equation that estimates kidney function   without a race variable.    BUN/Creatinine Ratio 31 (H) 12 - 28   Sodium 139 134 - 144 mmol/L   Potassium 4.2 3.5 - 5.2 mmol/L   Chloride 100 96 - 106 mmol/L   CO2 22 20 - 29 mmol/L   Calcium 10.0 8.7 - 10.3 mg/dL  TSH     Status: None   Collection Time: 07/09/20  2:52 PM   Result Value Ref Range   TSH 0.976 0.450 - 4.500 uIU/mL  Magnesium     Status: Abnormal   Collection Time: 07/09/20  2:52 PM  Result Value Ref Range   Magnesium 1.4 (L) 1.6 - 2.3 mg/dL  Basic metabolic panel     Status: Abnormal   Collection Time: 07/16/20 11:48 AM  Result Value Ref Range   Sodium 140 135 - 145 mmol/L   Potassium 4.0 3.5 - 5.1 mmol/L   Chloride 102 98 - 111 mmol/L   CO2 27 22 - 32 mmol/L   Glucose, Bld 157 (H) 70 - 99 mg/dL    Comment: Glucose reference range applies only to samples taken after fasting for at least 8 hours.   BUN 68 (H) 8 - 23 mg/dL   Creatinine, Ser 1.85 (H) 0.44 - 1.00 mg/dL   Calcium 9.6 8.9 - 10.3 mg/dL   GFR, Estimated 27 (L) >60 mL/min    Comment: (NOTE) Calculated using the CKD-EPI Creatinine Equation (2021)    Anion gap 11 5 - 15    Comment: Performed at [facility], [address] [place], [place] [address]  Lipid panel     Status: None   Collection Time: 07/16/20 11:48 AM  Result Value Ref Range   Cholesterol 107 0 - 200 mg/dL   Triglycerides 89 <150 mg/dL   HDL 56 >40 mg/dL   Total CHOL/HDL Ratio 1.9 RATIO   VLDL 18  0 - 40 mg/dL   LDL Cholesterol 33 0 - 99 mg/dL    Comment:        Total Cholesterol/HDL:CHD Risk Coronary Heart Disease Risk Table                     Men   Women  1/2 Average Risk   3.4   3.3  Average Risk       5.0   4.4  2 X Average Risk   9.6   7.1  3 X Average Risk  23.4   11.0        Use the calculated Patient Ratio above and the CHD Risk Table to determine the patient's CHD Risk.        ATP III CLASSIFICATION (LDL):  <100     mg/dL   Optimal  100-129  mg/dL   Near or Above                    Optimal  130-159  mg/dL   Borderline  160-189  mg/dL   High  >190     mg/dL   Very High Performed at [facility], [address] [place], [place] [address]   Hepatic function panel     Status: Abnormal   Collection Time: 07/16/20 11:48 AM  Result Value Ref Range   Total Protein 7.3  6.5 - 8.1 g/dL   Albumin 3.5 3.5 - 5.0 g/dL   AST 19 15 - 41 U/L   ALT 14 0 - 44 U/L   Alkaline Phosphatase 51 38 - 126 U/L   Total Bilirubin 1.1 0.3 - 1.2 mg/dL   Bilirubin, Direct 0.1 0.0 - 0.2 mg/dL   Indirect Bilirubin 1.0 (H) 0.3 - 0.9 mg/dL    Comment: Performed at [facility], [address], [place], [place] [address]  Basic metabolic panel     Status: Abnormal   Collection Time: 07/30/20 12:55 PM  Result Value Ref Range   Sodium 137 135 - 145 mmol/L   Potassium 4.8 3.5 - 5.1 mmol/L   Chloride 98 98 - 111 mmol/L   CO2 28 22 - 32 mmol/L   Glucose, Bld 124 (H) 70 - 99 mg/dL    Comment: Glucose reference range applies only to samples taken after fasting for at least 8 hours.   BUN 25 (H) 8 - 23 mg/dL   Creatinine, Ser 1.60 (H) 0.44 - 1.00 mg/dL   Calcium 9.6 8.9 - 10.3 mg/dL   GFR, Estimated 32 (L) >60 mL/min    Comment: (NOTE) Calculated using the CKD-EPI Creatinine Equation (2021)    Anion gap 11 5 - 15    Comment: Performed at [facility], [address], [place], [place] [address]    Radiology: [place] (3-14 DAYS)  Result Date: 07/30/2020 Patch Wear Time:  13 days and 17 hours (2022-01-07T14:51:14-0500 to 2022-01-21T08:32:08-498) Patient had a min HR of 52 bpm, max HR of 162 bpm, and avg HR of 62 bpm. Predominant underlying rhythm was Sinus Rhythm. No evidence of atrial fibrillation or atrial flutter noted on cardiac monitor.   No results found.  LONG TERM MONITOR (3-14 DAYS)  Result Date: 07/30/2020 Patch Wear Time:  13 days and 17 hours (2022-01-07T14:51:14-0500 to 2022-01-21T08:32:08-498) Patient had a min HR of 52 bpm, max HR of 162 bpm, and avg HR of 62 bpm. Predominant underlying rhythm was Sinus Rhythm. No evidence of atrial fibrillation or atrial flutter noted on  cardiac monitor.     Assessment and Plan: Patient Active Problem List   Diagnosis Date Noted  . Generalized abdominal pain 12/30/2019  . Stage 3 chronic  kidney disease ([place]) 06/30/2019  . Urinary tract infection with hematuria 12/20/2018  . Low back pain 12/20/2018  . Bilateral carotid artery stenosis 06/19/2018  . Cellulitis of finger of right hand 03/13/2018  . Encounter for screening mammogram for malignant neoplasm of breast 12/18/2017  . Vitamin D deficiency 12/18/2017  . Dysuria 12/18/2017  . Chronic obstructive pulmonary disease ([place]) 08/17/2017  . SOB (shortness of breath) 08/17/2017  . GERD (gastroesophageal reflux disease) 08/17/2017  . Hyperlipidemia 08/17/2017  . Hypertension 08/17/2017  . Hypothyroidism 08/17/2017  . Osteoarthritis 08/17/2017    1. OSA (obstructive sleep apnea)  severe disease based on on the sleep study with a apnea-hypopnea index of 32.9 however the patient is not interested in on going on CPAP therapy.  It should be noted that her apnea-hypopnea index was significantly worse during REM sleep and this also was explained to her.  She also had significant oxygen desaturation likely related to overlap with her COPD so will try to get her on home O2.  This will be used during the night. - For home use only DME oxygen  2. Obstructive chronic bronchitis without exacerbation ([place])   The patient on has on moderate obstructive lung disease based on the last on pulmonary functions that were done and she needs to continue with the on using inhalers.  Now she will be on oxygen therapy which will be started on at night right now she does not want to go on oxygen during the daytime. - For home use only DME oxygen  3. GERD without esophagitis   PPI usage as needed will continue with supportive care.   General Counseling: I have discussed the findings of the evaluation and examination with [facility].  I have also discussed any further diagnostic evaluation thatmay be needed or ordered today. Regina verbalizes understanding of the findings of todays visit. We also reviewed her medications today and discussed drug interactions and  side effects including but not limited excessive drowsiness and altered mental states. We also discussed that there is always a risk not just to her but also people around her. she has been encouraged to call the office with any questions or concerns that should arise related to todays visit.  No orders of the defined types were placed in this encounter.    Time spent: 52  I have personally obtained a history, examined the patient, evaluated laboratory and imaging results, formulated the assessment and plan and placed orders.    Allyne Gee, MD [facility] Pulmonary and Critical Care Sleep medicine

## 2020-08-25 ENCOUNTER — Telehealth: Payer: Self-pay

## 2020-08-25 NOTE — Telephone Encounter (Signed)
Gave [facility] orders for overnight oxygen. Brenda [place]

## 2020-08-26 DIAGNOSIS — J449 Chronic obstructive pulmonary disease, unspecified: Secondary | ICD-10-CM | POA: Diagnosis not present

## 2020-09-29 ENCOUNTER — Other Ambulatory Visit: Payer: Self-pay | Admitting: Hospice and Palliative Medicine

## 2020-11-11 ENCOUNTER — Telehealth: Payer: Self-pay | Admitting: Internal Medicine

## 2020-11-11 NOTE — Progress Notes (Signed)
  Chronic Care Management   Outreach Note  11/11/2020 Name: Brenda [place] MRN: [id number] DOB: 1934-02-13  Referred by: Lavera Guise, MD Reason for referral : No chief complaint on file.   An unsuccessful telephone outreach was attempted today. The patient was referred to the pharmacist for assistance with care management and care coordination.   Follow Up Plan:   Carley Perdue [employer] Scheduler

## 2020-11-12 ENCOUNTER — Telehealth: Payer: Self-pay | Admitting: Internal Medicine

## 2020-11-12 NOTE — Progress Notes (Signed)
  Chronic Care Management   Note  11/12/2020 Name: Brenda Tucker MRN: [id number] DOB: 1933-07-24  Brenda Tucker is a 87 y.o. year old female who is a primary care Tucker of Lavera Guise, MD. I reached out to Brenda Tucker by phone today in response to a referral sent by Brenda Tucker's PCP, Lavera Guise, MD.   Brenda Tucker was given information about Chronic Care Management services today including:  1. CCM service includes personalized support from designated clinical staff supervised by her physician, including individualized plan of care and coordination with other care providers 2. 24/7 contact phone numbers for assistance for urgent and routine care needs. 3. Service will only be billed when office clinical staff spend 20 minutes or more in a month to coordinate care. 4. Only one practitioner [place] furnish and bill Brenda service in a calendar month. 5. Brenda Tucker Brenda Tucker stop CCM services at any time (effective at Brenda end of Brenda month) by phone call to Brenda office staff.   Tucker agreed to services and verbal consent obtained.   Follow up plan:   Carley Perdue [employer] Scheduler

## 2020-11-12 NOTE — Progress Notes (Signed)
  Chronic Care Management   Outreach Note  11/12/2020 Name: Brenda Tucker MRN: [id number] DOB: 11/14/1933  Referred by: Lavera Guise, MD Reason for referral : No chief complaint on file.   A second unsuccessful telephone outreach was attempted today. The patient was referred to pharmacist for assistance with care management and care coordination.  Follow Up Plan:   Carley Perdue [employer] Scheduler

## 2020-11-23 ENCOUNTER — Other Ambulatory Visit: Payer: Self-pay

## 2020-11-23 DIAGNOSIS — J449 Chronic obstructive pulmonary disease, unspecified: Secondary | ICD-10-CM | POA: Diagnosis not present

## 2020-11-23 MED ORDER — FLUTICASONE-SALMETEROL 250-50 MCG/ACT IN AEPB: INHALATION_SPRAY | RESPIRATORY_TRACT | 3 refills | Status: DC

## 2020-12-13 ENCOUNTER — Telehealth: Payer: Self-pay | Admitting: Pharmacist

## 2020-12-13 NOTE — Progress Notes (Signed)
Chronic Care Management Pharmacy Note  12/15/2020 Name:  Brenda Tucker MRN:  [id number] DOB:  [birth year]  Summary: Initial visit with PharmD.  Reviewed med list.  No changes to meds recommended.  Asked her to monitor and record BP especially after increased dose of Losartan.  Discussed BP goals.  She did reports she could only tolerate Lipitor every other day.  Recommendations/Changes made from today's visit: Will request updated Rx for Losartan 158m tablets Discuss changing sig for Lipitor to every other day for insurance adherence purposed  Plan: FU 4 months   Subjective: Brenda FerrallMay is an 87y.o. year old female who is a primary patient of KHumphrey Rolls FTimoteo Gaul MD.  The CCM team was consulted for assistance with disease management and care coordination needs.    Engaged with patient face to face for initial visit in response to provider referral for pharmacy case management and/or care coordination services.   Consent to Services:  The patient was given the following information about Chronic Care Management services today, agreed to services, and gave verbal consent: 1. CCM service includes personalized support from designated clinical staff supervised by the primary care provider, including individualized plan of care and coordination with other care providers 2. 24/7 contact phone numbers for assistance for urgent and routine care needs. 3. Service will only be billed when office clinical staff spend 20 minutes or more in a month to coordinate care. 4. Only one practitioner [place] furnish and bill the service in a calendar month. 5.The patient [place] stop CCM services at any time (effective at the end of the month) by phone call to the office staff. 6. The patient will be responsible for cost sharing (co-pay) of up to 20% of the service fee (after annual deductible is met). Patient agreed to services and consent obtained.  Patient Care Team: KLavera Guise MD as PCP - General (Internal  Medicine) AKate Sable MD as PCP - Cardiology (Cardiology) [facility]Edythe Clarity [facility] (Pharmacist)  Recent office visits:  08/24/20 KMarlaine Hind MD. For follow-up. No medication changes. 08/19/20 HLuiz Ochoa NP. For follow-up. CHANGED Losartan Potassium to 100 mg daily. 07/15/20 Dr. KHumphrey RollsFor follow-up. Per note: Discussed sleep apnea. No medication changes.    Recent consult visits:  08/09/20 Cardiology AKate Sable MD. For follow-up. STARTED Losartan Potassium 50 mg daily. STOPPED Fluticasone, multiple vitamins, tramadol. 08/02/20 (Telephone) Cardiology AValora Corporal RN. DECREASED Atorvastatin 10 mg daily. 08/02/20 Nephrology Kolluru, SLurena Nida MD. STOPPED Trelegy Ellipta. 07/12/20 (Telephone) Cardiology STARTED Magnesium Oxide 400 mg daily. Per note:Hold Lasix, take only as needed for weight gain > 3 pounds overnight or > 5 pounds in a week, Hold losartan until recheck, if renal function is improved at that time, we will resume this. -Random glucose elevated, needs to follow up with PCP for further evaluation 07/12/20 Optometry TOdette Fraction No information given. 07/09/20 Cardiology Dunn, RAreta Haber PA-C. For follow-up. Labs drawn.STARTED Atorvastatin 40 mg daily. INCREASED Carvedilol to 25 mg 2 times daily STOPPED Apixaben. 06/21/20 Cardiology Agbor-Etang, BAaron Edelman MD. For follow-up. STOPPED Amlodipine and Atorvastatin 10 mg.    Hospital visits:  None in previous 6 months   Mediation History: Atorvastatin 10 mg 90 DS 11/10/20 Losartan 100 mg 90 DS 11/07/20  Objective:  Lab Results  Component Value Date   CREATININE 1.60 (H) 07/30/2020   BUN 25 (H) 07/30/2020   GFRNONAA 32 (L) 07/30/2020   GFRAA 23 (L) 07/09/2020   NA 137 07/30/2020  K 4.8 07/30/2020   CALCIUM 9.6 07/30/2020   CO2 28 07/30/2020   GLUCOSE 124 (H) 07/30/2020    No results found for: HGBA1C, FRUCTOSAMINE, GFR, MICROALBUR  Last diabetic Eye exam: No results found for: HMDIABEYEEXA  Last  diabetic Foot exam: No results found for: HMDIABFOOTEX   Lab Results  Component Value Date   CHOL 107 07/16/2020   HDL 56 07/16/2020   LDLCALC 33 07/16/2020   TRIG 89 07/16/2020   CHOLHDL 1.9 07/16/2020    Hepatic Function Latest Ref Rng & Units 07/16/2020 12/31/2019 12/25/2017  Total Protein 6.5 - 8.1 g/dL 7.3 7.2 7.3  Albumin 3.5 - 5.0 g/dL 3.5 3.8 4.0  AST 15 - 41 U/L _0 ALT 0 - 44 U/L _1 Alk Phosphatase 38 - 126 U/L 51 55 47  Total Bilirubin 0.3 - 1.2 mg/dL 1.1 0.8 1.0  Bilirubin, Direct 0.0 - 0.2 mg/dL 0.1 - -    Lab Results  Component Value Date/Time   TSH 0.976 07/09/2020 02:52 PM   TSH 2.150 12/31/2019 08:53 AM   FREET4 1.38 12/31/2019 08:53 AM   FREET4 1.40 12/25/2017 09:04 AM    CBC Latest Ref Rng & Units 07/09/2020 12/31/2019 12/25/2017  WBC 3.4 - 10.8 x10E3/uL 7.4 6.8 8.4  Hemoglobin 11.1 - 15.9 g/dL 12.5 11.9 11.9  Hematocrit 34.0 - 46.6 % 37.4 36.7 35.5  Platelets 150 - 450 x10E3/uL 280 265 260    Lab Results  Component Value Date/Time   VD25OH 33.2 12/31/2019 08:53 AM   VD25OH 40.9 12/25/2017 09:04 AM    Clinical ASCVD: No  The ASCVD Risk score ([place]., et al., 2013) failed to calculate for the following reasons:   The 2013 ASCVD risk score is only valid for ages 15 to 87    Depression screen PHQ 2/9 08/19/2020 12/30/2019 06/30/2019  Decreased Interest 0 0 0  Down, Depressed, Hopeless 0 0 0  PHQ - 2 Score 0 0 0     Social History   Tobacco Use  Smoking Status Never  Smokeless Tobacco Never  Tobacco Comments   second hand smoke   BP Readings from Last 3 Encounters:  08/24/20 128/78  08/19/20 138/74  08/09/20 (!) 156/92   Pulse Readings from Last 3 Encounters:  08/24/20 60  08/19/20 65  08/09/20 69   Wt Readings from Last 3 Encounters:  08/24/20 131 lb (59.4 kg)  08/19/20 130 lb 3.2 oz (59.1 kg)  08/09/20 132 lb (59.9 kg)   BMI Readings from Last 3 Encounters:  08/24/20 29.37 kg/m  08/19/20 29.19 kg/m  08/09/20 29.59  kg/m    Assessment/Interventions: Review of patient past medical history, allergies, medications, health status, including review of consultants reports, laboratory and other test data, was performed as part of comprehensive evaluation and provision of chronic care management services.   SDOH:  (Social Determinants of Health) assessments and interventions performed: Yes  Financial Resource Strain: Low Risk    Difficulty of Paying Living Expenses: Not very hard    SDOH Screenings   Alcohol Screen: Low Risk    Last Alcohol Screening Score (AUDIT): 0  Depression (PHQ2-9): Low Risk    PHQ-2 Score: 0  Financial Resource Strain: Low Risk    Difficulty of Paying Living Expenses: Not very hard  Food Insecurity: Not on file  Housing: Not on file  Physical Activity: Not on file  Social Connections: Not on file  Stress: Not on file  Tobacco Use: Low  Risk    Smoking Tobacco Use: Never   Smokeless Tobacco Use: Never  Transportation Needs: Not on file    CCM Care Plan  Allergies  Allergen Reactions   Theodrenaline     Shaky, nervous    Medications Reviewed Today     Reviewed by Edythe Clarity, [facility] (Pharmacist) on 12/15/20 at 1202  Med List Status: <None>   Medication Order Taking? Sig Documenting Provider Last Dose Status Informant  acetaminophen (TYLENOL) 500 MG tablet [id number] Yes Take 500 mg by mouth every 6 (six) hours as needed for moderate pain or headache. [provider] Taking Active Self  Albuterol Sulfate (PROAIR RESPICLICK) 916 (90 Base) MCG/ACT AEPB [id number] Yes Inhale 2 puffs into the lungs every 4 (four) hours as needed. Kendell Bane, NP Taking Active Self  aspirin EC 81 MG tablet [id number] Yes Take 81 mg by mouth daily. [provider] Taking Active Self  atorvastatin (LIPITOR) 10 MG tablet [id number]  Take 1 tablet (10 mg total) by mouth daily. Rise Mu, PA-C  Expired 10/31/20 2359   calcium carbonate (OSCAL) 1500 (600 Ca) MG TABS  tablet [id number] Yes Take 600 mg of elemental calcium by mouth daily. [provider] Taking Active Self  carvedilol (COREG) 25 MG tablet [id number]  Take 1 tablet (25 mg total) by mouth 2 (two) times daily. Ronnell Freshwater, NP  Expired 10/14/20 2359   Cholecalciferol (VITAMIN D) 50 MCG (2000 UT) tablet [id number] Yes Take 2,000 Units by mouth daily. [provider] Taking Active Self  fluticasone-salmeterol (ADVAIR DISKUS) 250-50 MCG/ACT AEPB [id number] Yes INHALE 1 PUFF INTO THE LUNGS TWICE DAILY Lavera Guise, MD Taking Active   Fluticasone-Salmeterol (ADVAIR) 500-50 MCG/DOSE AEPB [id number] Yes Inhale 1 puff into the lungs 2 (two) times daily. [provider] Taking Active   furosemide (LASIX) 20 MG tablet [id number] Yes Take 1 tablet (20 mg total) by mouth daily as needed (for weight gain greater than 3 pounds overnight and 5 pounds in 1 week.). Rise Mu, PA-C Taking Active   latanoprost (XALATAN) 0.005 % ophthalmic solution [id number] Yes Place 1 drop into both eyes at bedtime. [provider] Taking Active Self  levothyroxine (SYNTHROID) 50 MCG tablet [id number] Yes Take 1 tablet (50 mcg total) by mouth daily before breakfast. Luiz Ochoa, NP Taking Active   losartan (COZAAR) 100 MG tablet [id number] Yes Take 100 mg by mouth daily. [provider] Taking Active   Magnesium Oxide 400 MG CAPS [id number] Yes Take 1 capsule (400 mg total) by mouth daily. Rise Mu, PA-C Taking Active   vitamin B-12 (CYANOCOBALAMIN) 1000 MCG tablet [id number] Yes Take 1,000 mcg by mouth daily. [provider] Taking Active Self            Patient Active Problem List   Diagnosis Date Noted   Generalized abdominal pain 12/30/2019   Stage 3 chronic kidney disease ([place]) 06/30/2019   Urinary tract infection with hematuria 12/20/2018   Low back pain 12/20/2018   Bilateral carotid artery stenosis 06/19/2018   Cellulitis of finger of right hand 03/13/2018    Encounter for screening mammogram for malignant neoplasm of breast 12/18/2017   Vitamin D deficiency 12/18/2017   Dysuria 12/18/2017   Chronic obstructive pulmonary disease ([place]) 08/17/2017   SOB (shortness of breath) 08/17/2017   GERD (gastroesophageal reflux disease) 08/17/2017   Hyperlipidemia 08/17/2017   Hypertension 08/17/2017   Hypothyroidism 08/17/2017   Osteoarthritis 08/17/2017    Immunization History  Administered  Date(s) Administered   Influenza,inj,Quad PF,6+ Mos 06/08/2017, 05/16/2019   Influenza-Unspecified 06/09/2017, 06/11/2018, 06/21/2020   Pneumococcal Conjugate-13 05/03/2018   Pneumococcal-Unspecified 03/24/2013   Tdap 05/16/2019    Conditions to be addressed/monitored:  HTN, COPD, GERD, Hypothyroidism, CKD, HLD  Care Plan : General Pharmacy (Adult)  Updates made by Edythe Clarity, [facility] since 12/15/2020 12:00 AM     Care Plan : General Pharmacy (Adult)  Updates made by Edythe Clarity, RPH since 12/15/2020 12:00 AM     Problem: HTN, COPD, GERD, Hypothyroidism, CKD, HLD   Priority: High     Long-Range Goal: Patient-Specific Goal   Start Date: 12/15/2020  Expected End Date: 06/16/2021  This Visit's Progress: On track  Priority: High  Note:   Current Barriers:  Unable to independently monitor therapeutic efficacy Unable to achieve control of BP   Pharmacist Clinical Goal(s):  Patient will achieve adherence to monitoring guidelines and medication adherence to achieve therapeutic efficacy achieve control of BP as evidenced by home monitoring contact provider office for questions/concerns as evidenced notation of same in electronic health record through collaboration with PharmD and provider.   Interventions: 1:1 collaboration with Lavera Guise, MD regarding development and update of comprehensive plan of care as evidenced by provider attestation and co-signature Inter-disciplinary care team collaboration (see longitudinal plan of  care) Comprehensive medication review performed; medication list updated in electronic medical record  Hypertension (BP goal <140/90) -Controlled -Current treatment: Losartan 15m two tablets po bid - requests new Rx for 104m-Medications previously tried: none noted  -Current home readings: 120-150/70-80 -Current dietary habits: admits to liking sweets and drinks one regular soda daily -Current exercise habits: minimal -Denies hypotensive/hypertensive symptoms -Educated on BP goals and benefits of medications for prevention of heart attack, stroke and kidney damage; Importance of home blood pressure monitoring; Symptoms of hypotension and importance of maintaining adequate hydration; -Counseled to monitor BP at home daily, document, and provide log at future appointments -Counseled on diet and exercise extensively Recommended to continue current medication Will request new rx for 10088maily to be called in.  Hyperlipidemia: (LDL goal < 100) -Controlled -Current treatment: Atorvastatin 65m40mtaking every other day -Medications previously tried: none noted  -Educated on Cholesterol goals;  Benefits of statin for ASCVD risk reduction; Importance of limiting foods high in cholesterol; -Recommended to continue current medication She reports that taking Lipitor daily has caused her to have upset stomach in the past, most recent LDL is well controlled.  I would rather have her take it every other day that not at all.  Will need to update rx with correct instructions for adherence purposes.  Pre-Diabetes (A1c goal <6.5%) -Controlled -Current medications: None -Medications previously tried: none   -Current home glucose readings fasting glucose: not checking post prandial glucose: not checking -Denies hypoglycemic/hyperglycemic symptoms  -Current exercise: minimal -Educated on A1c and blood sugar goals; Complications of diabetes including kidney damage, retinal damage, and  cardiovascular disease; Exercise goal of 150 minutes per week; -Counseled to check feet daily and get yearly eye exams -Recommended continue current management Would recommend repeat A1c as I do not see one in her chart recently  COPD (Goal: control symptoms and prevent exacerbations) -Controlled -Current treatment  Advair 250/50 mcg 2 puffs BID Albuterol HFA 90mc42mn -Medications previously tried: Trelegy   -Exacerbations requiring treatment in last 6 months: none -Patient reports consistent use of maintenance inhaler -Frequency of rescue inhaler use: during times of increased activity - a few times weekly -  Counseled on Proper inhaler technique; Benefits of consistent maintenance inhaler use Differences between maintenance and rescue inhalers -Recommended to continue current medication  Hypothyroidism (Goal: Maintain TSH) -Controlled -Current treatment  Levothyroxine 50 mcg daily  -Medications previously tried: none noted -TSH WNL, takes medication appropriately  -Recommended to continue current medication  Patient Goals/Self-Care Activities Patient will:  - take medications as prescribed check blood pressure daily, document, and provide at future appointments engage in dietary modifications by limiting sweets/sugars  Follow Up Plan: The care management team will reach out to the patient again over the next 120 days.        Medication Assistance: None required.  Patient affirms current coverage meets needs.  Compliance/Adherence/Medication fill history: Care Gaps: Zoster Vaccines  Star-Rating Drugs: Atorvastatin 10 mg 90 DS 11/10/20 Losartan 100 mg 90 DS 11/07/20  Patient's preferred pharmacy is:  [place], [place] - [address] [place] [address] Phone: [phone] Fax: [id number] [address]ina [address], [place] [address] Phone: [phone] Fax: [phone]  Uses pill box? Yes Pt endorses 100% compliance  We discussed: Benefits of medication synchronization, packaging and delivery as well as enhanced pharmacist oversight with [employer]. Patient decided to: Continue current medication management strategy  Care Plan and Follow Up Patient Decision:  Patient agrees to Care Plan and Follow-up.  Plan: The care management team will reach out to the patient again over the next 120 days.  Beverly Milch, PharmD Clinical Pharmacist [facility] [phone]

## 2020-12-13 NOTE — Progress Notes (Addendum)
Chronic Care Management Pharmacy Assistant   Name: Victorya Hillman Lagunes  MRN: [id number] DOB: 12-20-1933  Brenda Tucker is an 87 y.o. year old female who presents for his initial CCM visit with the clinical pharmacist.  Reason for Encounter: Chart prep   Conditions to be addressed/monitored: HTN, COPD, GERD, CKD Stage 3, HLD, Vitamin D, Hypothyroidism.   Primary concerns for visit include: HTN and COPD.  Recent office visits:  08/24/20 Marlaine Hind, MD. For follow-up. No medication changes.  08/19/20 Luiz Ochoa, NP. For follow-up. CHANGED Losartan Potassium to 100 mg daily. 07/15/20 Dr. Humphrey Rolls For follow-up. Per note: Discussed sleep apnea. No medication changes.   Recent consult visits:  08/09/20 Cardiology Kate Sable, MD. For follow-up. STARTED Losartan Potassium 50 mg daily. STOPPED Fluticasone, multiple vitamins, tramadol.  08/02/20 (Telephone) Cardiology Valora Corporal, RN. DECREASED Atorvastatin 10 mg daily.  08/02/20 Nephrology Kolluru, Lurena Nida, MD. STOPPED Trelegy Ellipta.  07/12/20 (Telephone) Cardiology STARTED Magnesium Oxide 400 mg daily. Per note:Hold Lasix, take only as needed for weight gain > 3 pounds overnight or > 5 pounds in a week, Hold losartan until recheck, if renal function is improved at that time, we will resume this. -Random glucose elevated, needs to follow up with PCP for further evaluation 07/12/20 Optometry Odette Fraction. No information given. 07/09/20 Cardiology Dunn, Areta Haber, PA-C. For follow-up. Labs drawn.STARTED Atorvastatin 40 mg daily. INCREASED Carvedilol to 25 mg 2 times daily STOPPED Apixaben. 06/21/20 Cardiology Agbor-Etang, Aaron Edelman, MD. For follow-up. STOPPED Amlodipine and Atorvastatin 10 mg.   Hospital visits:  None in previous 6 months  Mediation History: Atorvastatin 10 mg 90 DS 11/10/20 Losartan 100 mg 90 DS 11/07/20  Medications: Outpatient Encounter Medications as of 12/13/2020  Medication Sig   acetaminophen (TYLENOL) 500 MG tablet  Take 500 mg by mouth every 6 (six) hours as needed for moderate pain or headache.   Albuterol Sulfate (PROAIR RESPICLICK) 858 (90 Base) MCG/ACT AEPB Inhale 2 puffs into the lungs every 4 (four) hours as needed.   aspirin EC 81 MG tablet Take 81 mg by mouth daily.   atorvastatin (LIPITOR) 10 MG tablet Take 1 tablet (10 mg total) by mouth daily.   calcium carbonate (OSCAL) 1500 (600 Ca) MG TABS tablet Take 600 mg of elemental calcium by mouth daily.   carvedilol (COREG) 25 MG tablet Take 1 tablet (25 mg total) by mouth 2 (two) times daily.   Cholecalciferol (VITAMIN D) 50 MCG (2000 UT) tablet Take 2,000 Units by mouth daily.   fluticasone-salmeterol (ADVAIR DISKUS) 250-50 MCG/ACT AEPB INHALE 1 PUFF INTO THE LUNGS TWICE DAILY   Fluticasone-Salmeterol (ADVAIR) 500-50 MCG/DOSE AEPB Inhale 1 puff into the lungs 2 (two) times daily.   furosemide (LASIX) 20 MG tablet Take 1 tablet (20 mg total) by mouth daily as needed (for weight gain greater than 3 pounds overnight and 5 pounds in 1 week.).   latanoprost (XALATAN) 0.005 % ophthalmic solution Place 1 drop into both eyes at bedtime.   levothyroxine (SYNTHROID) 50 MCG tablet Take 1 tablet (50 mcg total) by mouth daily before breakfast.   losartan (COZAAR) 100 MG tablet Take 100 mg by mouth daily.   Magnesium Oxide 400 MG CAPS Take 1 capsule (400 mg total) by mouth daily.   vitamin B-12 (CYANOCOBALAMIN) 1000 MCG tablet Take 1,000 mcg by mouth daily.   No facility-administered encounter medications on file as of 12/13/2020.   Have you seen any other providers since your last visit? Patient stated no.  Any changes in your medications or health? Patient stated no.  Any side effects from any medications? Patient stated she has to take her Lipitor every other day but was unable to explain to me as to why she does this, she stated she just doesn't feel right when she takes the medication daily.   Do you have an symptoms or problems not managed by your  medications? Patient stated no.  Any concerns about your health right now? Patient stated no.  Has your provider asked that you check blood pressure, blood sugar, or follow special diet at home? Patient stated no.  Do you get any type of exercise on a regular basis? Patient stated no.  Can you think of a goal you would like to reach for your health? Patient stated no.  Do you have any problems getting your medications? Patient stated no.  Is there anything that you would like to discuss during the appointment? Patient stated no.  Please bring medications and supplements to appointment, patient reminded of her face to face appointment on 12/15/20 at 11 am.   [place], [place] Pharmacist Assistant [phone]

## 2020-12-15 ENCOUNTER — Telehealth: Payer: Self-pay

## 2020-12-15 ENCOUNTER — Other Ambulatory Visit: Payer: Self-pay

## 2020-12-15 ENCOUNTER — Ambulatory Visit: Payer: PPO | Admitting: Pharmacist

## 2020-12-15 DIAGNOSIS — J449 Chronic obstructive pulmonary disease, unspecified: Secondary | ICD-10-CM

## 2020-12-15 DIAGNOSIS — N183 Chronic kidney disease, stage 3 unspecified: Secondary | ICD-10-CM

## 2020-12-15 DIAGNOSIS — E039 Hypothyroidism, unspecified: Secondary | ICD-10-CM

## 2020-12-15 DIAGNOSIS — I10 Essential (primary) hypertension: Secondary | ICD-10-CM

## 2020-12-15 NOTE — Patient Instructions (Addendum)
Visit Information   Goals Addressed             This Visit's Progress    Track and Manage My Blood Pressure-Hypertension       Timeframe:  Long-Range Goal Priority:  High Start Date:     12/15/20                        Expected End Date:     06/16/21                  Follow Up Date 04/01/21    - check blood pressure 3 times per week - choose a place to take my blood pressure (home, clinic or office, retail store) - write blood pressure results in a log or diary    Why is this important?   You won't feel high blood pressure, but it can still hurt your blood vessels.  High blood pressure can cause heart or kidney problems. It can also cause a stroke.  Making lifestyle changes like losing a little weight or eating less salt will help.  Checking your blood pressure at home and at different times of the day can help to control blood pressure.  If the doctor prescribes medicine remember to take it the way the doctor ordered.  Call the office if you cannot afford the medicine or if there are questions about it.     Notes:         Patient Care Plan: General Pharmacy (Adult)     Patient Care Plan: General Pharmacy (Adult)     Problem Identified: HTN, COPD, GERD, Hypothyroidism, CKD, HLD   Priority: High     Long-Range Goal: Patient-Specific Goal   Start Date: 12/15/2020  Expected End Date: 06/16/2021  This Visit's Progress: On track  Priority: High  Note:   Current Barriers:  Unable to independently monitor therapeutic efficacy Unable to achieve control of BP   Pharmacist Clinical Goal(s):  Patient will achieve adherence to monitoring guidelines and medication adherence to achieve therapeutic efficacy achieve control of BP as evidenced by home monitoring contact provider office for questions/concerns as evidenced notation of same in electronic health record through collaboration with PharmD and provider.   Interventions: 1:1 collaboration with Lavera Guise, MD  regarding development and update of comprehensive plan of care as evidenced by provider attestation and co-signature Inter-disciplinary care team collaboration (see longitudinal plan of care) Comprehensive medication review performed; medication list updated in electronic medical record  Hypertension (BP goal <140/90) -Controlled -Current treatment: Losartan 50mg  two tablets po bid - requests new Rx for 100mg  -Medications previously tried: none noted  -Current home readings: 120-150/70-80 -Current dietary habits: admits to liking sweets and drinks one regular soda daily -Current exercise habits: minimal -Denies hypotensive/hypertensive symptoms -Educated on BP goals and benefits of medications for prevention of heart attack, stroke and kidney damage; Importance of home blood pressure monitoring; Symptoms of hypotension and importance of maintaining adequate hydration; -Counseled to monitor BP at home daily, document, and provide log at future appointments -Counseled on diet and exercise extensively Recommended to continue current medication Will request new rx for 100mg  daily to be called in.  Hyperlipidemia: (LDL goal < 100) -Controlled -Current treatment: Atorvastatin 10mg  - taking every other day -Medications previously tried: none noted  -Educated on Cholesterol goals;  Benefits of statin for ASCVD risk reduction; Importance of limiting foods high in cholesterol; -Recommended to continue current medication She reports that taking Lipitor  daily has caused her to have upset stomach in the past, most recent LDL is well controlled.  I would rather have her take it every other day that not at all.  Will need to update rx with correct instructions for adherence purposes.  Pre-Diabetes (A1c goal <6.5%) -Controlled -Current medications: None -Medications previously tried: none   -Current home glucose readings fasting glucose: not checking post prandial glucose: not  checking -Denies hypoglycemic/hyperglycemic symptoms  -Current exercise: minimal -Educated on A1c and blood sugar goals; Complications of diabetes including kidney damage, retinal damage, and cardiovascular disease; Exercise goal of 150 minutes per week; -Counseled to check feet daily and get yearly eye exams -Recommended continue current management Would recommend repeat A1c as I do not see one in her chart recently  COPD (Goal: control symptoms and prevent exacerbations) -Controlled -Current treatment  Advair 250/50 mcg 2 puffs BID Albuterol HFA 74mcg prn -Medications previously tried: Trelegy   -Exacerbations requiring treatment in last 6 months: none -Patient reports consistent use of maintenance inhaler -Frequency of rescue inhaler use: during times of increased activity - a few times weekly -Counseled on Proper inhaler technique; Benefits of consistent maintenance inhaler use Differences between maintenance and rescue inhalers -Recommended to continue current medication  Hypothyroidism (Goal: Maintain TSH) -Controlled -Current treatment  Levothyroxine 50 mcg daily  -Medications previously tried: none noted -TSH WNL, takes medication appropriately  -Recommended to continue current medication  Patient Goals/Self-Care Activities Patient will:  - take medications as prescribed check blood pressure daily, document, and provide at future appointments engage in dietary modifications by limiting sweets/sugars  Follow Up Plan: The care management team will reach out to the patient again over the next 120 days.       Brenda Tucker was given information about Chronic Care Management services today including:  CCM service includes personalized support from designated clinical staff supervised by her physician, including individualized plan of care and coordination with other care providers 24/7 contact phone numbers for assistance for urgent and routine care needs. Standard  insurance, coinsurance, copays and deductibles apply for chronic care management only during months in which we provide at least 20 minutes of these services. Most insurances cover these services at 100%, however patients [place] be responsible for any copay, coinsurance and/or deductible if applicable. This service [place] help you avoid the need for more expensive face-to-face services. Only one practitioner La[place] furnish and bill the service in a calendar month. The patient [place] stop CCM services at any time (effective at the end of the month) by phone call to the office staff.  Patient agreed to services and verbal consent obtained.   The patient verbalized understanding of instructions, educational materials, and care plan provided today and agreed to receive a mailed copy of patient instructions, educational materials, and care plan.  Telephone follow up appointment with pharmacy team member scheduled for: 4 months  Edythe Clarity, [facility]

## 2020-12-15 NOTE — Telephone Encounter (Signed)
Spoke with pt she had enough losartan for now she will call us back and we can send losartan 100 mg  and also as per CCM phar discuss lipitor at next visit

## 2020-12-21 ENCOUNTER — Telehealth: Payer: Self-pay

## 2020-12-21 NOTE — Telephone Encounter (Signed)
-----   Message from Edythe Clarity, [facility] sent at 12/15/2020  3:51 PM EDT ----- Marykay Lex!  Mrs. Antosh requested we call in a rx to [facility] for 100mg  losartan tablets she is currently taking two of the 50mg  tabs.  Also, she is not tolerating her Lipitor every day and she is only willing to take every other day.  If Dr. Humphrey Rolls is ok with that (her LDL was well controlled) can we get a new rx called in for her to take it every day for insurance adherence tracking purposes?  Thanks!!

## 2020-12-21 NOTE — Telephone Encounter (Signed)
Spoke with chris from CCM that pt see cardiology and they are manage her med he is going to send message to cardiology

## 2020-12-24 DIAGNOSIS — J449 Chronic obstructive pulmonary disease, unspecified: Secondary | ICD-10-CM | POA: Diagnosis not present

## 2020-12-27 ENCOUNTER — Ambulatory Visit: Payer: PPO | Admitting: Nurse Practitioner

## 2020-12-30 ENCOUNTER — Ambulatory Visit: Payer: PPO | Admitting: Internal Medicine

## 2020-12-30 ENCOUNTER — Encounter: Payer: Self-pay | Admitting: Internal Medicine

## 2020-12-30 ENCOUNTER — Other Ambulatory Visit: Payer: Self-pay

## 2020-12-30 VITALS — BP 156/62 | HR 70 | Temp 98.0°F | Resp 16 | Ht <= 58 in | Wt 128.6 lb

## 2020-12-30 DIAGNOSIS — R0602 Shortness of breath: Secondary | ICD-10-CM | POA: Diagnosis not present

## 2020-12-30 DIAGNOSIS — G4733 Obstructive sleep apnea (adult) (pediatric): Secondary | ICD-10-CM | POA: Diagnosis not present

## 2020-12-30 DIAGNOSIS — I10 Essential (primary) hypertension: Secondary | ICD-10-CM | POA: Diagnosis not present

## 2020-12-30 DIAGNOSIS — K219 Gastro-esophageal reflux disease without esophagitis: Secondary | ICD-10-CM

## 2020-12-30 DIAGNOSIS — J449 Chronic obstructive pulmonary disease, unspecified: Secondary | ICD-10-CM | POA: Diagnosis not present

## 2020-12-30 NOTE — Progress Notes (Signed)
[facility] [place],  [id number]  Pulmonary Sleep Medicine   Office Visit Note  Patient Name: Brenda Tucker DOB: 1934/06/19 MRN [id number]  Date of Service: 12/30/2020  Complaints/HPI: COPD. She has not noted any worsening of her symptoms. Patient states that she has had denial of her oxygen by her inusrance but she does have some which she uses and gains benefit. She does have a cough which is basically dry. She has noted some wheeze at times. She has no chest pain. She does have chronic shoulder pain also  ROS  General: (-) fever, (-) chills, (-) night sweats, (-) weakness Skin: (-) rashes, (-) itching,. Eyes: (-) visual changes, (-) redness, (-) itching. Nose and Sinuses: (-) nasal stuffiness or itchiness, (-) postnasal drip, (-) nosebleeds, (-) sinus trouble. Mouth and Throat: (-) sore throat, (-) hoarseness. Neck: (-) swollen glands, (-) enlarged thyroid, (-) neck pain. Respiratory: + cough, (-) bloody sputum, + shortness of breath, - wheezing. Cardiovascular: - ankle swelling, (-) chest pain. Lymphatic: (-) lymph node enlargement. Neurologic: (-) numbness, (-) tingling. Psychiatric: (-) anxiety, (-) depression   Current Medication: Outpatient Encounter Medications as of 12/30/2020  Medication Sig   acetaminophen (TYLENOL) 500 MG tablet Take 500 mg by mouth every 6 (six) hours as needed for moderate pain or headache.   Albuterol Sulfate (PROAIR RESPICLICK) 235 (90 Base) MCG/ACT AEPB Inhale 2 puffs into the lungs every 4 (four) hours as needed.   aspirin EC 81 MG tablet Take 81 mg by mouth daily.   calcium carbonate (OSCAL) 1500 (600 Ca) MG TABS tablet Take 600 mg of elemental calcium by mouth daily.   Cholecalciferol (VITAMIN D) 50 MCG (2000 UT) tablet Take 2,000 Units by mouth daily.   fluticasone-salmeterol (ADVAIR DISKUS) 250-50 MCG/ACT AEPB INHALE 1 PUFF INTO THE LUNGS TWICE DAILY   furosemide (LASIX) 20 MG tablet Take 1 tablet (20 mg total)  by mouth daily as needed (for weight gain greater than 3 pounds overnight and 5 pounds in 1 week.).   latanoprost (XALATAN) 0.005 % ophthalmic solution Place 1 drop into both eyes at bedtime.   levothyroxine (SYNTHROID) 50 MCG tablet Take 1 tablet (50 mcg total) by mouth daily before breakfast.   losartan (COZAAR) 100 MG tablet Take 100 mg by mouth daily.   vitamin B-12 (CYANOCOBALAMIN) 1000 MCG tablet Take 1,000 mcg by mouth daily.   atorvastatin (LIPITOR) 10 MG tablet Take 1 tablet (10 mg total) by mouth daily.   carvedilol (COREG) 25 MG tablet Take 1 tablet (25 mg total) by mouth 2 (two) times daily.   [DISCONTINUED] Fluticasone-Salmeterol (ADVAIR) 500-50 MCG/DOSE AEPB Inhale 1 puff into the lungs 2 (two) times daily. (Patient not taking: Reported on 12/30/2020)   [DISCONTINUED] Magnesium Oxide 400 MG CAPS Take 1 capsule (400 mg total) by mouth daily. (Patient not taking: Reported on 12/30/2020)   No facility-administered encounter medications on file as of 12/30/2020.    Surgical History: Past Surgical History:  Procedure Laterality Date   ABDOMINAL HYSTERECTOMY     APPENDECTOMY     CHOLECYSTECTOMY N/A 03/12/2020   Procedure: LAPAROSCOPIC CHOLECYSTECTOMY;  Surgeon: Fredirick Maudlin, MD;  Location: [facility] ORS;  Service: General;  Laterality: N/A;   COLONOSCOPY WITH PROPOFOL N/A 07/09/2015   Procedure: COLONOSCOPY WITH PROPOFOL;  Surgeon: Manya Silvas, MD;  Location: [facility] ENDOSCOPY;  Service: Endoscopy;  Laterality: N/A;    Medical History: Past Medical History:  Diagnosis Date   Arthritis    Asthma  Cancer ([place])    skin, colon polyp   Chronic kidney disease    Complication of anesthesia    asthma attack per pt   COPD (chronic obstructive pulmonary disease) (HCC)    Dyspnea    Hypertension    Hypothyroidism     Family History: Family History  Problem Relation Age of Onset   Heart attack Father    Early death Sister        MVA   Stomach cancer Brother    Cancer Brother         shoulder   Lung cancer Brother    Cancer Sister    Cancer Sister    Early death Sister        husband killed her   Breast cancer Neg Hx     Social History: Social History   Socioeconomic History   Marital status: Widowed    Spouse name: Not on file   Number of children: Not on file   Years of education: Not on file   Highest education level: Not on file  Occupational History   Not on file  Tobacco Use   Smoking status: Never   Smokeless tobacco: Never   Tobacco comments:    second hand smoke  Vaping Use   Vaping Use: Never used  Substance and Sexual Activity   Alcohol use: No   Drug use: No   Sexual activity: Not on file  Other Topics Concern   Not on file  Social History Narrative   Not on file   Social Determinants of Health   Financial Resource Strain: Low Risk    Difficulty of Paying Living Expenses: Not very hard  Food Insecurity: Not on file  Transportation Needs: Not on file  Physical Activity: Not on file  Stress: Not on file  Social Connections: Not on file  Intimate Partner Violence: Not on file    Vital Signs: Blood pressure (!) 156/62, pulse 70, temperature 98 F (36.7 C), resp. rate 16, height 4\' 8"  (1.422 m), weight 128 lb 9.6 oz (58.3 kg), SpO2 96 %.  Examination: General Appearance: The patient is well-developed, well-nourished, and in no distress. Skin: Gross inspection of skin unremarkable. Head: normocephalic, no gross deformities. Eyes: no gross deformities noted. ENT: ears appear grossly normal no exudates. Neck: Supple. No thyromegaly. No LAD. Respiratory: no rhonchi noted today. Cardiovascular: Normal S1 and S2 without murmur or rub. Extremities: No cyanosis. pulses are equal. Neurologic: Alert and oriented. No involuntary movements.  LABS: No results found for this or any previous visit (from the past 2160 hour(s)).  Radiology: LONG TERM MONITOR (3-14 DAYS)  Result Date: 07/30/2020 Patch Wear Time:  13 days and 17  hours (2022-01-07T14:51:14-0500 to 2022-01-21T08:32:08-498) Patient had a min HR of 52 bpm, max HR of 162 bpm, and avg HR of 62 bpm. Predominant underlying rhythm was Sinus Rhythm. No evidence of atrial fibrillation or atrial flutter noted on cardiac monitor.   No results found.  No results found.    Assessment and Plan: Patient Active Problem List   Diagnosis Date Noted   Generalized abdominal pain 12/30/2019   Stage 3 chronic kidney disease ([place]) 06/30/2019   Urinary tract infection with hematuria 12/20/2018   Low back pain 12/20/2018   Bilateral carotid artery stenosis 06/19/2018   Cellulitis of finger of right hand 03/13/2018   Encounter for screening mammogram for malignant neoplasm of breast 12/18/2017   Vitamin D deficiency 12/18/2017   Dysuria 12/18/2017   Chronic obstructive pulmonary  disease ([place]) 08/17/2017   SOB (shortness of breath) 08/17/2017   GERD (gastroesophageal reflux disease) 08/17/2017   Hyperlipidemia 08/17/2017   Hypertension 08/17/2017   Hypothyroidism 08/17/2017   Osteoarthritis 08/17/2017    1. Shortness of breath  - Spirometry with Graph  2. Chronic obstructive pulmonary disease, unspecified COPD type ([place]) On inhlaers will be continued. She does have baseline SOB for which she needs to use her inhlaers as needed. Reviewed PFT and management with her today  3. OSA (obstructive sleep apnea) On oxygen will not use CPAP  4. GERD without esophagitis PPI as needed   General Counseling: I have discussed the findings of the evaluation and examination with [facility].  I have also discussed any further diagnostic evaluation thatmay be needed or ordered today. Anishka verbalizes understanding of the findings of todays visit. We also reviewed her medications today and discussed drug interactions and side effects including but not limited excessive drowsiness and altered mental states. We also discussed that there is always a risk not just to her but also people  around her. she has been encouraged to call the office with any questions or concerns that should arise related to todays visit.  Orders Placed This Encounter  Procedures   Spirometry with Graph    Order Specific Question:   Where should this test be performed?    Answer:   [facility]    Order Specific Question:   Basic spirometry    Answer:   Yes     Time spent: 32  I have personally obtained a history, examined the patient, evaluated laboratory and imaging results, formulated the assessment and plan and placed orders.    Allyne Gee, MD [facility] Pulmonary and Critical Care Sleep medicine

## 2020-12-31 ENCOUNTER — Ambulatory Visit (INDEPENDENT_AMBULATORY_CARE_PROVIDER_SITE_OTHER): Payer: PPO | Admitting: Physician Assistant

## 2020-12-31 ENCOUNTER — Encounter: Payer: Self-pay | Admitting: Physician Assistant

## 2020-12-31 DIAGNOSIS — J449 Chronic obstructive pulmonary disease, unspecified: Secondary | ICD-10-CM

## 2020-12-31 DIAGNOSIS — I10 Essential (primary) hypertension: Secondary | ICD-10-CM | POA: Diagnosis not present

## 2020-12-31 DIAGNOSIS — N1832 Chronic kidney disease, stage 3b: Secondary | ICD-10-CM | POA: Diagnosis not present

## 2020-12-31 DIAGNOSIS — E782 Mixed hyperlipidemia: Secondary | ICD-10-CM

## 2020-12-31 DIAGNOSIS — R300 Dysuria: Secondary | ICD-10-CM | POA: Diagnosis not present

## 2020-12-31 DIAGNOSIS — Z0001 Encounter for general adult medical examination with abnormal findings: Secondary | ICD-10-CM | POA: Diagnosis not present

## 2020-12-31 MED ORDER — AMLODIPINE BESYLATE 5 MG PO TABS: 5.0000 mg | ORAL_TABLET | Freq: Every day | ORAL | 1 refills | Status: DC

## 2020-12-31 NOTE — Progress Notes (Addendum)
[facility] [place], [place] [address]  Internal MEDICINE  Office Visit Note  Patient Name: Brenda Tucker  [id number]  [id number]  Date of Service: 01/06/2021  Chief Complaint  Patient presents with   Medicare Wellness    Discuss legs, left leg feels light pt has a tight wrap on it, right shoulder hurts sometimes   Hypertension   Asthma   COPD   Quality Metric Gaps    shingrix     HPI Pt is here for routine health maintenance examination -Bp at home 139/66 at home this morning, but does fluctuate and was elevated in office yesterday for pulm visit.  -takes Lasix only as needed when LE swelling -Breathing is ok, saw pulmonology yesterday and thinks breathing is a little worse -Wearing oxygen at night now, declined CPAP despite severe OSA on sleep study. Pt understands the consequences of untreated sleep apnea, but still declines -Goes to see kidney doctor in August and thinks they are doing more lab work then and had other labs done in January therefore will hold off on ordering any blood work at this time -Used to be on amlodipine and unsure why this was stopped and BP is high in office  Current Medication: Outpatient Encounter Medications as of 12/31/2020  Medication Sig   acetaminophen (TYLENOL) 500 MG tablet Take 500 mg by mouth every 6 (six) hours as needed for moderate pain or headache.   Albuterol Sulfate (PROAIR RESPICLICK) 366 (90 Base) MCG/ACT AEPB Inhale 2 puffs into the lungs every 4 (four) hours as needed.   amLODipine (NORVASC) 5 MG tablet Take 1 tablet (5 mg total) by mouth daily.   aspirin EC 81 MG tablet Take 81 mg by mouth daily.   calcium carbonate (OSCAL) 1500 (600 Ca) MG TABS tablet Take 600 mg of elemental calcium by mouth daily.   Cholecalciferol (VITAMIN D) 50 MCG (2000 UT) tablet Take 2,000 Units by mouth daily.   fluticasone-salmeterol (ADVAIR DISKUS) 250-50 MCG/ACT AEPB INHALE 1 PUFF INTO THE LUNGS TWICE DAILY   furosemide  (LASIX) 20 MG tablet Take 1 tablet (20 mg total) by mouth daily as needed (for weight gain greater than 3 pounds overnight and 5 pounds in 1 week.).   latanoprost (XALATAN) 0.005 % ophthalmic solution Place 1 drop into both eyes at bedtime.   levothyroxine (SYNTHROID) 50 MCG tablet Take 1 tablet (50 mcg total) by mouth daily before breakfast.   losartan (COZAAR) 100 MG tablet Take 100 mg by mouth daily.   vitamin B-12 (CYANOCOBALAMIN) 1000 MCG tablet Take 1,000 mcg by mouth daily.   atorvastatin (LIPITOR) 10 MG tablet Take 1 tablet (10 mg total) by mouth daily.   carvedilol (COREG) 25 MG tablet Take 1 tablet (25 mg total) by mouth 2 (two) times daily.   No facility-administered encounter medications on file as of 12/31/2020.    Surgical History: Past Surgical History:  Procedure Laterality Date   ABDOMINAL HYSTERECTOMY     APPENDECTOMY     CHOLECYSTECTOMY N/A 03/12/2020   Procedure: LAPAROSCOPIC CHOLECYSTECTOMY;  Surgeon: Fredirick Maudlin, MD;  Location: [facility] ORS;  Service: General;  Laterality: N/A;   COLONOSCOPY WITH PROPOFOL N/A 07/09/2015   Procedure: COLONOSCOPY WITH PROPOFOL;  Surgeon: Manya Silvas, MD;  Location: [facility] ENDOSCOPY;  Service: Endoscopy;  Laterality: N/A;    Medical History: Past Medical History:  Diagnosis Date   Arthritis    Asthma    Cancer ([place])    skin, colon polyp   Chronic  kidney disease    Complication of anesthesia    asthma attack per pt   COPD (chronic obstructive pulmonary disease) (HCC)    Dyspnea    Hypertension    Hypothyroidism     Family History: Family History  Problem Relation Age of Onset   Heart attack Father    Early death Sister        MVA   Stomach cancer Brother    Cancer Brother        shoulder   Lung cancer Brother    Cancer Sister    Cancer Sister    Early death Sister        husband killed her   Breast cancer Neg Hx       Review of Systems  Constitutional:  Negative for chills, diaphoresis and fatigue.  HENT:   Negative for ear pain, postnasal drip and sinus pressure.   Eyes:  Negative for photophobia, discharge, redness, itching and visual disturbance.  Respiratory:  Positive for shortness of breath. Negative for cough and wheezing.   Cardiovascular:  Negative for chest pain, palpitations and leg swelling.  Gastrointestinal:  Negative for abdominal pain, constipation, diarrhea, nausea and vomiting.  Genitourinary:  Negative for dysuria and flank pain.  Musculoskeletal:  Positive for arthralgias. Negative for back pain, gait problem and neck pain.  Skin:  Negative for color change.  Allergic/Immunologic: Negative for environmental allergies and food allergies.  Neurological:  Negative for dizziness and headaches.  Hematological:  Does not bruise/bleed easily.  Psychiatric/Behavioral:  Negative for agitation, behavioral problems (depression) and hallucinations.     Vital Signs: BP 140/80 Comment: 172/80  Pulse 65   Temp 98.6 F (37 C)   Resp 16   Ht 4\' 8"  (1.422 m)   Wt 127 lb 12.8 oz (58 kg)   SpO2 94%   BMI 28.65 kg/m    Physical Exam Vitals and nursing note reviewed.  Constitutional:      General: She is not in acute distress.    Appearance: She is well-developed. She is not diaphoretic.  HENT:     Head: Normocephalic and atraumatic.     Right Ear: External ear normal.     Left Ear: External ear normal.     Nose: Nose normal.     Mouth/Throat:     Pharynx: No oropharyngeal exudate.  Eyes:     General: No scleral icterus.       Right eye: No discharge.        Left eye: No discharge.     Conjunctiva/sclera: Conjunctivae normal.     Pupils: Pupils are equal, round, and reactive to light.  Neck:     Thyroid: No thyromegaly.     Vascular: No JVD.     Trachea: No tracheal deviation.  Cardiovascular:     Rate and Rhythm: Normal rate and regular rhythm.     Heart sounds: Normal heart sounds. No murmur heard.   No friction rub. No gallop.  Pulmonary:     Effort: Pulmonary  effort is normal. No respiratory distress.     Breath sounds: Normal breath sounds. No stridor. No wheezing or rales.  Chest:     Chest wall: No tenderness.  Abdominal:     General: Bowel sounds are normal. There is no distension.     Palpations: Abdomen is soft. There is no mass.     Tenderness: There is no abdominal tenderness. There is no guarding or rebound.  Musculoskeletal:  General: No tenderness or deformity. Normal range of motion.     Cervical back: Normal range of motion and neck supple.  Lymphadenopathy:     Cervical: No cervical adenopathy.  Skin:    General: Skin is warm and dry.     Coloration: Skin is not pale.     Findings: No erythema or rash.  Neurological:     Mental Status: She is alert.     Cranial Nerves: No cranial nerve deficit.     Motor: No abnormal muscle tone.     Coordination: Coordination normal.     Deep Tendon Reflexes: Reflexes are normal and symmetric.  Psychiatric:        Behavior: Behavior normal.        Thought Content: Thought content normal.        Judgment: Judgment normal.     LABS: Recent Results (from the past 2160 hour(s))  UA/M w/rflx Culture, Routine     Status: Abnormal   Collection Time: 12/31/20  3:08 PM   Specimen: Urine   Urine  Result Value Ref Range   Specific Gravity, UA 1.019 1.005 - 1.030   pH, UA 5.5 5.0 - 7.5   Color, UA Yellow Yellow   Appearance Ur Cloudy (A) Clear   Leukocytes,UA 3+ (A) Negative   Protein,UA 2+ (A) Negative/Trace   Glucose, UA Negative Negative   Ketones, UA Trace (A) Negative   RBC, UA Negative Negative   Bilirubin, UA Negative Negative   Urobilinogen, Ur 0.2 0.2 - 1.0 mg/dL   Nitrite, UA Negative Negative   Microscopic Examination See below:     Comment: Microscopic was indicated and was performed.   Urinalysis Reflex Comment     Comment: This specimen has reflexed to a Urine Culture.  Microscopic Examination     Status: Abnormal   Collection Time: 12/31/20  3:08 PM   Urine   Result Value Ref Range   WBC, UA >30 (A) 0 - 5 /hpf   RBC 0-2 0 - 2 /hpf   Epithelial Cells (non renal) 0-10 0 - 10 /hpf   Casts None seen None seen /lpf   Bacteria, UA None seen None seen/Few  Urine Culture, Reflex     Status: None   Collection Time: 12/31/20  3:08 PM   Urine  Result Value Ref Range   Urine Culture, Routine Final report    Organism ID, Bacteria No growth         Assessment/Plan: 1. Encounter for general adult medical examination with abnormal findings CPE performed  2. Essential hypertension Will continue losartan 100mg  daily, Coreg BID and add amlodipine 5 mg. Pt will monitor closely - amLODipine (NORVASC) 5 MG tablet; Take 1 tablet (5 mg total) by mouth daily.  Dispense: 90 tablet; Refill: 1  3. Chronic obstructive pulmonary disease, unspecified COPD type ([place]) Followed by pulmonology, on oxygen at night  4. Stage 3b chronic kidney disease ([place]) Followed by nephrology with plan for repeat labs at next visit  5. Mixed hyperlipidemia Continue lipitor qod  6. Dysuria - UA/M w/rflx Culture, Routine   General Counseling: Hien verbalizes understanding of the findings of todays visit and agrees with plan of treatment. I have discussed any further diagnostic evaluation that [place]low be needed or ordered today. We also reviewed her medications today. she has been encouraged to call the office with any questions or concerns that should arise related to todays visit.    Counseling:    Orders Placed This Encounter  Procedures   Microscopic Examination   Urine Culture, Reflex   UA/M w/rflx Culture, Routine    Meds ordered this encounter  Medications   amLODipine (NORVASC) 5 MG tablet    Sig: Take 1 tablet (5 mg total) by mouth daily.    Dispense:  90 tablet    Refill:  1    This patient was seen by Drema Dallas, PA-C in collaboration with Dr. Clayborn Bigness as a part of collaborative care agreement.  Total time spent:35 Minutes  Time spent  includes review of chart, medications, test results, and follow up plan with the patient.     Lavera Guise, MD  Internal Medicine

## 2021-01-04 LAB — URINE CULTURE, REFLEX: Organism ID, Bacteria: NO GROWTH

## 2021-01-04 LAB — UA/M W/RFLX CULTURE, ROUTINE
Bilirubin, UA: NEGATIVE
Glucose, UA: NEGATIVE
Nitrite, UA: NEGATIVE
RBC, UA: NEGATIVE
Specific Gravity, UA: 1.019 (ref 1.005–1.030)
Urobilinogen, Ur: 0.2 mg/dL (ref 0.2–1.0)
pH, UA: 5.5 (ref 5.0–7.5)

## 2021-01-04 LAB — MICROSCOPIC EXAMINATION
Bacteria, UA: NONE SEEN
Casts: NONE SEEN /lpf
WBC, UA: >30 /hpf — AB (ref 0–5)

## 2021-01-06 ENCOUNTER — Encounter: Payer: Self-pay | Admitting: Physician Assistant

## 2021-01-10 DIAGNOSIS — H40023 Open angle with borderline findings, high risk, bilateral: Secondary | ICD-10-CM | POA: Diagnosis not present

## 2021-01-23 DIAGNOSIS — J449 Chronic obstructive pulmonary disease, unspecified: Secondary | ICD-10-CM | POA: Diagnosis not present

## 2021-01-26 ENCOUNTER — Encounter (INDEPENDENT_AMBULATORY_CARE_PROVIDER_SITE_OTHER): Payer: Self-pay

## 2021-01-26 ENCOUNTER — Ambulatory Visit: Payer: PPO | Admitting: Internal Medicine

## 2021-01-26 ENCOUNTER — Telehealth: Payer: Self-pay

## 2021-01-26 ENCOUNTER — Other Ambulatory Visit: Payer: Self-pay

## 2021-01-26 DIAGNOSIS — R0602 Shortness of breath: Secondary | ICD-10-CM | POA: Diagnosis not present

## 2021-01-26 LAB — PULMONARY FUNCTION TEST

## 2021-01-26 NOTE — Telephone Encounter (Signed)
Pt called back and advised that she uses [facility] for her oxygen.  She only uses oxygen at night.  She had a sleep study done 07/15/20 and she advised that is when she was told she needed oxygen.  Pt dropped off a statement from her insurance [facility]) and they have denied her coverage (payment ) for her oxygen she stated 3 different times.  I told pt we [place] have to do other testing in order for them to cover her oxygen but I would get back with her to let her know what needs to be done.  I also LMOM for Claiborne Billings with [facility] to see why pt's oxygen is being denied.

## 2021-01-26 NOTE — Telephone Encounter (Signed)
LMOM to ask pt about her oxygen if using and who she gets oxygen from.  Told that she will need to do a re-cert and a 6 min walk.  LMOM for daughter also

## 2021-01-28 ENCOUNTER — Ambulatory Visit (INDEPENDENT_AMBULATORY_CARE_PROVIDER_SITE_OTHER): Payer: PPO

## 2021-01-28 ENCOUNTER — Encounter (INDEPENDENT_AMBULATORY_CARE_PROVIDER_SITE_OTHER): Payer: Self-pay

## 2021-01-28 ENCOUNTER — Other Ambulatory Visit: Payer: Self-pay

## 2021-01-28 VITALS — BP 152/76 | HR 70 | Resp 16 | Ht <= 58 in | Wt 128.0 lb

## 2021-01-28 DIAGNOSIS — R0602 Shortness of breath: Secondary | ICD-10-CM | POA: Diagnosis not present

## 2021-01-28 NOTE — Procedures (Signed)
[facility] [address] [place], [address]    Complete Pulmonary Function Testing Interpretation:  FINDINGS:  Forced vital capacity is mildly decreased.  FEV1 is 0.65 L which is 48% of predicted and is severely decreased.  Postbronchodilator there is no significant change in the FEV1 clinical improvement Capriotti occur in the absence of spirometric improvement.  Total lung capacity is increased residual volume is increased with interval internal capacity ratio is increased FRC is increased  IMPRESSION:  This pulmonary function study is consistent with severe obstructive lung disease  Allyne Gee, MD [facility] Pulmonary Critical Care Medicine Sleep Medicine

## 2021-01-31 DIAGNOSIS — I129 Hypertensive chronic kidney disease with stage 1 through stage 4 chronic kidney disease, or unspecified chronic kidney disease: Secondary | ICD-10-CM | POA: Diagnosis not present

## 2021-01-31 DIAGNOSIS — R319 Hematuria, unspecified: Secondary | ICD-10-CM | POA: Diagnosis not present

## 2021-01-31 DIAGNOSIS — N1832 Chronic kidney disease, stage 3b: Secondary | ICD-10-CM | POA: Diagnosis not present

## 2021-02-04 ENCOUNTER — Telehealth: Payer: Self-pay

## 2021-02-04 ENCOUNTER — Other Ambulatory Visit: Payer: Self-pay | Admitting: *Deleted

## 2021-02-04 NOTE — Telephone Encounter (Signed)
Send message to desha 

## 2021-02-04 NOTE — Telephone Encounter (Signed)
Left message with Claiborne Billings from [facility] to call me back about pt, she wants to do the Cpap now so she can keep her oxygen.  Pt didn't qualify for it with 6 min walk

## 2021-02-04 NOTE — Telephone Encounter (Signed)
Spoke to [place] with [facility] and she advised that pt needs to have a sleep titration to see if she still needs her oxygen.  Pt done a 6 minute walk and her oxygen didn't drop so she didn't qualify to keep oxygen.  Sent message to Vivien Rota to get pt set up with appt with DSK

## 2021-02-04 NOTE — Telephone Encounter (Signed)
Left vm for patient to return call to schedule appointment with dsk-Toni

## 2021-02-07 ENCOUNTER — Encounter: Payer: Self-pay | Admitting: Internal Medicine

## 2021-02-07 ENCOUNTER — Telehealth: Payer: Self-pay

## 2021-02-07 ENCOUNTER — Other Ambulatory Visit: Payer: Self-pay

## 2021-02-07 ENCOUNTER — Ambulatory Visit: Payer: PPO | Admitting: Internal Medicine

## 2021-02-07 VITALS — BP 148/70 | HR 64 | Temp 98.7°F | Resp 16 | Ht <= 58 in | Wt 131.6 lb

## 2021-02-07 DIAGNOSIS — G4733 Obstructive sleep apnea (adult) (pediatric): Secondary | ICD-10-CM | POA: Diagnosis not present

## 2021-02-07 DIAGNOSIS — R319 Hematuria, unspecified: Secondary | ICD-10-CM | POA: Diagnosis not present

## 2021-02-07 DIAGNOSIS — J449 Chronic obstructive pulmonary disease, unspecified: Secondary | ICD-10-CM

## 2021-02-07 DIAGNOSIS — R809 Proteinuria, unspecified: Secondary | ICD-10-CM | POA: Diagnosis not present

## 2021-02-07 DIAGNOSIS — N1832 Chronic kidney disease, stage 3b: Secondary | ICD-10-CM

## 2021-02-07 DIAGNOSIS — I129 Hypertensive chronic kidney disease with stage 1 through stage 4 chronic kidney disease, or unspecified chronic kidney disease: Secondary | ICD-10-CM | POA: Diagnosis not present

## 2021-02-07 NOTE — Patient Instructions (Signed)
Sleep Apnea Sleep apnea affects breathing during sleep. It causes breathing to stop for 10 seconds or more, or to become shallow. People with sleep apnea usually snoreloudly. It can also increase the risk of: Heart attack. Stroke. Being very overweight (obese). Diabetes. Heart failure. Irregular heartbeat. High blood pressure. The goal of treatment is to help you breathe normally again. What are the causes?  The most common cause of this condition is a collapsed or blocked airway. There are three kinds of sleep apnea: Obstructive sleep apnea. This is caused by a blocked or collapsed airway. Central sleep apnea. This happens when the brain does not send the right signals to the muscles that control breathing. Mixed sleep apnea. This is a combination of obstructive and central sleep apnea. What increases the risk? Being overweight. Smoking. Having a small airway. Being older. Being female. Drinking alcohol. Taking medicines to calm yourself (sedatives or tranquilizers). Having family members with the condition. Having a tongue or tonsils that are larger than normal. What are the signs or symptoms? Trouble staying asleep. Loud snoring. Headaches in the morning. Waking up gasping. Dry mouth or sore throat in the morning. Being sleepy or tired during the day. If you are sleepy or tired during the day, you Barna also: Not be able to focus your mind (concentrate). Forget things. Get angry a lot and have mood swings. Feel sad (depressed). Have changes in your personality. Have less interest in sex, if you are female. Be unable to have an erection, if you are female. How is this treated?  Sleeping on your side. Using a medicine to get rid of mucus in your nose (decongestant). Avoiding the use of alcohol, medicines to help you relax, or certain pain medicines (narcotics). Losing weight, if needed. Changing your diet. Quitting smoking. Using a machine to open your airway while you  sleep, such as: An oral appliance. This is a mouthpiece that shifts your lower jaw forward. A CPAP device. This device blows air through a mask when you breathe out (exhale). An EPAP device. This has valves that you put in each nostril. A BPAP device. This device blows air through a mask when you breathe in (inhale) and breathe out. Having surgery if other treatments do not work. Follow these instructions at home: Lifestyle Make changes that your doctor recommends. Eat a healthy diet. Lose weight if needed. Avoid alcohol, medicines to help you relax, and some pain medicines. Do not smoke or use any products that contain nicotine or tobacco. If you need help quitting, ask your doctor. General instructions Take over-the-counter and prescription medicines only as told by your doctor. If you were given a machine to use while you sleep, use it only as told by your doctor. If you are having surgery, make sure to tell your doctor you have sleep apnea. You Dilley need to bring your device with you. Keep all follow-up visits. Contact a doctor if: The machine that you were given to use during sleep bothers you or does not seem to be working. You do not get better. You get worse. Get help right away if: Your chest hurts. You have trouble breathing in enough air. You have an uncomfortable feeling in your back, arms, or stomach. You have trouble talking. One side of your body feels weak. A part of your face is hanging down. These symptoms Rocha be an emergency. Get help right away. Call your local emergency services (911 in the [place] Do not wait to see if the symptoms   will go away. Do not drive yourself to the hospital. Summary This condition affects breathing during sleep. The most common cause is a collapsed or blocked airway. The goal of treatment is to help you breathe normally while you sleep. This information is not intended to replace advice given to you by your health care provider. Make  sure you discuss any questions you have with your healthcare provider. Document Revised: 05/28/2020 Document Reviewed: 05/28/2020 Elsevier Patient Education  2022 [employer]  

## 2021-02-07 NOTE — Telephone Encounter (Signed)
CPAP (AUTO TITRATING) Order sent to [facility] with signed order and notes.

## 2021-02-07 NOTE — Progress Notes (Signed)
[facility] [place], [place] [address]  Pulmonary Sleep Medicine   Office Visit Note  Patient Name: Brenda Tucker DOB: [birth year] MRN [id number]  Date of Service: 02/07/2021  Complaints/HPI: OSA. She had severe sleep apnea based on her sleep study back in February. Her AHI was 32.9 pr hour. She also has been told she has heart failure also. Patient has recently noted increased shortness of breath. She also has severe COPD. She did not smoke but worked in [employer] and also was exposed to second hand smoke.  ROS  General: (-) fever, (-) chills, (-) night sweats, (-) weakness Skin: (-) rashes, (-) itching,. Eyes: (-) visual changes, (-) redness, (-) itching. Nose and Sinuses: (-) nasal stuffiness or itchiness, (-) postnasal drip, (-) nosebleeds, (-) sinus trouble. Mouth and Throat: (-) sore throat, (-) hoarseness. Neck: (-) swollen glands, (-) enlarged thyroid, (-) neck pain. Respiratory: - cough, (-) bloody sputum, + shortness of breath, - wheezing. Cardiovascular: - ankle swelling, (-) chest pain. Lymphatic: (-) lymph node enlargement. Neurologic: (-) numbness, (-) tingling. Psychiatric: (-) anxiety, (-) depression   Current Medication: Outpatient Encounter Medications as of 02/07/2021  Medication Sig   acetaminophen (TYLENOL) 500 MG tablet Take 500 mg by mouth every 6 (six) hours as needed for moderate pain or headache.   Albuterol Sulfate (PROAIR RESPICLICK) 073 (90 Base) MCG/ACT AEPB Inhale 2 puffs into the lungs every 4 (four) hours as needed.   amLODipine (NORVASC) 5 MG tablet Take 1 tablet (5 mg total) by mouth daily.   aspirin EC 81 MG tablet Take 81 mg by mouth daily.   calcium carbonate (OSCAL) 1500 (600 Ca) MG TABS tablet Take 600 mg of elemental calcium by mouth daily.   Cholecalciferol (VITAMIN D) 50 MCG (2000 UT) tablet Take 2,000 Units by mouth daily.   fluticasone-salmeterol (ADVAIR DISKUS) 250-50 MCG/ACT AEPB INHALE 1 PUFF INTO THE  LUNGS TWICE DAILY   furosemide (LASIX) 20 MG tablet Take 1 tablet (20 mg total) by mouth daily as needed (for weight gain greater than 3 pounds overnight and 5 pounds in 1 week.).   latanoprost (XALATAN) 0.005 % ophthalmic solution Place 1 drop into both eyes at bedtime.   levothyroxine (SYNTHROID) 50 MCG tablet Take 1 tablet (50 mcg total) by mouth daily before breakfast.   losartan (COZAAR) 100 MG tablet Take 100 mg by mouth daily.   vitamin B-12 (CYANOCOBALAMIN) 1000 MCG tablet Take 1,000 mcg by mouth daily.   atorvastatin (LIPITOR) 10 MG tablet Take 1 tablet (10 mg total) by mouth daily.   carvedilol (COREG) 25 MG tablet Take 1 tablet (25 mg total) by mouth 2 (two) times daily.   No facility-administered encounter medications on file as of 02/07/2021.    Surgical History: Past Surgical History:  Procedure Laterality Date   ABDOMINAL HYSTERECTOMY     APPENDECTOMY     CHOLECYSTECTOMY N/A 03/12/2020   Procedure: LAPAROSCOPIC CHOLECYSTECTOMY;  Surgeon: Fredirick Maudlin, MD;  Location: [facility] ORS;  Service: General;  Laterality: N/A;   COLONOSCOPY WITH PROPOFOL N/A 07/09/2015   Procedure: COLONOSCOPY WITH PROPOFOL;  Surgeon: Manya Silvas, MD;  Location: [facility] ENDOSCOPY;  Service: Endoscopy;  Laterality: N/A;    Medical History: Past Medical History:  Diagnosis Date   Arthritis    Asthma    Cancer ([place])    skin, colon polyp   Chronic kidney disease    Complication of anesthesia    asthma attack per pt   COPD (chronic obstructive pulmonary  disease) ([place])    Dyspnea    Hypertension    Hypothyroidism     Family History: Family History  Problem Relation Age of Onset   Heart attack Father    Early death Sister        MVA   Stomach cancer Brother    Cancer Brother        shoulder   Lung cancer Brother    Cancer Sister    Cancer Sister    Early death Sister        husband killed her   Breast cancer Neg Hx     Social History: Social History   Socioeconomic History    Marital status: Widowed    Spouse name: Not on file   Number of children: Not on file   Years of education: Not on file   Highest education level: Not on file  Occupational History   Not on file  Tobacco Use   Smoking status: Never   Smokeless tobacco: Never   Tobacco comments:    second hand smoke  Vaping Use   Vaping Use: Never used  Substance and Sexual Activity   Alcohol use: No   Drug use: No   Sexual activity: Not on file  Other Topics Concern   Not on file  Social History Narrative   Not on file   Social Determinants of Health   Financial Resource Strain: Low Risk    Difficulty of Paying Living Expenses: Not very hard  Food Insecurity: Not on file  Transportation Needs: Not on file  Physical Activity: Not on file  Stress: Not on file  Social Connections: Not on file  Intimate Partner Violence: Not on file    Vital Signs: Blood pressure (!) 148/70, pulse 64, temperature 98.7 F (37.1 C), resp. rate 16, height 4\' 8"  (1.422 m), weight 131 lb 9.6 oz (59.7 kg), SpO2 97 %.  Examination: General Appearance: The patient is well-developed, well-nourished, and in no distress. Skin: Gross inspection of skin unremarkable. Head: normocephalic, no gross deformities. Eyes: no gross deformities noted. ENT: ears appear grossly normal no exudates. Neck: Supple. No thyromegaly. No LAD. Respiratory: distant BS no rhonchi noted. Cardiovascular: Normal S1 and S2 without murmur or rub. Extremities: No cyanosis. pulses are equal. Neurologic: Alert and oriented. No involuntary movements.  LABS: Recent Results (from the past 2160 hour(s))  UA/M w/rflx Culture, Routine     Status: Abnormal   Collection Time: 12/31/20  3:08 PM   Specimen: Urine   Urine  Result Value Ref Range   Specific Gravity, UA 1.019 1.005 - 1.030   pH, UA 5.5 5.0 - 7.5   Color, UA Yellow Yellow   Appearance Ur Cloudy (A) Clear   Leukocytes,UA 3+ (A) Negative   Protein,UA 2+ (A) Negative/Trace    Glucose, UA Negative Negative   Ketones, UA Trace (A) Negative   RBC, UA Negative Negative   Bilirubin, UA Negative Negative   Urobilinogen, Ur 0.2 0.2 - 1.0 mg/dL   Nitrite, UA Negative Negative   Microscopic Examination See below:     Comment: Microscopic was indicated and was performed.   Urinalysis Reflex Comment     Comment: This specimen has reflexed to a Urine Culture.  Microscopic Examination     Status: Abnormal   Collection Time: 12/31/20  3:08 PM   Urine  Result Value Ref Range   WBC, UA >30 (A) 0 - 5 /hpf   RBC 0-2 0 - 2 /hpf   Epithelial  Cells (non renal) 0-10 0 - 10 /hpf   Casts None seen None seen /lpf   Bacteria, UA None seen None seen/Few  Urine Culture, Reflex     Status: None   Collection Time: 12/31/20  3:08 PM   Urine  Result Value Ref Range   Urine Culture, Routine Final report    Organism ID, Bacteria No growth     Radiology: LONG TERM MONITOR (3-14 DAYS)  Result Date: 07/30/2020 Patch Wear Time:  13 days and 17 hours (2022-01-07T14:51:14-0500 to 2022-01-21T08:32:08-498) Patient had a min HR of 52 bpm, max HR of 162 bpm, and avg HR of 62 bpm. Predominant underlying rhythm was Sinus Rhythm. No evidence of atrial fibrillation or atrial flutter noted on cardiac monitor.   No results found.  No results found.    Assessment and Plan: Patient Active Problem List   Diagnosis Date Noted   Generalized abdominal pain 12/30/2019   Stage 3 chronic kidney disease ([place]) 06/30/2019   Urinary tract infection with hematuria 12/20/2018   Low back pain 12/20/2018   Bilateral carotid artery stenosis 06/19/2018   Cellulitis of finger of right hand 03/13/2018   Encounter for screening mammogram for malignant neoplasm of breast 12/18/2017   Vitamin D deficiency 12/18/2017   Dysuria 12/18/2017   Chronic obstructive pulmonary disease ([place]) 08/17/2017   SOB (shortness of breath) 08/17/2017   GERD (gastroesophageal reflux disease) 08/17/2017   Hyperlipidemia  08/17/2017   Hypertension 08/17/2017   Hypothyroidism 08/17/2017   Osteoarthritis 08/17/2017    1. Chronic obstructive pulmonary disease, unspecified COPD type ([place]) On inhalers advair and proair. Will continue she has severe COPD and discussed this at length with her  2. Stage 3b chronic kidney disease [facility]) Seeing nephrology has been stable  3. OSA (obstructive sleep apnea) Will now go on CPAP - For home use only DME continuous positive airway pressure (CPAP)   General Counseling: I have discussed the findings of the evaluation and examination with [facility].  I have also discussed any further diagnostic evaluation thatmay be needed or ordered today. Maryna verbalizes understanding of the findings of todays visit. We also reviewed her medications today and discussed drug interactions and side effects including but not limited excessive drowsiness and altered mental states. We also discussed that there is always a risk not just to her but also people around her. she has been encouraged to call the office with any questions or concerns that should arise related to todays visit.  No orders of the defined types were placed in this encounter.    Time spent: 78  I have personally obtained a history, examined the patient, evaluated laboratory and imaging results, formulated the assessment and plan and placed orders.    Allyne Gee, MD [facility] Pulmonary and Critical Care Sleep medicine

## 2021-02-10 ENCOUNTER — Encounter: Payer: Self-pay | Admitting: Cardiology

## 2021-02-10 ENCOUNTER — Ambulatory Visit: Payer: PPO | Admitting: Cardiology

## 2021-02-10 ENCOUNTER — Other Ambulatory Visit: Payer: Self-pay

## 2021-02-10 VITALS — BP 150/62 | HR 61 | Ht <= 58 in | Wt 129.0 lb

## 2021-02-10 DIAGNOSIS — R0600 Dyspnea, unspecified: Secondary | ICD-10-CM

## 2021-02-10 DIAGNOSIS — I350 Nonrheumatic aortic (valve) stenosis: Secondary | ICD-10-CM

## 2021-02-10 DIAGNOSIS — I10 Essential (primary) hypertension: Secondary | ICD-10-CM | POA: Diagnosis not present

## 2021-02-10 DIAGNOSIS — I5189 Other ill-defined heart diseases: Secondary | ICD-10-CM | POA: Diagnosis not present

## 2021-02-10 DIAGNOSIS — R0609 Other forms of dyspnea: Secondary | ICD-10-CM

## 2021-02-10 MED ORDER — LOSARTAN POTASSIUM 100 MG PO TABS: 100.0000 mg | ORAL_TABLET | Freq: Every day | ORAL | 3 refills | Status: DC

## 2021-02-10 NOTE — Progress Notes (Signed)
Cardiology Office Note:    Date:  02/10/2021   ID:  Brenda Tucker, DOB 20-Jun-1934, MRN [id number]  PCP:  Lavera Guise, MD  [facility] Cardiologist:  Kate Sable, MD  [place] Electrophysiologist:  None   Referring MD: Ronnell Freshwater, NP   Chief Complaint  Patient presents with   Other    6 month follow up -- Patient states she found out Monday that she had CHF and that is why she cant breathe. Meds reviewed verbally with patient.     History of Present Illness:    Brenda Tucker is a 87 y.o. female with a hx of hypertension, hyperlipidemia, CKD 3, COPD, never smoker who presents for follow-up.   Being seen due to diastolic dysfunction and hypertension.  Losartan started after last visit.  Also takes Coreg and amlodipine for BP control. Still has shortness of breath which is chronic. Recently diagnosed with severe OSA, waiting for her cpap machine. Takes lasix as needed for edema. Her weight has been stable   Prior notes Echo 76/28/3151 normal systolic function, grade 2 diastolic dysfunction, EF 55 to 60% Lexiscan Myoview 07/2020, no evidence for ischemia, coronary artery calcifications in the LAD, RCA. Outside echocardiogram on 01/2018 showed normal systolic function, EF 76%, aortic valve calcification. Stop Lipitor in the past due to diarrhea. Episode of A. fib in the context of Lexiscan usage.  Follow-up cardiac monitor with no recurrence of A. fib.  Past Medical History:  Diagnosis Date   Arthritis    Asthma    Cancer ([place])    skin, colon polyp   Chronic kidney disease    Complication of anesthesia    asthma attack per pt   COPD (chronic obstructive pulmonary disease) ([place])    Dyspnea    Hypertension    Hypothyroidism     Past Surgical History:  Procedure Laterality Date   ABDOMINAL HYSTERECTOMY     APPENDECTOMY     CHOLECYSTECTOMY N/A 03/12/2020   Procedure: LAPAROSCOPIC CHOLECYSTECTOMY;  Surgeon: Fredirick Maudlin, MD;  Location: [facility] ORS;  Service:  General;  Laterality: N/A;   COLONOSCOPY WITH PROPOFOL N/A 07/09/2015   Procedure: COLONOSCOPY WITH PROPOFOL;  Surgeon: Manya Silvas, MD;  Location: [place];  Service: Endoscopy;  Laterality: N/A;    Current Medications: Current Meds  Medication Sig   acetaminophen (TYLENOL) 500 MG tablet Take 500 mg by mouth every 6 (six) hours as needed for moderate pain or headache.   Albuterol Sulfate (PROAIR RESPICLICK) 160 (90 Base) MCG/ACT AEPB Inhale 2 puffs into the lungs every 4 (four) hours as needed.   amLODipine (NORVASC) 5 MG tablet Take 1 tablet (5 mg total) by mouth daily.   aspirin EC 81 MG tablet Take 81 mg by mouth daily.   atorvastatin (LIPITOR) 10 MG tablet Take 1 tablet (10 mg total) by mouth daily.   calcium carbonate (OSCAL) 1500 (600 Ca) MG TABS tablet Take 600 mg of elemental calcium by mouth daily.   carvedilol (COREG) 25 MG tablet Take 1 tablet (25 mg total) by mouth 2 (two) times daily.   Cholecalciferol (VITAMIN D) 50 MCG (2000 UT) tablet Take 2,000 Units by mouth daily.   fluticasone-salmeterol (ADVAIR DISKUS) 250-50 MCG/ACT AEPB INHALE 1 PUFF INTO THE LUNGS TWICE DAILY   furosemide (LASIX) 20 MG tablet Take 1 tablet (20 mg total) by mouth daily as needed (for weight gain greater than 3 pounds overnight and 5 pounds in 1 week.).   latanoprost (XALATAN) 0.005 %  ophthalmic solution Place 1 drop into both eyes at bedtime.   levothyroxine (SYNTHROID) 50 MCG tablet Take 1 tablet (50 mcg total) by mouth daily before breakfast.   losartan (COZAAR) 100 MG tablet Take 100 mg by mouth daily.   vitamin B-12 (CYANOCOBALAMIN) 1000 MCG tablet Take 1,000 mcg by mouth daily.     Allergies:   Theodrenaline   Social History   Socioeconomic History   Marital status: Widowed    Spouse name: Not on file   Number of children: Not on file   Years of education: Not on file   Highest education level: Not on file  Occupational History   Not on file  Tobacco Use   Smoking status:  Never   Smokeless tobacco: Never   Tobacco comments:    second hand smoke  Vaping Use   Vaping Use: Never used  Substance and Sexual Activity   Alcohol use: No   Drug use: No   Sexual activity: Not on file  Other Topics Concern   Not on file  Social History Narrative   Not on file   Social Determinants of Health   Financial Resource Strain: Low Risk    Difficulty of Paying Living Expenses: Not very hard  Food Insecurity: Not on file  Transportation Needs: Not on file  Physical Activity: Not on file  Stress: Not on file  Social Connections: Not on file     Family History: The patient's family history includes Cancer in her brother, sister, and sister; Early death in her sister and sister; Heart attack in her father; Lung cancer in her brother; Stomach cancer in her brother. There is no history of Breast cancer.  ROS:   Please see the history of present illness.     All other systems reviewed and are negative.  EKGs/Labs/Other Studies Reviewed:    The following studies were reviewed today:   EKG:  EKG not ordered today.    Recent Labs: 07/09/2020: Hemoglobin 12.5; Magnesium 1.4; Platelets 280; TSH 0.976 07/16/2020: ALT 14 07/30/2020: BUN 25; Creatinine, Ser 1.60; Potassium 4.8; Sodium 137  Recent Lipid Panel    Component Value Date/Time   CHOL 107 07/16/2020 1148   CHOL 212 (H) 12/31/2019 0853   TRIG 89 07/16/2020 1148   HDL 56 07/16/2020 1148   HDL 60 12/31/2019 0853   CHOLHDL 1.9 07/16/2020 1148   VLDL 18 07/16/2020 1148   LDLCALC 33 07/16/2020 1148   LDLCALC 130 (H) 12/31/2019 0853     Risk Assessment/Calculations:      Physical Exam:    VS:  BP (!) 150/62 (BP Location: Left Arm, Patient Position: Sitting, Cuff Size: Normal)   Pulse 61   Ht 4\' 8"  (1.422 m)   Wt 129 lb (58.5 kg)   SpO2 94%   BMI 28.92 kg/m     Wt Readings from Last 3 Encounters:  02/10/21 129 lb (58.5 kg)  02/07/21 131 lb 9.6 oz (59.7 kg)  01/28/21 128 lb (58.1 kg)     GEN:   Well nourished, well developed in no acute distress HEENT: Normal NECK: No JVD; No carotid bruits LYMPHATICS: No lymphadenopathy CARDIAC: RRR, 2/6 systolic murmur RESPIRATORY:  Clear to auscultation without rales, wheezing or rhonchi  ABDOMEN: Soft, non-tender, non-distended MUSCULOSKELETAL:  trace edema; No deformity  SKIN: Warm and dry NEUROLOGIC:  Alert and oriented x 3 PSYCHIATRIC:  Normal affect   ASSESSMENT:    1. Primary hypertension   2. Aortic valve stenosis, etiology of cardiac valve  disease unspecified   3. Diastolic dysfunction     PLAN:    In order of problems listed above:  History of hypertension, BP elevated.  CKD 3 patient.  Cont losartan, coreg, norvasc. Mild to moderate aortic valve stenosis,.  Plan to Monitor with serial echocardiograms. Trace to no le edema, grade 2 diastolic dysfunction, continue Lasix 20 mg daily as needed. Dyspnea on exertion, chronic problem. Etiology multifactorial including copd, osa, diastolic dysfunction. Cont management of G2DD as above, copd and osa as per pulm and sleep specialist.  Follow-up in 6 months   Shared Decision Making/Informed Consent      Medication Adjustments/Labs and Tests Ordered: Current medicines are reviewed at length with the patient today.  Concerns regarding medicines are outlined above.  Orders Placed This Encounter  Procedures   EKG 12-Lead    No orders of the defined types were placed in this encounter.   Patient Instructions  Medication Instructions:  Your physician recommends that you continue on your current medications as directed. Please refer to the Current Medication list given to you today.  *If you need a refill on your cardiac medications before your next appointment, please call your pharmacy*   Lab Work: None ordered If you have labs (blood work) drawn today and your tests are completely normal, you will receive your results only by: [place] (if you have MyChart) OR A  paper copy in the mail If you have any lab test that is abnormal or we need to change your treatment, we will call you to review the results.   Testing/Procedures: None ordered   Follow-Up: At [facility], you and your health needs are our priority.  As part of our continuing mission to provide you with exceptional heart care, we have created designated Provider Care Teams.  These Care Teams include your primary Cardiologist (physician) and Advanced Practice Providers (APPs -  Physician Assistants and Nurse Practitioners) who all work together to provide you with the care you need, when you need it.  We recommend signing up for the patient portal called "MyChart".  Sign up information is provided on this After Visit Summary.  MyChart is used to connect with patients for Virtual Visits (Telemedicine).  Patients are able to view lab/test results, encounter notes, upcoming appointments, etc.  Non-urgent messages can be sent to your provider as well.   To learn more about what you can do with MyChart, go to [email or web address].    Your next appointment:   6 month(s)  The format for your next appointment:   In Person  Provider:   You Pung see Kate Sable, MD or one of the following Advanced Practice Providers on your designated Care Team:   Murray Hodgkins, NP Christell Faith, PA-C Marrianne Mood, PA-C Cadence Smoaks, [place]   Other Instructions    Signed, Kate Sable, MD  02/10/2021 1:51 PM    [place]

## 2021-02-10 NOTE — Patient Instructions (Signed)
Medication Instructions:  Your physician recommends that you continue on your current medications as directed. Please refer to the Current Medication list given to you today.  *If you need a refill on your cardiac medications before your next appointment, please call your pharmacy*   Lab Work: None ordered If you have labs (blood work) drawn today and your tests are completely normal, you will receive your results only by: MyChart Message (if you have MyChart) OR A paper copy in the mail If you have any lab test that is abnormal or we need to change your treatment, we will call you to review the results.   Testing/Procedures: None ordered   Follow-Up: At [facility], you and your health needs are our priority.  As part of our continuing mission to provide you with exceptional heart care, we have created designated Provider Care Teams.  These Care Teams include your primary Cardiologist (physician) and Advanced Practice Providers (APPs -  Physician Assistants and Nurse Practitioners) who all work together to provide you with the care you need, when you need it.  We recommend signing up for the patient portal called "MyChart".  Sign up information is provided on this After Visit Summary.  MyChart is used to connect with patients for Virtual Visits (Telemedicine).  Patients are able to view lab/test results, encounter notes, upcoming appointments, etc.  Non-urgent messages can be sent to your provider as well.   To learn more about what you can do with MyChart, go to [email or web address].    Your next appointment:   6 month(s)  The format for your next appointment:   In Person  Provider:   You [place] see Brian Agbor-Etang, MD or one of the following Advanced Practice Providers on your designated Care Team:   Christopher Berge, NP Ryan Dunn, PA-C Jacquelyn Visser, PA-C Cadence Furth, PA-C   Other Instructions   

## 2021-02-11 ENCOUNTER — Telehealth: Payer: Self-pay | Admitting: Pharmacist

## 2021-02-11 NOTE — Progress Notes (Addendum)
Chronic Care Management Pharmacy Assistant   Name: Brenda Tucker  MRN: [id number] DOB: 10/20/1933  Reason for Encounter: Disease State For HTN.   Conditions to be addressed/monitored: HTN, COPD, GERD, Hypothyroidism, CKD, HLD  Recent office visits:  02/07/21 Dr. Humphrey Rolls For follow-up/COPD. No medication changes.  12/30/20 Dr. Humphrey Rolls For follow-up. STARTED Fluticasone-Salmeterol 250-50 MCG/ACT inhale 1 puff into lungs twice daily.   Recent consult visits:  02/10/21 Cardiology [place], Aaron Edelman For follow-up. No medication changes.   02/07/21 Nephrology For follow-up. Lavonia Dana, MD  No medication changes.  Hospital visits:  None since 12/15/20  Medications: Outpatient Encounter Medications as of 02/11/2021  Medication Sig   acetaminophen (TYLENOL) 500 MG tablet Take 500 mg by mouth every 6 (six) hours as needed for moderate pain or headache.   Albuterol Sulfate (PROAIR RESPICLICK) 151 (90 Base) MCG/ACT AEPB Inhale 2 puffs into the lungs every 4 (four) hours as needed.   amLODipine (NORVASC) 5 MG tablet Take 1 tablet (5 mg total) by mouth daily.   aspirin EC 81 MG tablet Take 81 mg by mouth daily.   atorvastatin (LIPITOR) 10 MG tablet Take 1 tablet (10 mg total) by mouth daily.   calcium carbonate (OSCAL) 1500 (600 Ca) MG TABS tablet Take 600 mg of elemental calcium by mouth daily.   carvedilol (COREG) 25 MG tablet Take 1 tablet (25 mg total) by mouth 2 (two) times daily.   Cholecalciferol (VITAMIN D) 50 MCG (2000 UT) tablet Take 2,000 Units by mouth daily.   fluticasone-salmeterol (ADVAIR DISKUS) 250-50 MCG/ACT AEPB INHALE 1 PUFF INTO THE LUNGS TWICE DAILY   furosemide (LASIX) 20 MG tablet Take 1 tablet (20 mg total) by mouth daily as needed (for weight gain greater than 3 pounds overnight and 5 pounds in 1 week.).   latanoprost (XALATAN) 0.005 % ophthalmic solution Place 1 drop into both eyes at bedtime.   levothyroxine (SYNTHROID) 50 MCG tablet Take 1 tablet (50 mcg total) by mouth  daily before breakfast.   losartan (COZAAR) 100 MG tablet Take 1 tablet (100 mg total) by mouth daily.   vitamin B-12 (CYANOCOBALAMIN) 1000 MCG tablet Take 1,000 mcg by mouth daily.   No facility-administered encounter medications on file as of 02/11/2021.   Reviewed chart prior to disease state call. Spoke with patient regarding BP  Recent Office Vitals: BP Readings from Last 3 Encounters:  02/10/21 (!) 150/62  02/07/21 (!) 148/70  01/28/21 (!) 152/76   Pulse Readings from Last 3 Encounters:  02/10/21 61  02/07/21 64  01/28/21 70    Wt Readings from Last 3 Encounters:  02/10/21 129 lb (58.5 kg)  02/07/21 131 lb 9.6 oz (59.7 kg)  01/28/21 128 lb (58.1 kg)     Kidney Function Lab Results  Component Value Date/Time   CREATININE 1.60 (H) 07/30/2020 12:55 PM   CREATININE 1.85 (H) 07/16/2020 11:48 AM   GFRNONAA 32 (L) 07/30/2020 12:55 PM   GFRAA 23 (L) 07/09/2020 02:52 PM    BMP Latest Ref Rng & Units 07/30/2020 07/16/2020 07/09/2020  Glucose 70 - 99 mg/dL 124(H) 157(H) 159(H)  BUN 8 - 23 mg/dL 25(H) 68(H) 67(H)  Creatinine 0.44 - 1.00 mg/dL 1.60(H) 1.85(H) 2.18(H)  BUN/Creat Ratio 12 - 28 - - 31(H)  Sodium 135 - 145 mmol/L 137 140 139  Potassium 3.5 - 5.1 mmol/L 4.8 4.0 4.2  Chloride 98 - 111 mmol/L 98 102 100  CO2 22 - 32 mmol/L 28 27 22   Calcium 8.9 - 10.3 mg/dL  9.6 9.6 10.0    Current antihypertensive regimen:  Losartan 100 mg 1 tablet daily Amlodipine 5 mg 1 tablet daily Carvedilol 25 mg 1 tablet twice daily   How often are you checking your Blood Pressure? Patient stated 3-5x per week  Current home BP readings: Patient stated her blood pressure ranges around 125-145/50-60.  What recent interventions/DTPs have been made by any provider to improve Blood Pressure control since last CPP Visit: None.  Any recent hospitalizations or ED visits since last visit with CPP? Patient stated no.  What diet changes have been made to improve Blood Pressure Control?  Patient  stated she eats pretty good, she stated she eats three times a day. She stated she eats vegetables and fruits. She stated she only drinks water.  What exercise is being done to improve your Blood Pressure Control?  Patient stated she hasn't been able to do much because of her shortness of breath. She stated she has recently been dx with CHF.   Adherence Review: Is the patient currently on ACE/ARB medication? Losartan 100 mg   Does the patient have >5 day gap between last estimated fill dates? Per misc rpts, no.  Star Rating Drugs: Losartan 100 mg 90 DS 01/10/21, Atorvastatin 10 mg 90 DS 11/10/20.  Patient stated she does not have any questions or concerns about medication changes.  Follow-Up:Pharmacist Review  Charlann Lange, RMA Clinical Pharmacist Assistant [phone]  10 minutes spent in review, coordination, and documentation.  Reviewed by: Beverly Milch, PharmD Clinical Pharmacist [phone]

## 2021-02-18 ENCOUNTER — Ambulatory Visit (INDEPENDENT_AMBULATORY_CARE_PROVIDER_SITE_OTHER): Payer: PPO | Admitting: Physician Assistant

## 2021-02-18 ENCOUNTER — Encounter: Payer: Self-pay | Admitting: Physician Assistant

## 2021-02-18 ENCOUNTER — Other Ambulatory Visit: Payer: Self-pay

## 2021-02-18 DIAGNOSIS — E039 Hypothyroidism, unspecified: Secondary | ICD-10-CM | POA: Diagnosis not present

## 2021-02-18 DIAGNOSIS — N1832 Chronic kidney disease, stage 3b: Secondary | ICD-10-CM

## 2021-02-18 DIAGNOSIS — E782 Mixed hyperlipidemia: Secondary | ICD-10-CM

## 2021-02-18 DIAGNOSIS — I10 Essential (primary) hypertension: Secondary | ICD-10-CM

## 2021-02-18 DIAGNOSIS — G4733 Obstructive sleep apnea (adult) (pediatric): Secondary | ICD-10-CM | POA: Diagnosis not present

## 2021-02-18 DIAGNOSIS — J449 Chronic obstructive pulmonary disease, unspecified: Secondary | ICD-10-CM | POA: Diagnosis not present

## 2021-02-18 NOTE — Progress Notes (Signed)
[facility] [place], [place] [address]  Internal MEDICINE  Office Visit Note  Patient Name: Brenda Tucker  [id number]  [id number]  Date of Service: 02/18/2021  Chief Complaint  Patient presents with   Follow-up   Hypertension   Hypothyroidism    HPI Pt is here for routine follow up -She was diagnosed with CHF by cardiology and takes lasix prn.  -She also had follow up with nephrology who has been monitoring her labs and did not make any changes to her medications -BP 140s/60s at home and is better in office today -Wears oxygen at night, waiting on CPAP. She had previously declined CPAP, but states she has to start CPAP in order to get oxygen paid for and is now moving forward with it. -Had pulmonology follow up a few weeks ago, breathing stable  Current Medication: Outpatient Encounter Medications as of 02/18/2021  Medication Sig   acetaminophen (TYLENOL) 500 MG tablet Take 500 mg by mouth every 6 (six) hours as needed for moderate pain or headache.   Albuterol Sulfate (PROAIR RESPICLICK) 425 (90 Base) MCG/ACT AEPB Inhale 2 puffs into the lungs every 4 (four) hours as needed.   amLODipine (NORVASC) 5 MG tablet Take 1 tablet (5 mg total) by mouth daily.   aspirin EC 81 MG tablet Take 81 mg by mouth daily.   calcium carbonate (OSCAL) 1500 (600 Ca) MG TABS tablet Take 600 mg of elemental calcium by mouth daily.   Cholecalciferol (VITAMIN D) 50 MCG (2000 UT) tablet Take 2,000 Units by mouth daily.   fluticasone-salmeterol (ADVAIR DISKUS) 250-50 MCG/ACT AEPB INHALE 1 PUFF INTO THE LUNGS TWICE DAILY   furosemide (LASIX) 20 MG tablet Take 1 tablet (20 mg total) by mouth daily as needed (for weight gain greater than 3 pounds overnight and 5 pounds in 1 week.).   latanoprost (XALATAN) 0.005 % ophthalmic solution Place 1 drop into both eyes at bedtime.   levothyroxine (SYNTHROID) 50 MCG tablet Take 1 tablet (50 mcg total) by mouth daily before breakfast.   losartan  (COZAAR) 100 MG tablet Take 1 tablet (100 mg total) by mouth daily.   vitamin B-12 (CYANOCOBALAMIN) 1000 MCG tablet Take 1,000 mcg by mouth daily.   atorvastatin (LIPITOR) 10 MG tablet Take 1 tablet (10 mg total) by mouth daily.   carvedilol (COREG) 25 MG tablet Take 1 tablet (25 mg total) by mouth 2 (two) times daily.   No facility-administered encounter medications on file as of 02/18/2021.    Surgical History: Past Surgical History:  Procedure Laterality Date   ABDOMINAL HYSTERECTOMY     APPENDECTOMY     CHOLECYSTECTOMY N/A 03/12/2020   Procedure: LAPAROSCOPIC CHOLECYSTECTOMY;  Surgeon: Fredirick Maudlin, MD;  Location: [facility] ORS;  Service: General;  Laterality: N/A;   COLONOSCOPY WITH PROPOFOL N/A 07/09/2015   Procedure: COLONOSCOPY WITH PROPOFOL;  Surgeon: Manya Silvas, MD;  Location: [facility] ENDOSCOPY;  Service: Endoscopy;  Laterality: N/A;    Medical History: Past Medical History:  Diagnosis Date   Arthritis    Asthma    Cancer ([place])    skin, colon polyp   Chronic kidney disease    Complication of anesthesia    asthma attack per pt   COPD (chronic obstructive pulmonary disease) (HCC)    Dyspnea    Hypertension    Hypothyroidism     Family History: Family History  Problem Relation Age of Onset   Heart attack Father    Early death Sister  MVA   Stomach cancer Brother    Cancer Brother        shoulder   Lung cancer Brother    Cancer Sister    Cancer Sister    Early death Sister        husband killed her   Breast cancer Neg Hx     Social History   Socioeconomic History   Marital status: Widowed    Spouse name: Not on file   Number of children: Not on file   Years of education: Not on file   Highest education level: Not on file  Occupational History   Not on file  Tobacco Use   Smoking status: Never   Smokeless tobacco: Never   Tobacco comments:    second hand smoke  Vaping Use   Vaping Use: Never used  Substance and Sexual Activity   Alcohol  use: No   Drug use: No   Sexual activity: Not on file  Other Topics Concern   Not on file  Social History Narrative   Not on file   Social Determinants of Health   Financial Resource Strain: Low Risk    Difficulty of Paying Living Expenses: Not very hard  Food Insecurity: Not on file  Transportation Needs: Not on file  Physical Activity: Not on file  Stress: Not on file  Social Connections: Not on file  Intimate Partner Violence: Not on file      Review of Systems  Constitutional:  Negative for chills, fatigue and unexpected weight change.  HENT:  Negative for congestion, postnasal drip, rhinorrhea, sneezing and sore throat.   Eyes:  Negative for redness.  Respiratory:  Positive for shortness of breath. Negative for cough, chest tightness and wheezing.   Cardiovascular:  Negative for chest pain and palpitations.  Gastrointestinal:  Negative for abdominal pain, constipation, diarrhea, nausea and vomiting.  Genitourinary:  Negative for dysuria and frequency.  Musculoskeletal:  Positive for arthralgias. Negative for back pain, joint swelling and neck pain.  Skin:  Negative for rash.  Neurological: Negative.  Negative for tremors and numbness.  Hematological:  Negative for adenopathy. Does not bruise/bleed easily.  Psychiatric/Behavioral:  Negative for behavioral problems (Depression), sleep disturbance and suicidal ideas. The patient is not nervous/anxious.    Vital Signs: BP 130/70   Pulse 62   Temp 97.8 F (36.6 C)   Resp 16   Ht 4\' 8"  (1.422 m)   Wt 129 lb 12.8 oz (58.9 kg)   SpO2 96%   BMI 29.10 kg/m    Physical Exam Vitals and nursing note reviewed.  Constitutional:      General: She is not in acute distress.    Appearance: She is well-developed. She is not diaphoretic.  HENT:     Head: Normocephalic and atraumatic.     Mouth/Throat:     Pharynx: No oropharyngeal exudate.  Eyes:     Pupils: Pupils are equal, round, and reactive to light.  Neck:      Thyroid: No thyromegaly.     Vascular: No JVD.     Trachea: No tracheal deviation.  Cardiovascular:     Rate and Rhythm: Normal rate and regular rhythm.     Heart sounds: Normal heart sounds. No murmur heard.   No friction rub. No gallop.  Pulmonary:     Effort: Pulmonary effort is normal. No respiratory distress.     Breath sounds: No wheezing or rales.  Chest:     Chest wall: No tenderness.  Abdominal:  General: Bowel sounds are normal.     Palpations: Abdomen is soft.  Musculoskeletal:        General: Normal range of motion.     Cervical back: Normal range of motion and neck supple.     Right lower leg: No edema.     Left lower leg: No edema.  Lymphadenopathy:     Cervical: No cervical adenopathy.  Skin:    General: Skin is warm and dry.  Neurological:     Mental Status: She is alert and oriented to person, place, and time.     Cranial Nerves: No cranial nerve deficit.  Psychiatric:        Behavior: Behavior normal.        Thought Content: Thought content normal.        Judgment: Judgment normal.       Assessment/Plan: 1. Essential hypertension Stable, continue current medications  2. Mixed hyperlipidemia Continue lipitor  3. Chronic obstructive pulmonary disease, unspecified COPD type ([place]) Continue inhalers as prescribed  4. Stage 3b chronic kidney disease ([place]) Followed by nephrology  5. Hypothyroidism, unspecified type Stable, continue synthroid   6. OSA Awaiting CPAP   General Counseling: Ceili verbalizes understanding of the findings of todays visit and agrees with plan of treatment. I have discussed any further diagnostic evaluation that Morissette be needed or ordered today. We also reviewed her medications today. she has been encouraged to call the office with any questions or concerns that should arise related to todays visit.    No orders of the defined types were placed in this encounter.   No orders of the defined types were placed in this  encounter.   This patient was seen by Drema Dallas, PA-C in collaboration with Dr. Clayborn Bigness as a part of collaborative care agreement.   Total time spent:35 Minutes Time spent includes review of chart, medications, test results, and follow up plan with the patient.      Dr Lavera Guise Internal medicine

## 2021-02-23 DIAGNOSIS — J449 Chronic obstructive pulmonary disease, unspecified: Secondary | ICD-10-CM | POA: Diagnosis not present

## 2021-02-28 DIAGNOSIS — H353132 Nonexudative age-related macular degeneration, bilateral, intermediate dry stage: Secondary | ICD-10-CM | POA: Diagnosis not present

## 2021-03-02 DIAGNOSIS — K219 Gastro-esophageal reflux disease without esophagitis: Secondary | ICD-10-CM | POA: Diagnosis not present

## 2021-03-02 DIAGNOSIS — I10 Essential (primary) hypertension: Secondary | ICD-10-CM | POA: Diagnosis not present

## 2021-03-02 DIAGNOSIS — J449 Chronic obstructive pulmonary disease, unspecified: Secondary | ICD-10-CM | POA: Diagnosis not present

## 2021-03-04 ENCOUNTER — Ambulatory Visit: Payer: PPO | Admitting: Physician Assistant

## 2021-03-10 DIAGNOSIS — J449 Chronic obstructive pulmonary disease, unspecified: Secondary | ICD-10-CM | POA: Diagnosis not present

## 2021-03-10 DIAGNOSIS — G4733 Obstructive sleep apnea (adult) (pediatric): Secondary | ICD-10-CM | POA: Diagnosis not present

## 2021-03-16 ENCOUNTER — Telehealth: Payer: Self-pay

## 2021-03-16 NOTE — Telephone Encounter (Signed)
Verbal order for use of Home medical equipment signed by provider and given to [facility] with [facility].

## 2021-03-26 DIAGNOSIS — J449 Chronic obstructive pulmonary disease, unspecified: Secondary | ICD-10-CM | POA: Diagnosis not present

## 2021-03-27 ENCOUNTER — Other Ambulatory Visit: Payer: Self-pay | Admitting: Internal Medicine

## 2021-03-28 ENCOUNTER — Other Ambulatory Visit: Payer: Self-pay

## 2021-03-28 MED ORDER — FLUTICASONE-SALMETEROL 250-50 MCG/ACT IN AEPB: INHALATION_SPRAY | RESPIRATORY_TRACT | 3 refills | Status: DC

## 2021-04-08 ENCOUNTER — Encounter: Payer: Self-pay | Admitting: General Surgery

## 2021-04-09 DIAGNOSIS — J449 Chronic obstructive pulmonary disease, unspecified: Secondary | ICD-10-CM | POA: Diagnosis not present

## 2021-04-10 ENCOUNTER — Telehealth: Payer: Self-pay

## 2021-04-10 NOTE — Telephone Encounter (Signed)
PA for [place] 250-50 sent 04/10/21 @ 8:14pm

## 2021-04-19 DIAGNOSIS — G4733 Obstructive sleep apnea (adult) (pediatric): Secondary | ICD-10-CM | POA: Diagnosis not present

## 2021-04-21 ENCOUNTER — Encounter (INDEPENDENT_AMBULATORY_CARE_PROVIDER_SITE_OTHER): Payer: Self-pay

## 2021-04-21 ENCOUNTER — Other Ambulatory Visit: Payer: Self-pay

## 2021-04-21 ENCOUNTER — Ambulatory Visit: Payer: PPO | Admitting: Internal Medicine

## 2021-04-21 ENCOUNTER — Encounter: Payer: Self-pay | Admitting: Internal Medicine

## 2021-04-21 VITALS — BP 170/72 | HR 66 | Temp 97.8°F | Resp 16 | Ht <= 58 in | Wt 131.6 lb

## 2021-04-21 DIAGNOSIS — N1832 Chronic kidney disease, stage 3b: Secondary | ICD-10-CM

## 2021-04-21 DIAGNOSIS — G4733 Obstructive sleep apnea (adult) (pediatric): Secondary | ICD-10-CM | POA: Diagnosis not present

## 2021-04-21 DIAGNOSIS — R0602 Shortness of breath: Secondary | ICD-10-CM | POA: Diagnosis not present

## 2021-04-21 DIAGNOSIS — J449 Chronic obstructive pulmonary disease, unspecified: Secondary | ICD-10-CM

## 2021-04-21 DIAGNOSIS — Z7189 Other specified counseling: Secondary | ICD-10-CM

## 2021-04-21 NOTE — Progress Notes (Signed)
[facility] [place], [place] [address]  Pulmonary Sleep Medicine   Office Visit Note  Patient Name: Brenda Tucker DOB: [birth year] MRN [id number]  Date of Service: 04/21/2021  Complaints/HPI: COPD never smoker. PFT results reviewed with her. She states she is doing OK with her breathing overall. Denies having CP. She is on the CPAP and feels better. She also has oxygen and I will get a follow up oximetry on the CPAP. NO admissions to the hospital. She states no exposure to covid. She is not vaccinated she is supposed to have her flu shot next month her Pneumonia shot is up to date  ROS  General: (-) fever, (-) chills, (-) night sweats, (-) weakness Skin: (-) rashes, (-) itching,. Eyes: (-) visual changes, (-) redness, (-) itching. Nose and Sinuses: (-) nasal stuffiness or itchiness, (-) postnasal drip, (-) nosebleeds, (-) sinus trouble. Mouth and Throat: (-) sore throat, (-) hoarseness. Neck: (-) swollen glands, (-) enlarged thyroid, (-) neck pain. Respiratory: - cough, (-) bloody sputum, + shortness of breath, - wheezing. Cardiovascular: - ankle swelling, (-) chest pain. Lymphatic: (-) lymph node enlargement. Neurologic: (-) numbness, (-) tingling. Psychiatric: (-) anxiety, (-) depression   Current Medication: Outpatient Encounter Medications as of 04/21/2021  Medication Sig   acetaminophen (TYLENOL) 500 MG tablet Take 500 mg by mouth every 6 (six) hours as needed for moderate pain or headache.   Albuterol Sulfate (PROAIR RESPICLICK) 003 (90 Base) MCG/ACT AEPB Inhale 2 puffs into the lungs every 4 (four) hours as needed.   amLODipine (NORVASC) 5 MG tablet Take 1 tablet (5 mg total) by mouth daily.   aspirin EC 81 MG tablet Take 81 mg by mouth daily.   calcium carbonate (OSCAL) 1500 (600 Ca) MG TABS tablet Take 600 mg of elemental calcium by mouth daily.   Cholecalciferol (VITAMIN D) 50 MCG (2000 UT) tablet Take 2,000 Units by mouth daily.    fluticasone-salmeterol (ADVAIR DISKUS) 250-50 MCG/ACT AEPB INHALE 1 PUFF INTO THE LUNGS TWICE DAILY   furosemide (LASIX) 20 MG tablet Take 1 tablet (20 mg total) by mouth daily as needed (for weight gain greater than 3 pounds overnight and 5 pounds in 1 week.).   latanoprost (XALATAN) 0.005 % ophthalmic solution Place 1 drop into both eyes at bedtime.   levothyroxine (SYNTHROID) 50 MCG tablet Take 1 tablet (50 mcg total) by mouth daily before breakfast.   losartan (COZAAR) 100 MG tablet Take 1 tablet (100 mg total) by mouth daily.   vitamin B-12 (CYANOCOBALAMIN) 1000 MCG tablet Take 1,000 mcg by mouth daily.   atorvastatin (LIPITOR) 10 MG tablet Take 1 tablet (10 mg total) by mouth daily.   carvedilol (COREG) 25 MG tablet Take 1 tablet (25 mg total) by mouth 2 (two) times daily.   No facility-administered encounter medications on file as of 04/21/2021.    Surgical History: Past Surgical History:  Procedure Laterality Date   ABDOMINAL HYSTERECTOMY     APPENDECTOMY     CHOLECYSTECTOMY N/A 03/12/2020   Procedure: LAPAROSCOPIC CHOLECYSTECTOMY;  Surgeon: Fredirick Maudlin, MD;  Location: [facility] ORS;  Service: General;  Laterality: N/A;   COLONOSCOPY WITH PROPOFOL N/A 07/09/2015   Procedure: COLONOSCOPY WITH PROPOFOL;  Surgeon: Manya Silvas, MD;  Location: [facility] ENDOSCOPY;  Service: Endoscopy;  Laterality: N/A;    Medical History: Past Medical History:  Diagnosis Date   Arthritis    Asthma    Cancer ([place])    skin, colon polyp   Chronic kidney disease  Complication of anesthesia    asthma attack per pt   COPD (chronic obstructive pulmonary disease) (HCC)    Dyspnea    Hypertension    Hypothyroidism     Family History: Family History  Problem Relation Age of Onset   Heart attack Father    Early death Sister        MVA   Stomach cancer Brother    Cancer Brother        shoulder   Lung cancer Brother    Cancer Sister    Cancer Sister    Early death Sister        husband killed  her   Breast cancer Neg Hx     Social History: Social History   Socioeconomic History   Marital status: Widowed    Spouse name: Not on file   Number of children: Not on file   Years of education: Not on file   Highest education level: Not on file  Occupational History   Not on file  Tobacco Use   Smoking status: Never   Smokeless tobacco: Never   Tobacco comments:    second hand smoke  Vaping Use   Vaping Use: Never used  Substance and Sexual Activity   Alcohol use: No   Drug use: No   Sexual activity: Not on file  Other Topics Concern   Not on file  Social History Narrative   Not on file   Social Determinants of Health   Financial Resource Strain: Low Risk    Difficulty of Paying Living Expenses: Not very hard  Food Insecurity: Not on file  Transportation Needs: Not on file  Physical Activity: Not on file  Stress: Not on file  Social Connections: Not on file  Intimate Partner Violence: Not on file    Vital Signs: Blood pressure (!) 170/72, pulse 66, temperature 97.8 F (36.6 C), resp. rate 16, height 4\' 8"  (1.422 m), weight 131 lb 9.6 oz (59.7 kg), SpO2 94 %.  Examination: General Appearance: The patient is well-developed, well-nourished, and in no distress. Skin: Gross inspection of skin unremarkable. Head: normocephalic, no gross deformities. Eyes: no gross deformities noted. ENT: ears appear grossly normal no exudates. Neck: Supple. No thyromegaly. No LAD. Respiratory: no rhonchi noted. Cardiovascular: Normal S1 and S2 without murmur or rub. Extremities: No cyanosis. pulses are equal. Neurologic: Alert and oriented. No involuntary movements.  LABS: Recent Results (from the past 2160 hour(s))  Pulmonary function test     Status: None   Collection Time: 01/26/21 10:00 AM  Result Value Ref Range   FEV1     FVC     FEV1/FVC     TLC     DLCO      Radiology: LONG TERM MONITOR (3-14 DAYS)  Result Date: 07/30/2020 Patch Wear Time:  13 days and 17  hours (2022-01-07T14:51:14-0500 to 2022-01-21T08:32:08-498) Patient had a min HR of 52 bpm, max HR of 162 bpm, and avg HR of 62 bpm. Predominant underlying rhythm was Sinus Rhythm. No evidence of atrial fibrillation or atrial flutter noted on cardiac monitor.   No results found.  No results found.    Assessment and Plan: Patient Active Problem List   Diagnosis Date Noted   Generalized abdominal pain 12/30/2019   Stage 3 chronic kidney disease ([place]) 06/30/2019   Urinary tract infection with hematuria 12/20/2018   Low back pain 12/20/2018   Bilateral carotid artery stenosis 06/19/2018   Cellulitis of finger of right hand 03/13/2018  Encounter for screening mammogram for malignant neoplasm of breast 12/18/2017   Vitamin D deficiency 12/18/2017   Dysuria 12/18/2017   Chronic obstructive pulmonary disease ([place]) 08/17/2017   SOB (shortness of breath) 08/17/2017   GERD (gastroesophageal reflux disease) 08/17/2017   Hyperlipidemia 08/17/2017   Hypertension 08/17/2017   Hypothyroidism 08/17/2017   Osteoarthritis 08/17/2017    1. OSA (obstructive sleep apnea) Continue with CPAP therapy good response to treatment we will continue to follow along  2. Obstructive chronic bronchitis without exacerbation [facility]) Medical management inhalers as necessary  3. CPAP use counseling CPAP Counseling: had a lengthy discussion with the patient regarding the importance of PAP therapy in management of the sleep apnea. Patient appears to understand the risk factor reduction and also understands the risks associated with untreated sleep apnea. Patient will try to make a good faith effort to remain compliant with therapy. Also instructed the patient on proper cleaning of the device including the water must be changed daily if possible and use of distilled water is preferred. Patient understands that the machine should be regularly cleaned with appropriate recommended cleaning solutions that do not damage the  PAP machine for example given white vinegar and water rinses. Other methods such as ozone treatment [place] not be as good as these simple methods to achieve cleaning.   4. Shortness of breath She is at her baseline  5. Stage 3b chronic kidney disease ([place]) Follows with nephrology  General Counseling: I have discussed the findings of the evaluation and examination with [facility].  I have also discussed any further diagnostic evaluation thatmay be needed or ordered today. Michelle verbalizes understanding of the findings of todays visit. We also reviewed her medications today and discussed drug interactions and side effects including but not limited excessive drowsiness and altered mental states. We also discussed that there is always a risk not just to her but also people around her. she has been encouraged to call the office with any questions or concerns that should arise related to todays visit.  No orders of the defined types were placed in this encounter.    Time spent: 56  I have personally obtained a history, examined the patient, evaluated laboratory and imaging results, formulated the assessment and plan and placed orders.    Allyne Gee, MD [facility] Pulmonary and Critical Care Sleep medicine

## 2021-04-21 NOTE — Patient Instructions (Signed)
Sleep Apnea Sleep apnea affects breathing during sleep. It causes breathing to stop for 10 seconds or more, or to become shallow. People with sleep apnea usually snoreloudly. It can also increase the risk of: Heart attack. Stroke. Being very overweight (obese). Diabetes. Heart failure. Irregular heartbeat. High blood pressure. The goal of treatment is to help you breathe normally again. What are the causes?  The most common cause of this condition is a collapsed or blocked airway. There are three kinds of sleep apnea: Obstructive sleep apnea. This is caused by a blocked or collapsed airway. Central sleep apnea. This happens when the brain does not send the right signals to the muscles that control breathing. Mixed sleep apnea. This is a combination of obstructive and central sleep apnea. What increases the risk? Being overweight. Smoking. Having a small airway. Being older. Being female. Drinking alcohol. Taking medicines to calm yourself (sedatives or tranquilizers). Having family members with the condition. Having a tongue or tonsils that are larger than normal. What are the signs or symptoms? Trouble staying asleep. Loud snoring. Headaches in the morning. Waking up gasping. Dry mouth or sore throat in the morning. Being sleepy or tired during the day. If you are sleepy or tired during the day, you [place] also: Not be able to focus your mind (concentrate). Forget things. Get angry a lot and have mood swings. Feel sad (depressed). Have changes in your personality. Have less interest in sex, if you are female. Be unable to have an erection, if you are female. How is this treated?  Sleeping on your side. Using a medicine to get rid of mucus in your nose (decongestant). Avoiding the use of alcohol, medicines to help you relax, or certain pain medicines (narcotics). Losing weight, if needed. Changing your diet. Quitting smoking. Using a machine to open your airway while you  sleep, such as: An oral appliance. This is a mouthpiece that shifts your lower jaw forward. A CPAP device. This device blows air through a mask when you breathe out (exhale). An EPAP device. This has valves that you put in each nostril. A BPAP device. This device blows air through a mask when you breathe in (inhale) and breathe out. Having surgery if other treatments do not work. Follow these instructions at home: Lifestyle Make changes that your doctor recommends. Eat a healthy diet. Lose weight if needed. Avoid alcohol, medicines to help you relax, and some pain medicines. Do not smoke or use any products that contain nicotine or tobacco. If you need help quitting, ask your doctor. General instructions Take over-the-counter and prescription medicines only as told by your doctor. If you were given a machine to use while you sleep, use it only as told by your doctor. If you are having surgery, make sure to tell your doctor you have sleep apnea. You [place] need to bring your device with you. Keep all follow-up visits. Contact a doctor if: The machine that you were given to use during sleep bothers you or does not seem to be working. You do not get better. You get worse. Get help right away if: Your chest hurts. You have trouble breathing in enough air. You have an uncomfortable feeling in your back, arms, or stomach. You have trouble talking. One side of your body feels weak. A part of your face is hanging down. These symptoms [place] be an emergency. Get help right away. Call your local emergency services (911 in the [place] Do not wait to see if the symptoms   will go away. Do not drive yourself to the hospital. Summary This condition affects breathing during sleep. The most common cause is a collapsed or blocked airway. The goal of treatment is to help you breathe normally while you sleep. This information is not intended to replace advice given to you by your health care provider. Make  sure you discuss any questions you have with your healthcare provider. Document Revised: 05/28/2020 Document Reviewed: 05/28/2020 Elsevier Patient Education  2022 Elsevier Inc.  

## 2021-04-22 ENCOUNTER — Telehealth: Payer: Self-pay

## 2021-04-25 DIAGNOSIS — J449 Chronic obstructive pulmonary disease, unspecified: Secondary | ICD-10-CM | POA: Diagnosis not present

## 2021-05-10 DIAGNOSIS — J449 Chronic obstructive pulmonary disease, unspecified: Secondary | ICD-10-CM | POA: Diagnosis not present

## 2021-05-20 ENCOUNTER — Encounter: Payer: Self-pay | Admitting: Physician Assistant

## 2021-05-20 ENCOUNTER — Other Ambulatory Visit: Payer: Self-pay

## 2021-05-20 ENCOUNTER — Ambulatory Visit (INDEPENDENT_AMBULATORY_CARE_PROVIDER_SITE_OTHER): Payer: PPO | Admitting: Physician Assistant

## 2021-05-20 DIAGNOSIS — R079 Chest pain, unspecified: Secondary | ICD-10-CM | POA: Diagnosis not present

## 2021-05-20 DIAGNOSIS — I10 Essential (primary) hypertension: Secondary | ICD-10-CM | POA: Diagnosis not present

## 2021-05-20 DIAGNOSIS — N1832 Chronic kidney disease, stage 3b: Secondary | ICD-10-CM

## 2021-05-20 DIAGNOSIS — E782 Mixed hyperlipidemia: Secondary | ICD-10-CM | POA: Diagnosis not present

## 2021-05-20 DIAGNOSIS — G4733 Obstructive sleep apnea (adult) (pediatric): Secondary | ICD-10-CM | POA: Diagnosis not present

## 2021-05-20 MED ORDER — ATORVASTATIN CALCIUM 10 MG PO TABS: ORAL_TABLET | ORAL | 2 refills | Status: DC

## 2021-05-20 MED ORDER — CARVEDILOL 25 MG PO TABS: 25.0000 mg | ORAL_TABLET | Freq: Two times a day (BID) | ORAL | 3 refills | Status: DC

## 2021-05-20 NOTE — Progress Notes (Signed)
[facility] [place], [place] [address]  Internal MEDICINE  Office Visit Note  Patient Name: Brenda Tucker  [id number]  [id number]  Date of Service: 05/22/2021  Chief Complaint  Patient presents with   Follow-up   Hypertension   Hypothyroidism   Chest Pain   Numbness    Left arm    HPI Pt is here for routine follow up -She is followed by cardiology for CHF. States she had an episode of CP with numbness and tingling down left arm 2 nights ago. She states it resolved on its own and has not occurred since. She states she would have called EMS if had gotten worse and states she feels fine now. Advised to go to ED if any recurrence and to notify her cardiologist--will send message as well. -She is also followed by nephrology, goes back to see them in Feb -BP stable, at home this morning was 130/64 -using cpap and oxygen nightly, sleeping ok -Taking lipitor every other day and ASA daily  Current Medication: Outpatient Encounter Medications as of 05/20/2021  Medication Sig   acetaminophen (TYLENOL) 500 MG tablet Take 500 mg by mouth every 6 (six) hours as needed for moderate pain or headache.   Albuterol Sulfate (PROAIR RESPICLICK) 944 (90 Base) MCG/ACT AEPB Inhale 2 puffs into the lungs every 4 (four) hours as needed.   amLODipine (NORVASC) 5 MG tablet Take 1 tablet (5 mg total) by mouth daily.   aspirin EC 81 MG tablet Take 81 mg by mouth daily.   calcium carbonate (OSCAL) 1500 (600 Ca) MG TABS tablet Take 600 mg of elemental calcium by mouth daily.   Cholecalciferol (VITAMIN D) 50 MCG (2000 UT) tablet Take 2,000 Units by mouth daily.   fluticasone-salmeterol (ADVAIR DISKUS) 250-50 MCG/ACT AEPB INHALE 1 PUFF INTO THE LUNGS TWICE DAILY   furosemide (LASIX) 20 MG tablet Take 1 tablet (20 mg total) by mouth daily as needed (for weight gain greater than 3 pounds overnight and 5 pounds in 1 week.).   latanoprost (XALATAN) 0.005 % ophthalmic solution Place 1 drop  into both eyes at bedtime.   levothyroxine (SYNTHROID) 50 MCG tablet Take 1 tablet (50 mcg total) by mouth daily before breakfast.   losartan (COZAAR) 100 MG tablet Take 1 tablet (100 mg total) by mouth daily.   vitamin B-12 (CYANOCOBALAMIN) 1000 MCG tablet Take 1,000 mcg by mouth daily.   atorvastatin (LIPITOR) 10 MG tablet Take 1 tablet by mouth every other day.   carvedilol (COREG) 25 MG tablet Take 1 tablet (25 mg total) by mouth 2 (two) times daily.   [DISCONTINUED] atorvastatin (LIPITOR) 10 MG tablet Take 1 tablet (10 mg total) by mouth daily.   [DISCONTINUED] carvedilol (COREG) 25 MG tablet Take 1 tablet (25 mg total) by mouth 2 (two) times daily.   No facility-administered encounter medications on file as of 05/20/2021.    Surgical History: Past Surgical History:  Procedure Laterality Date   ABDOMINAL HYSTERECTOMY     APPENDECTOMY     CHOLECYSTECTOMY N/A 03/12/2020   Procedure: LAPAROSCOPIC CHOLECYSTECTOMY;  Surgeon: Fredirick Maudlin, MD;  Location: [facility] ORS;  Service: General;  Laterality: N/A;   COLONOSCOPY WITH PROPOFOL N/A 07/09/2015   Procedure: COLONOSCOPY WITH PROPOFOL;  Surgeon: Manya Silvas, MD;  Location: [facility] ENDOSCOPY;  Service: Endoscopy;  Laterality: N/A;    Medical History: Past Medical History:  Diagnosis Date   Arthritis    Asthma    Cancer ([place])    skin, colon  polyp   Chronic kidney disease    Complication of anesthesia    asthma attack per pt   COPD (chronic obstructive pulmonary disease) (HCC)    Dyspnea    Hypertension    Hypothyroidism     Family History: Family History  Problem Relation Age of Onset   Heart attack Father    Early death Sister        MVA   Stomach cancer Brother    Cancer Brother        shoulder   Lung cancer Brother    Cancer Sister    Cancer Sister    Early death Sister        husband killed her   Breast cancer Neg Hx     Social History   Socioeconomic History   Marital status: Widowed    Spouse name: Not on  file   Number of children: Not on file   Years of education: Not on file   Highest education level: Not on file  Occupational History   Not on file  Tobacco Use   Smoking status: Never   Smokeless tobacco: Never   Tobacco comments:    second hand smoke  Vaping Use   Vaping Use: Never used  Substance and Sexual Activity   Alcohol use: No   Drug use: No   Sexual activity: Not on file  Other Topics Concern   Not on file  Social History Narrative   Not on file   Social Determinants of Health   Financial Resource Strain: Low Risk    Difficulty of Paying Living Expenses: Not very hard  Food Insecurity: Not on file  Transportation Needs: Not on file  Physical Activity: Not on file  Stress: Not on file  Social Connections: Not on file  Intimate Partner Violence: Not on file      Review of Systems  Constitutional:  Negative for chills, fatigue and unexpected weight change.  HENT:  Negative for congestion, postnasal drip, rhinorrhea, sneezing and sore throat.   Eyes:  Negative for redness.  Respiratory:  Positive for shortness of breath. Negative for cough, chest tightness and wheezing.   Cardiovascular:  Negative for chest pain and palpitations.  Gastrointestinal:  Negative for abdominal pain, constipation, diarrhea, nausea and vomiting.  Genitourinary:  Negative for dysuria and frequency.  Musculoskeletal:  Positive for arthralgias. Negative for back pain, joint swelling and neck pain.  Skin:  Negative for rash.  Neurological: Negative.  Negative for tremors and numbness.  Hematological:  Negative for adenopathy. Does not bruise/bleed easily.  Psychiatric/Behavioral:  Negative for behavioral problems (Depression), sleep disturbance and suicidal ideas. The patient is not nervous/anxious.    Vital Signs: BP 120/60   Pulse 79   Temp 97.8 F (36.6 C)   Resp 16   Ht 4\' 8"  (1.422 m)   Wt 132 lb (59.9 kg)   SpO2 97%   BMI 29.59 kg/m    Physical Exam Vitals and nursing  note reviewed.  Constitutional:      General: She is not in acute distress.    Appearance: She is well-developed. She is not diaphoretic.  HENT:     Head: Normocephalic and atraumatic.     Mouth/Throat:     Pharynx: No oropharyngeal exudate.  Eyes:     Pupils: Pupils are equal, round, and reactive to light.  Neck:     Thyroid: No thyromegaly.     Vascular: No JVD.     Trachea: No tracheal deviation.  Cardiovascular:     Rate and Rhythm: Normal rate and regular rhythm.     Heart sounds: Normal heart sounds. No murmur heard.   No friction rub. No gallop.  Pulmonary:     Effort: Pulmonary effort is normal. No respiratory distress.     Breath sounds: No wheezing or rales.  Chest:     Chest wall: No tenderness.  Abdominal:     General: Bowel sounds are normal.     Palpations: Abdomen is soft.  Musculoskeletal:        General: Normal range of motion.     Cervical back: Normal range of motion and neck supple.     Right lower leg: No edema.     Left lower leg: No edema.  Lymphadenopathy:     Cervical: No cervical adenopathy.  Skin:    General: Skin is warm and dry.  Neurological:     Mental Status: She is alert and oriented to person, place, and time.     Cranial Nerves: No cranial nerve deficit.  Psychiatric:        Behavior: Behavior normal.        Thought Content: Thought content normal.        Judgment: Judgment normal.       Assessment/Plan: 1. Essential hypertension Stable continue current medication  2. Chest pain, unspecified type Denies active CP, EKG in office consistent with previous EKG via cardiology. Advised to follow up with cardiology--message sent via epic as well. Also advised to go to ED if any recurrence and to continue daily ASA and lipitor. - EKG 12-Lead  3. Mixed hyperlipidemia Continue lipitor - atorvastatin (LIPITOR) 10 MG tablet; Take 1 tablet by mouth every other day.  Dispense: 45 tablet; Refill: 2  4. OSA (obstructive sleep  apnea) Continue cpap  5. Stage 3b chronic kidney disease ([place]) Followed by nephrology   General Counseling: Ameliya verbalizes understanding of the findings of todays visit and agrees with plan of treatment. I have discussed any further diagnostic evaluation that Hitt be needed or ordered today. We also reviewed her medications today. she has been encouraged to call the office with any questions or concerns that should arise related to todays visit.    Orders Placed This Encounter  Procedures   EKG 12-Lead    Meds ordered this encounter  Medications   atorvastatin (LIPITOR) 10 MG tablet    Sig: Take 1 tablet by mouth every other day.    Dispense:  45 tablet    Refill:  2   carvedilol (COREG) 25 MG tablet    Sig: Take 1 tablet (25 mg total) by mouth 2 (two) times daily.    Dispense:  180 tablet    Refill:  3    This patient was seen by Drema Dallas, PA-C in collaboration with Dr. Clayborn Bigness as a part of collaborative care agreement.   Total time spent:40 Minutes Time spent includes review of chart, medications, test results, and follow up plan with the patient.      Dr Lavera Guise Internal medicine

## 2021-05-26 DIAGNOSIS — J449 Chronic obstructive pulmonary disease, unspecified: Secondary | ICD-10-CM | POA: Diagnosis not present

## 2021-05-28 DIAGNOSIS — G4733 Obstructive sleep apnea (adult) (pediatric): Secondary | ICD-10-CM | POA: Diagnosis not present

## 2021-06-08 ENCOUNTER — Telehealth: Payer: Self-pay

## 2021-06-08 NOTE — Telephone Encounter (Signed)
Pt called and advised that she got a letter from insurance about her oxygen and that she can't afford to pay it they are stating it is 500.00 and she gets this paper every month.  I informed pt that she will need to call [facility] and ask them about it bc we don't do the billing for oxygen.

## 2021-06-09 DIAGNOSIS — J449 Chronic obstructive pulmonary disease, unspecified: Secondary | ICD-10-CM | POA: Diagnosis not present

## 2021-06-20 ENCOUNTER — Other Ambulatory Visit: Payer: Self-pay | Admitting: Physician Assistant

## 2021-06-20 DIAGNOSIS — I10 Essential (primary) hypertension: Secondary | ICD-10-CM

## 2021-06-25 ENCOUNTER — Other Ambulatory Visit: Payer: Self-pay | Admitting: Physician Assistant

## 2021-06-25 DIAGNOSIS — J449 Chronic obstructive pulmonary disease, unspecified: Secondary | ICD-10-CM | POA: Diagnosis not present

## 2021-06-28 NOTE — Telephone Encounter (Signed)
Refill Request.  

## 2021-07-10 DIAGNOSIS — J449 Chronic obstructive pulmonary disease, unspecified: Secondary | ICD-10-CM | POA: Diagnosis not present

## 2021-07-12 ENCOUNTER — Telehealth: Payer: Self-pay

## 2021-07-12 NOTE — Telephone Encounter (Signed)
Order for OSA supplies signed by provider and placed in [facility] folder. 

## 2021-07-18 DIAGNOSIS — H40023 Open angle with borderline findings, high risk, bilateral: Secondary | ICD-10-CM | POA: Diagnosis not present

## 2021-07-25 ENCOUNTER — Telehealth: Payer: Self-pay | Admitting: Student-PharmD

## 2021-07-25 NOTE — Progress Notes (Addendum)
Hypertension (HTN) Review Call   [facility] E [id number]  77 years, Female  DOB: [birth year]  M: [phone]  Hypertension Review (HC) Completed by Charlann Lange on 07/25/2021  Chart Review  Is the patient enrolled in RPM with BP Monitor?: No  BP #1 reading (last): 120/60 on: 05/20/2021  BP #2 reading: 170/72 on: 04/20/2021  BP #3 reading: 130/70 on: 02/17/2021  Any of the last 3 BP > 140/90 mmHg?: Yes  What recent interventions have been made by any provider to improve the patient's conditions in the last 3 months?:   Office Visit: 02/18/21 Mylinda Latina, PA-C. For hypertension. No medication changes. 03/02/21 Devona Konig A. For hypertension. No other information given. 04/21/21 Devona Konig A.  For obstructive sleep apnea. No medication changes. 05/20/21 McDonough, Si Gaul, PA-C. For hypertension. No medication changes.  Consults:  02/28/21 Ophthalmology Roosevelt Locks, Ronny Flurry.  For Nonexudative age-related macular degeneration. No other information given.  Any recent hospitalizations or ED visits since last visit with CPP?: No  Adherence Review Adherence rates for STAR metric medications: Atorvastatin 10 mg 06/20/21 90 DS Losartan 100 mg 07/07/21 90 DS  Adherence rates for medications indicated for disease state being reviewed: Losartan 100 mg 07/07/21 90 DS Does the patient have >5 day gap between last estimated fill dates for any of the above medications?: No  Disease State Questions Able to connect with the Patient?: Yes  Is the patient monitoring his/her BP?: Yes  How often are you checking your BP?: occasionally  Home BP Reading #1 (most recent): 120/70  Is the patient having any low BP Readings <90/60?: No  Is the patient having any BP readings above >180/100?: No  Is the patient's average BP>140/90?: No  What is your blood pressure goal?: 120/80  Educate patient to inform proper points on checking BP at home:: Sit with feet flat on the floor, arm at  heart level., Do not drink caffeine or smoke a cigarette at least 30 min. prior to checking.  What diet changes have you made to improve your Blood Pressure Control?: eating more home-cooked meals  What exercise are you doing to improve your Blood Pressure Control?: no formal exercise  Misc. Response/Information:: Patient stated her insurance is not paying for her albuterol any more but she has more then enough to last her to the next visit. so she will take till then to see what the doctor wants to do. She stated her insurance  is also not paying for her oxygen and she is not sure why, she state she cant continue to pay for it.  Charlann Lange, [place]  [phone]  Pharmacist Review Adherence gaps identified?: No Drug Therapy Problems identified?: No Assessment: Controlled  Alena Bills Clinical Pharmacist

## 2021-07-26 ENCOUNTER — Other Ambulatory Visit: Payer: Self-pay

## 2021-07-26 DIAGNOSIS — E039 Hypothyroidism, unspecified: Secondary | ICD-10-CM

## 2021-07-26 DIAGNOSIS — J449 Chronic obstructive pulmonary disease, unspecified: Secondary | ICD-10-CM | POA: Diagnosis not present

## 2021-07-26 MED ORDER — LEVOTHYROXINE SODIUM 50 MCG PO TABS: 50.0000 ug | ORAL_TABLET | Freq: Every day | ORAL | 3 refills | Status: DC

## 2021-07-31 DIAGNOSIS — I10 Essential (primary) hypertension: Secondary | ICD-10-CM | POA: Diagnosis not present

## 2021-07-31 DIAGNOSIS — K219 Gastro-esophageal reflux disease without esophagitis: Secondary | ICD-10-CM | POA: Diagnosis not present

## 2021-07-31 DIAGNOSIS — J449 Chronic obstructive pulmonary disease, unspecified: Secondary | ICD-10-CM | POA: Diagnosis not present

## 2021-08-08 ENCOUNTER — Other Ambulatory Visit: Payer: Self-pay

## 2021-08-08 ENCOUNTER — Encounter: Payer: Self-pay | Admitting: Internal Medicine

## 2021-08-08 ENCOUNTER — Ambulatory Visit: Payer: PPO | Admitting: Internal Medicine

## 2021-08-08 VITALS — BP 110/58 | HR 70 | Temp 98.0°F | Resp 16 | Ht <= 58 in | Wt 130.0 lb

## 2021-08-08 DIAGNOSIS — J449 Chronic obstructive pulmonary disease, unspecified: Secondary | ICD-10-CM | POA: Diagnosis not present

## 2021-08-08 DIAGNOSIS — I129 Hypertensive chronic kidney disease with stage 1 through stage 4 chronic kidney disease, or unspecified chronic kidney disease: Secondary | ICD-10-CM | POA: Diagnosis not present

## 2021-08-08 DIAGNOSIS — R0602 Shortness of breath: Secondary | ICD-10-CM

## 2021-08-08 DIAGNOSIS — G4733 Obstructive sleep apnea (adult) (pediatric): Secondary | ICD-10-CM

## 2021-08-08 DIAGNOSIS — R809 Proteinuria, unspecified: Secondary | ICD-10-CM | POA: Diagnosis not present

## 2021-08-08 DIAGNOSIS — N1832 Chronic kidney disease, stage 3b: Secondary | ICD-10-CM | POA: Diagnosis not present

## 2021-08-08 DIAGNOSIS — Z7189 Other specified counseling: Secondary | ICD-10-CM

## 2021-08-08 DIAGNOSIS — R319 Hematuria, unspecified: Secondary | ICD-10-CM | POA: Diagnosis not present

## 2021-08-08 MED ORDER — BUDESONIDE-FORMOTEROL FUMARATE 160-4.5 MCG/ACT IN AERO: 2.0000 | INHALATION_SPRAY | Freq: Two times a day (BID) | RESPIRATORY_TRACT | 3 refills | Status: DC

## 2021-08-08 MED ORDER — ALBUTEROL SULFATE HFA 108 (90 BASE) MCG/ACT IN AERS: 2.0000 | INHALATION_SPRAY | Freq: Four times a day (QID) | RESPIRATORY_TRACT | 0 refills | Status: DC | PRN

## 2021-08-08 NOTE — Progress Notes (Signed)
[facility] [place], [place] [address]  Pulmonary Sleep Medicine   Office Visit Note  Patient Name: Brenda Tucker DOB: [birth year] MRN [id number]  Date of Service: 08/08/2021  Complaints/HPI: COPD. She states she is on oxygen but has been told by her insurance they will not pay. Patient states that she has been using her CPAP as prescribed. Still states has SOB with exertion. She also notes cough which is wet. She is bringing up white clear sputum. She has not been using inhalers due to availability. She states that she has not had any admissions to the hospital  ROS  General: (-) fever, (-) chills, (-) night sweats, (-) weakness Skin: (-) rashes, (-) itching,. Eyes: (-) visual changes, (-) redness, (-) itching. Nose and Sinuses: (-) nasal stuffiness or itchiness, (-) postnasal drip, (-) nosebleeds, (-) sinus trouble. Mouth and Throat: (-) sore throat, (-) hoarseness. Neck: (-) swollen glands, (-) enlarged thyroid, (-) neck pain. Respiratory: + cough, (-) bloody sputum, + shortness of breath, + wheezing. Cardiovascular: - ankle swelling, (-) chest pain. Lymphatic: (-) lymph node enlargement. Neurologic: (-) numbness, (-) tingling. Psychiatric: (-) anxiety, (-) depression   Current Medication: Outpatient Encounter Medications as of 08/08/2021  Medication Sig   acetaminophen (TYLENOL) 500 MG tablet Take 500 mg by mouth every 6 (six) hours as needed for moderate pain or headache.   Albuterol Sulfate (PROAIR RESPICLICK) 601 (90 Base) MCG/ACT AEPB Inhale 2 puffs into the lungs every 4 (four) hours as needed.   amLODipine (NORVASC) 5 MG tablet TAKE 1 TABLET(5 MG) BY MOUTH DAILY   aspirin EC 81 MG tablet Take 81 mg by mouth daily.   atorvastatin (LIPITOR) 10 MG tablet Take 1 tablet by mouth every other day.   calcium carbonate (OSCAL) 1500 (600 Ca) MG TABS tablet Take 600 mg of elemental calcium by mouth daily.   carvedilol (COREG) 25 MG tablet TAKE 1 TABLET(25 MG)  BY MOUTH TWICE DAILY   Cholecalciferol (VITAMIN D) 50 MCG (2000 UT) tablet Take 2,000 Units by mouth daily.   fluticasone-salmeterol (ADVAIR DISKUS) 250-50 MCG/ACT AEPB INHALE 1 PUFF INTO THE LUNGS TWICE DAILY   furosemide (LASIX) 20 MG tablet Take 1 tablet (20 mg total) by mouth daily as needed (for weight gain greater than 3 pounds overnight and 5 pounds in 1 week.).   latanoprost (XALATAN) 0.005 % ophthalmic solution Place 1 drop into both eyes at bedtime.   levothyroxine (SYNTHROID) 50 MCG tablet Take 1 tablet (50 mcg total) by mouth daily before breakfast.   losartan (COZAAR) 100 MG tablet Take 1 tablet (100 mg total) by mouth daily.   vitamin B-12 (CYANOCOBALAMIN) 1000 MCG tablet Take 1,000 mcg by mouth daily.   No facility-administered encounter medications on file as of 08/08/2021.    Surgical History: Past Surgical History:  Procedure Laterality Date   ABDOMINAL HYSTERECTOMY     APPENDECTOMY     CHOLECYSTECTOMY N/A 03/12/2020   Procedure: LAPAROSCOPIC CHOLECYSTECTOMY;  Surgeon: Fredirick Maudlin, MD;  Location: [facility] ORS;  Service: General;  Laterality: N/A;   COLONOSCOPY WITH PROPOFOL N/A 07/09/2015   Procedure: COLONOSCOPY WITH PROPOFOL;  Surgeon: Manya Silvas, MD;  Location: [facility] ENDOSCOPY;  Service: Endoscopy;  Laterality: N/A;    Medical History: Past Medical History:  Diagnosis Date   Arthritis    Asthma    Cancer ([place])    skin, colon polyp   Chronic kidney disease    Complication of anesthesia    asthma attack per pt  COPD (chronic obstructive pulmonary disease) (HCC)    Dyspnea    Hypertension    Hypothyroidism     Family History: Family History  Problem Relation Age of Onset   Heart attack Father    Early death Sister        MVA   Stomach cancer Brother    Cancer Brother        shoulder   Lung cancer Brother    Cancer Sister    Cancer Sister    Early death Sister        husband killed her   Breast cancer Neg Hx     Social History: Social  History   Socioeconomic History   Marital status: Widowed    Spouse name: Not on file   Number of children: Not on file   Years of education: Not on file   Highest education level: Not on file  Occupational History   Not on file  Tobacco Use   Smoking status: Never   Smokeless tobacco: Never   Tobacco comments:    second hand smoke  Vaping Use   Vaping Use: Never used  Substance and Sexual Activity   Alcohol use: No   Drug use: No   Sexual activity: Not on file  Other Topics Concern   Not on file  Social History Narrative   Not on file   Social Determinants of Health   Financial Resource Strain: Low Risk    Difficulty of Paying Living Expenses: Not very hard  Food Insecurity: Not on file  Transportation Needs: Not on file  Physical Activity: Not on file  Stress: Not on file  Social Connections: Not on file  Intimate Partner Violence: Not on file    Vital Signs: Blood pressure (!) 110/58, pulse 70, temperature 98 F (36.7 C), resp. rate 16, height 4\' 8"  (1.422 m), weight 130 lb (59 kg), SpO2 93 %.  Examination: General Appearance: The patient is well-developed, well-nourished, and in no distress. Skin: Gross inspection of skin unremarkable. Head: normocephalic, no gross deformities. Eyes: no gross deformities noted. ENT: ears appear grossly normal no exudates. Neck: Supple. No thyromegaly. No LAD. Respiratory: few rhonchi noted. Cardiovascular: Normal S1 and S2 without murmur or rub. Extremities: No cyanosis. pulses are equal. Neurologic: Alert and oriented. No involuntary movements.  LABS: No results found for this or any previous visit (from the past 2160 hour(s)).  Radiology: LONG TERM MONITOR (3-14 DAYS)  Result Date: 07/30/2020 Patch Wear Time:  13 days and 17 hours (2022-01-07T14:51:14-0500 to 2022-01-21T08:32:08-498) Patient had a min HR of 52 bpm, max HR of 162 bpm, and avg HR of 62 bpm. Predominant underlying rhythm was Sinus Rhythm. No evidence of  atrial fibrillation or atrial flutter noted on cardiac monitor.   No results found.  No results found.    Assessment and Plan: Patient Active Problem List   Diagnosis Date Noted   Generalized abdominal pain 12/30/2019   Stage 3 chronic kidney disease ([place]) 06/30/2019   Urinary tract infection with hematuria 12/20/2018   Low back pain 12/20/2018   Bilateral carotid artery stenosis 06/19/2018   Cellulitis of finger of right hand 03/13/2018   Encounter for screening mammogram for malignant neoplasm of breast 12/18/2017   Vitamin D deficiency 12/18/2017   Dysuria 12/18/2017   Chronic obstructive pulmonary disease ([place]) 08/17/2017   SOB (shortness of breath) 08/17/2017   GERD (gastroesophageal reflux disease) 08/17/2017   Hyperlipidemia 08/17/2017   Hypertension 08/17/2017   Hypothyroidism 08/17/2017  Osteoarthritis 08/17/2017   1. OSA (obstructive sleep apnea) She has been using the PAP as precribed. My concern is over the not using oxygen will get this checked with overnight ox - Pulse oximetry, overnight; Future  2. Obstructive chronic bronchitis without exacerbation (HCC)  - budesonide-formoterol (SYMBICORT) 160-4.5 MCG/ACT inhaler; Inhale 2 puffs into the lungs 2 (two) times daily.  Dispense: 1 each; Refill: 3 - albuterol (VENTOLIN HFA) 108 (90 Base) MCG/ACT inhaler; Inhale 2 puffs into the lungs every 6 (six) hours as needed for wheezing or shortness of breath.  Dispense: 8 g; Refill: 0 - Pulse oximetry, overnight; Future  3. CPAP use counseling CPAP Counseling: had a lengthy discussion with the patient regarding the importance of PAP therapy in management of the sleep apnea. Patient appears to understand the risk factor reduction and also understands the risks associated with untreated sleep apnea. Patient will try to make a good faith effort to remain compliant with therapy. Also instructed the patient on proper cleaning of the device including the water must be changed  daily if possible and use of distilled water is preferred. Patient understands that the machine should be regularly cleaned with appropriate recommended cleaning solutions that do not damage the PAP machine for example given white vinegar and water rinses. Other methods such as ozone treatment [place] not be as good as these simple methods to achieve cleaning.   4. Shortness of breath She will get overnight pulsox and also will do a 6 min walk - Pulse oximetry, overnight; Future   General Counseling: I have discussed the findings of the evaluation and examination with [facility].  I have also discussed any further diagnostic evaluation thatmay be needed or ordered today. Makela verbalizes understanding of the findings of todays visit. We also reviewed her medications today and discussed drug interactions and side effects including but not limited excessive drowsiness and altered mental states. We also discussed that there is always a risk not just to her but also people around her. she has been encouraged to call the office with any questions or concerns that should arise related to todays visit.  No orders of the defined types were placed in this encounter.    Time spent: 65  I have personally obtained a history, examined the patient, evaluated laboratory and imaging results, formulated the assessment and plan and placed orders.    Allyne Gee, MD [facility] Pulmonary and Critical Care Sleep medicine

## 2021-08-09 ENCOUNTER — Ambulatory Visit: Payer: PPO | Admitting: Cardiology

## 2021-08-09 ENCOUNTER — Encounter: Payer: Self-pay | Admitting: Cardiology

## 2021-08-09 VITALS — BP 126/56 | HR 65 | Ht <= 58 in | Wt 130.0 lb

## 2021-08-09 DIAGNOSIS — I5189 Other ill-defined heart diseases: Secondary | ICD-10-CM

## 2021-08-09 DIAGNOSIS — I350 Nonrheumatic aortic (valve) stenosis: Secondary | ICD-10-CM | POA: Diagnosis not present

## 2021-08-09 DIAGNOSIS — R0609 Other forms of dyspnea: Secondary | ICD-10-CM | POA: Diagnosis not present

## 2021-08-09 DIAGNOSIS — I10 Essential (primary) hypertension: Secondary | ICD-10-CM | POA: Diagnosis not present

## 2021-08-09 MED ORDER — CARVEDILOL 25 MG PO TABS: 12.5000 mg | ORAL_TABLET | Freq: Two times a day (BID) | ORAL | 3 refills | Status: DC

## 2021-08-09 MED ORDER — FUROSEMIDE 20 MG PO TABS: 20.0000 mg | ORAL_TABLET | Freq: Every day | ORAL | 3 refills | Status: DC

## 2021-08-09 NOTE — Progress Notes (Signed)
Cardiology Office Note:    Date:  08/09/2021   ID:  Brenda Tucker, DOB 1934/02/21, MRN [id number]  PCP:  Mylinda Latina, PA-C  [facility] Cardiologist:  Kate Sable, MD  [place] Electrophysiologist:  None   Referring MD: Mylinda Latina, PA*   Chief Complaint  Patient presents with   Other    6 month follow up -- Patient c.o worsening SOB and swelling in ankles. Meds reviewed verbally with patient.     History of Present Illness:    Brenda Tucker is a 88 y.o. female with a hx of hypertension, hyperlipidemia, CKD 3, COPD, OSA, never smoker who presents for follow-up.   Being seen due to diastolic dysfunction and hypertension.  States having worsening shortness of breath, saw pulmonary medicine yesterday, noted to be hypoxic with oxygen sats in the 80s.  Planning on starting oxygen around-the-clock as per pulm.  Has occasional cough, recently told she has stage IV COPD.  States having shortness of breath with ordinary walking.  Trace edema in the lower extremities, granddaughter recently pinched her right leg causing a scar.   Prior notes Echo 07/37/1062 normal systolic function, grade 2 diastolic dysfunction, EF 55 to 60% Lexiscan Myoview 07/2020, no evidence for ischemia, coronary artery calcifications in the LAD, RCA. Outside echocardiogram on 01/2018 showed normal systolic function, EF 69%, aortic valve calcification. Stop Lipitor in the past due to diarrhea. Episode of A. fib in the context of Lexiscan usage.  Follow-up cardiac monitor with no recurrence of A. fib.  Past Medical History:  Diagnosis Date   Arthritis    Asthma    Cancer ([place])    skin, colon polyp   Chronic kidney disease    Complication of anesthesia    asthma attack per pt   COPD (chronic obstructive pulmonary disease) ([place])    Dyspnea    Hypertension    Hypothyroidism     Past Surgical History:  Procedure Laterality Date   ABDOMINAL HYSTERECTOMY     APPENDECTOMY      CHOLECYSTECTOMY N/A 03/12/2020   Procedure: LAPAROSCOPIC CHOLECYSTECTOMY;  Surgeon: Fredirick Maudlin, MD;  Location: [facility] ORS;  Service: General;  Laterality: N/A;   COLONOSCOPY WITH PROPOFOL N/A 07/09/2015   Procedure: COLONOSCOPY WITH PROPOFOL;  Surgeon: Manya Silvas, MD;  Location: [place];  Service: Endoscopy;  Laterality: N/A;    Current Medications: Current Meds  Medication Sig   acetaminophen (TYLENOL) 500 MG tablet Take 500 mg by mouth every 6 (six) hours as needed for moderate pain or headache.   albuterol (VENTOLIN HFA) 108 (90 Base) MCG/ACT inhaler Inhale 2 puffs into the lungs every 6 (six) hours as needed for wheezing or shortness of breath.   amLODipine (NORVASC) 5 MG tablet TAKE 1 TABLET(5 MG) BY MOUTH DAILY   aspirin EC 81 MG tablet Take 81 mg by mouth daily.   atorvastatin (LIPITOR) 10 MG tablet Take 1 tablet by mouth every other day.   budesonide-formoterol (SYMBICORT) 160-4.5 MCG/ACT inhaler Inhale 2 puffs into the lungs 2 (two) times daily.   calcium carbonate (OSCAL) 1500 (600 Ca) MG TABS tablet Take 600 mg of elemental calcium by mouth daily.   Cholecalciferol (VITAMIN D) 50 MCG (2000 UT) tablet Take 2,000 Units by mouth daily.   latanoprost (XALATAN) 0.005 % ophthalmic solution Place 1 drop into both eyes at bedtime.   levothyroxine (SYNTHROID) 50 MCG tablet Take 1 tablet (50 mcg total) by mouth daily before breakfast.   losartan (COZAAR) 100 MG  tablet Take 1 tablet (100 mg total) by mouth daily.   vitamin B-12 (CYANOCOBALAMIN) 1000 MCG tablet Take 1,000 mcg by mouth daily.   [DISCONTINUED] carvedilol (COREG) 25 MG tablet TAKE 1 TABLET(25 MG) BY MOUTH TWICE DAILY   [DISCONTINUED] furosemide (LASIX) 20 MG tablet Take 1 tablet (20 mg total) by mouth daily as needed (for weight gain greater than 3 pounds overnight and 5 pounds in 1 week.).     Allergies:   Theodrenaline   Social History   Socioeconomic History   Marital status: Widowed    Spouse name: Not on  file   Number of children: Not on file   Years of education: Not on file   Highest education level: Not on file  Occupational History   Not on file  Tobacco Use   Smoking status: Never   Smokeless tobacco: Never   Tobacco comments:    second hand smoke  Vaping Use   Vaping Use: Never used  Substance and Sexual Activity   Alcohol use: No   Drug use: No   Sexual activity: Not on file  Other Topics Concern   Not on file  Social History Narrative   Not on file   Social Determinants of Health   Financial Resource Strain: Low Risk    Difficulty of Paying Living Expenses: Not very hard  Food Insecurity: Not on file  Transportation Needs: Not on file  Physical Activity: Not on file  Stress: Not on file  Social Connections: Not on file     Family History: The patient's family history includes Cancer in her brother, sister, and sister; Early death in her sister and sister; Heart attack in her father; Lung cancer in her brother; Stomach cancer in her brother. There is no history of Breast cancer.  ROS:   Please see the history of present illness.     All other systems reviewed and are negative.  EKGs/Labs/Other Studies Reviewed:    The following studies were reviewed today:   EKG:  EKG is ordered today.  EKG shows normal sinus rhythm, bifascicular block  Recent Labs: No results found for requested labs within last 8760 hours.  Recent Lipid Panel    Component Value Date/Time   CHOL 107 07/16/2020 1148   CHOL 212 (H) 12/31/2019 0853   TRIG 89 07/16/2020 1148   HDL 56 07/16/2020 1148   HDL 60 12/31/2019 0853   CHOLHDL 1.9 07/16/2020 1148   VLDL 18 07/16/2020 1148   LDLCALC 33 07/16/2020 1148   LDLCALC 130 (H) 12/31/2019 0853     Risk Assessment/Calculations:      Physical Exam:    VS:  BP (!) 126/56 (BP Location: Left Arm, Patient Position: Sitting, Cuff Size: Normal)    Pulse 65    Ht 4\' 8"  (1.422 m)    Wt 130 lb (59 kg)    SpO2 96%    BMI 29.15 kg/m     Wt  Readings from Last 3 Encounters:  08/09/21 130 lb (59 kg)  08/08/21 130 lb (59 kg)  05/20/21 132 lb (59.9 kg)     GEN:  Well nourished, well developed in no acute distress HEENT: Normal NECK: No JVD; No carotid bruits LYMPHATICS: No lymphadenopathy CARDIAC: RRR, 2/6 systolic murmur RESPIRATORY:  Clear to auscultation without rales, wheezing or rhonchi  ABDOMEN: Soft, non-tender, non-distended MUSCULOSKELETAL:  1+ edema; No deformity  SKIN: Warm and dry NEUROLOGIC:  Alert and oriented x 3 PSYCHIATRIC:  Normal affect   ASSESSMENT:  1. Aortic valve stenosis, etiology of cardiac valve disease unspecified   2. Primary hypertension   3. Diastolic dysfunction   4. Dyspnea on exertion    PLAN:    In order of problems listed above:  History of hypertension, BP slightly low.  CKD 3 patient.  Reduce Coreg to 12.5 mg twice daily, continue losartan, Norvasc.   Mild to moderate aortic valve stenosis,.  Plan to Monitor with serial echocardiograms.  Repeat echo in 06/2022. Mild edema, grade 2 diastolic dysfunction, cough.  Continue Lasix 20 mg daily . Dyspnea on exertion, chronic problem.  COPD now stage IV, hypoxia.  Etiology multifactorial including copd, osa, diastolic dysfunction. Cont management of G2DD as above, copd and osa as per pulm and sleep specialist.  Follow-up in 6 weeks.   Shared Decision Making/Informed Consent      Medication Adjustments/Labs and Tests Ordered: Current medicines are reviewed at length with the patient today.  Concerns regarding medicines are outlined above.  Orders Placed This Encounter  Procedures   Basic metabolic panel   EKG 30-ZSWF    Meds ordered this encounter  Medications   carvedilol (COREG) 25 MG tablet    Sig: Take 0.5 tablets (12.5 mg total) by mouth 2 (two) times daily with a meal.    Dispense:  180 tablet    Refill:  3   furosemide (LASIX) 20 MG tablet    Sig: Take 1 tablet (20 mg total) by mouth daily.    Dispense:  30 tablet     Refill:  3     Patient Instructions  Medication Instructions:   Your physician has recommended you make the following change in your medication:    Take your Furosemide (Lasix) 20 MG once every day.  2.    DECREASE your Carvedilol (Coreg) to 12.5 MG twice a day. (You will have to break your tablet in half)  *If you need a refill on your cardiac medications before your next appointment, please call your pharmacy*   Lab Work:  Your physician recommends that you return for lab work (BMP) in: 1 week  Please return to our office on_____________________at______________am/pm  If you have labs (blood work) drawn today and your tests are completely normal, you will receive your results only by: [place] (if you have MyChart) OR A paper copy in the mail If you have any lab test that is abnormal or we need to change your treatment, we will call you to review the results.   Testing/Procedures:  None Ordered   Follow-Up: At [facility], you and your health needs are our priority.  As part of our continuing mission to provide you with exceptional heart care, we have created designated Provider Care Teams.  These Care Teams include your primary Cardiologist (physician) and Advanced Practice Providers (APPs -  Physician Assistants and Nurse Practitioners) who all work together to provide you with the care you need, when you need it.  We recommend signing up for the patient portal called "MyChart".  Sign up information is provided on this After Visit Summary.  MyChart is used to connect with patients for Virtual Visits (Telemedicine).  Patients are able to view lab/test results, encounter notes, upcoming appointments, etc.  Non-urgent messages can be sent to your provider as well.   To learn more about what you can do with MyChart, go to [email or web address].    Your next appointment:   4-6 week follow up    The format for your next appointment:  In Person  Provider:   ONLY with  Kate Sable, MD       Signed, Kate Sable, MD  08/09/2021 10:42 AM    [place]

## 2021-08-09 NOTE — Patient Instructions (Signed)
Medication Instructions:   Your physician has recommended you make the following change in your medication:    Take your Furosemide (Lasix) 20 MG once every day.  2.    DECREASE your Carvedilol (Coreg) to 12.5 MG twice a day. (You will have to break your tablet in half)  *If you need a refill on your cardiac medications before your next appointment, please call your pharmacy*   Lab Work:  Your physician recommends that you return for lab work (BMP) in: 1 week  Please return to our office on_____________________at______________am/pm  If you have labs (blood work) drawn today and your tests are completely normal, you will receive your results only by: [place] (if you have MyChart) OR A paper copy in the mail If you have any lab test that is abnormal or we need to change your treatment, we will call you to review the results.   Testing/Procedures:  None Ordered   Follow-Up: At [facility], you and your health needs are our priority.  As part of our continuing mission to provide you with exceptional heart care, we have created designated Provider Care Teams.  These Care Teams include your primary Cardiologist (physician) and Advanced Practice Providers (APPs -  Physician Assistants and Nurse Practitioners) who all work together to provide you with the care you need, when you need it.  We recommend signing up for the patient portal called "MyChart".  Sign up information is provided on this After Visit Summary.  MyChart is used to connect with patients for Virtual Visits (Telemedicine).  Patients are able to view lab/test results, encounter notes, upcoming appointments, etc.  Non-urgent messages can be sent to your provider as well.   To learn more about what you can do with MyChart, go to [email or web address].    Your next appointment:   4-6 week follow up    The format for your next appointment:   In Person  Provider:  ONLY with  Kate Sable, MD

## 2021-08-10 DIAGNOSIS — J449 Chronic obstructive pulmonary disease, unspecified: Secondary | ICD-10-CM | POA: Diagnosis not present

## 2021-08-11 ENCOUNTER — Encounter: Payer: Self-pay | Admitting: Internal Medicine

## 2021-08-15 DIAGNOSIS — D631 Anemia in chronic kidney disease: Secondary | ICD-10-CM | POA: Diagnosis not present

## 2021-08-15 DIAGNOSIS — N1832 Chronic kidney disease, stage 3b: Secondary | ICD-10-CM | POA: Diagnosis not present

## 2021-08-15 DIAGNOSIS — I129 Hypertensive chronic kidney disease with stage 1 through stage 4 chronic kidney disease, or unspecified chronic kidney disease: Secondary | ICD-10-CM | POA: Diagnosis not present

## 2021-08-15 DIAGNOSIS — N2581 Secondary hyperparathyroidism of renal origin: Secondary | ICD-10-CM | POA: Diagnosis not present

## 2021-08-16 ENCOUNTER — Other Ambulatory Visit (INDEPENDENT_AMBULATORY_CARE_PROVIDER_SITE_OTHER): Payer: PPO

## 2021-08-16 ENCOUNTER — Other Ambulatory Visit: Payer: Self-pay

## 2021-08-16 DIAGNOSIS — I5189 Other ill-defined heart diseases: Secondary | ICD-10-CM | POA: Diagnosis not present

## 2021-08-17 ENCOUNTER — Telehealth: Payer: Self-pay

## 2021-08-17 LAB — BASIC METABOLIC PANEL
BUN/Creatinine Ratio: 16 (ref 12–28)
BUN: 22 mg/dL (ref 8–27)
CO2: 27 mmol/L (ref 20–29)
Calcium: 8.8 mg/dL (ref 8.7–10.3)
Chloride: 98 mmol/L (ref 96–106)
Creatinine, Ser: 1.36 mg/dL — ABNORMAL HIGH (ref 0.57–1.00)
Glucose: 184 mg/dL — ABNORMAL HIGH (ref 70–99)
Potassium: 3.8 mmol/L (ref 3.5–5.2)
Sodium: 138 mmol/L (ref 134–144)
eGFR: 38 mL/min/{1.73_m2} — ABNORMAL LOW (ref >59)

## 2021-08-17 NOTE — Telephone Encounter (Signed)
Oxygen therapy order signed by provider and placed in [facility] folder. 

## 2021-08-25 ENCOUNTER — Other Ambulatory Visit: Payer: Self-pay | Admitting: Internal Medicine

## 2021-08-26 DIAGNOSIS — J449 Chronic obstructive pulmonary disease, unspecified: Secondary | ICD-10-CM | POA: Diagnosis not present

## 2021-08-30 ENCOUNTER — Telehealth: Payer: Self-pay

## 2021-08-30 NOTE — Telephone Encounter (Signed)
Verbal order for portable oxygen signed by provider and placed in [facility] folder.

## 2021-08-31 ENCOUNTER — Other Ambulatory Visit: Payer: Self-pay | Admitting: Internal Medicine

## 2021-08-31 DIAGNOSIS — J449 Chronic obstructive pulmonary disease, unspecified: Secondary | ICD-10-CM

## 2021-09-01 ENCOUNTER — Telehealth: Payer: Self-pay

## 2021-09-01 NOTE — Telephone Encounter (Signed)
Received paper that insurance didn't cover Symbicort under part D.  I called pharmacy spoke to Taren at [facility] and she advised that the copay for inhaler was $10.35. ?

## 2021-09-07 DIAGNOSIS — G4733 Obstructive sleep apnea (adult) (pediatric): Secondary | ICD-10-CM | POA: Diagnosis not present

## 2021-09-07 DIAGNOSIS — J449 Chronic obstructive pulmonary disease, unspecified: Secondary | ICD-10-CM | POA: Diagnosis not present

## 2021-09-09 ENCOUNTER — Other Ambulatory Visit: Payer: Self-pay

## 2021-09-09 ENCOUNTER — Ambulatory Visit (INDEPENDENT_AMBULATORY_CARE_PROVIDER_SITE_OTHER): Payer: PPO | Admitting: Physician Assistant

## 2021-09-09 ENCOUNTER — Encounter: Payer: Self-pay | Admitting: Physician Assistant

## 2021-09-09 ENCOUNTER — Telehealth: Payer: Self-pay

## 2021-09-09 DIAGNOSIS — I10 Essential (primary) hypertension: Secondary | ICD-10-CM | POA: Diagnosis not present

## 2021-09-09 DIAGNOSIS — B3789 Other sites of candidiasis: Secondary | ICD-10-CM | POA: Diagnosis not present

## 2021-09-09 DIAGNOSIS — G4733 Obstructive sleep apnea (adult) (pediatric): Secondary | ICD-10-CM

## 2021-09-09 DIAGNOSIS — E782 Mixed hyperlipidemia: Secondary | ICD-10-CM

## 2021-09-09 DIAGNOSIS — N1832 Chronic kidney disease, stage 3b: Secondary | ICD-10-CM

## 2021-09-09 DIAGNOSIS — J449 Chronic obstructive pulmonary disease, unspecified: Secondary | ICD-10-CM | POA: Diagnosis not present

## 2021-09-09 MED ORDER — NYSTATIN-TRIAMCINOLONE 100000-0.1 UNIT/GM-% EX OINT: 1.0000 "application " | TOPICAL_OINTMENT | Freq: Two times a day (BID) | CUTANEOUS | 0 refills | Status: DC

## 2021-09-09 NOTE — Telephone Encounter (Signed)
Order for portable oxygen signed by provider and placed in [place] folder. ?

## 2021-09-09 NOTE — Progress Notes (Cosign Needed)
[facility] [place], [place] [address]  Internal MEDICINE  Office Visit Note  Patient Name: Brenda Tucker  [id number]  [id number]  Date of Service: 09/09/2021  Chief Complaint  Patient presents with   Follow-up   Hypertension    HPI Pt is here for routine follow up -BP well controlled in the AM does rise some in evenings. Takes her second 1/2 tab coreg and her losartan in evening.  -Saw nephrology last month-no changes, sees cardiology on the 20th -trouble with getting inhalers due to insurance not covering, but upon review it appears she is able to get symbicort with a copay of ~$10 per pharmacy. She states she does not need refill yet but understands she can get this when needed.  -She is wearing oxygen continuously now even though she does not like carrying tank with her.  -Wearing CPAP at night and unclear if/when ONO ordered by pulmonology will be done to check O2 levels at night on CPAP. -Does have a rash under breasts that she says occurs when she does not wear a bra at home and sweats. Discussed this is likely candidiasis and to keep area clean and dry and [place] use topical ointment  Current Medication: Outpatient Encounter Medications as of 09/09/2021  Medication Sig   acetaminophen (TYLENOL) 500 MG tablet Take 500 mg by mouth every 6 (six) hours as needed for moderate pain or headache.   albuterol (VENTOLIN HFA) 108 (90 Base) MCG/ACT inhaler INHALE 2 PUFFS INTO THE LUNGS EVERY 6 HOURS AS NEEDED FOR WHEEZING OR SHORTNESS OF BREATH   amLODipine (NORVASC) 5 MG tablet TAKE 1 TABLET(5 MG) BY MOUTH DAILY   aspirin EC 81 MG tablet Take 81 mg by mouth daily.   atorvastatin (LIPITOR) 10 MG tablet Take 1 tablet by mouth every other day.   budesonide-formoterol (SYMBICORT) 160-4.5 MCG/ACT inhaler Inhale 2 puffs into the lungs 2 (two) times daily.   calcium carbonate (OSCAL) 1500 (600 Ca) MG TABS tablet Take 600 mg of elemental calcium by mouth daily.   carvedilol  (COREG) 25 MG tablet Take 0.5 tablets (12.5 mg total) by mouth 2 (two) times daily with a meal.   Cholecalciferol (VITAMIN D) 50 MCG (2000 UT) tablet Take 2,000 Units by mouth daily.   furosemide (LASIX) 20 MG tablet Take 1 tablet (20 mg total) by mouth daily.   latanoprost (XALATAN) 0.005 % ophthalmic solution Place 1 drop into both eyes at bedtime.   levothyroxine (SYNTHROID) 50 MCG tablet Take 1 tablet (50 mcg total) by mouth daily before breakfast.   losartan (COZAAR) 100 MG tablet Take 1 tablet (100 mg total) by mouth daily.   nystatin-triamcinolone ointment (MYCOLOG) Apply 1 application. topically 2 (two) times daily.   vitamin B-12 (CYANOCOBALAMIN) 1000 MCG tablet Take 1,000 mcg by mouth daily.   [DISCONTINUED] fluticasone-salmeterol (ADVAIR DISKUS) 250-50 MCG/ACT AEPB INHALE 1 PUFF INTO THE LUNGS TWICE DAILY   No facility-administered encounter medications on file as of 09/09/2021.    Surgical History: Past Surgical History:  Procedure Laterality Date   ABDOMINAL HYSTERECTOMY     APPENDECTOMY     CHOLECYSTECTOMY N/A 03/12/2020   Procedure: LAPAROSCOPIC CHOLECYSTECTOMY;  Surgeon: Fredirick Maudlin, MD;  Location: [facility] ORS;  Service: General;  Laterality: N/A;   COLONOSCOPY WITH PROPOFOL N/A 07/09/2015   Procedure: COLONOSCOPY WITH PROPOFOL;  Surgeon: Manya Silvas, MD;  Location: [facility] ENDOSCOPY;  Service: Endoscopy;  Laterality: N/A;    Medical History: Past Medical History:  Diagnosis Date  Arthritis    Asthma    Cancer ([place])    skin, colon polyp   Chronic kidney disease    Complication of anesthesia    asthma attack per pt   COPD (chronic obstructive pulmonary disease) ([place])    Dyspnea    Hypertension    Hypothyroidism     Family History: Family History  Problem Relation Age of Onset   Heart attack Father    Early death Sister        MVA   Stomach cancer Brother    Cancer Brother        shoulder   Lung cancer Brother    Cancer Sister    Cancer Sister     Early death Sister        husband killed her   Breast cancer Neg Hx     Social History   Socioeconomic History   Marital status: Widowed    Spouse name: Not on file   Number of children: Not on file   Years of education: Not on file   Highest education level: Not on file  Occupational History   Not on file  Tobacco Use   Smoking status: Never   Smokeless tobacco: Never   Tobacco comments:    second hand smoke  Vaping Use   Vaping Use: Never used  Substance and Sexual Activity   Alcohol use: No   Drug use: No   Sexual activity: Not on file  Other Topics Concern   Not on file  Social History Narrative   Not on file   Social Determinants of Health   Financial Resource Strain: Low Risk    Difficulty of Paying Living Expenses: Not very hard  Food Insecurity: Not on file  Transportation Needs: Not on file  Physical Activity: Not on file  Stress: Not on file  Social Connections: Not on file  Intimate Partner Violence: Not on file      Review of Systems  Constitutional:  Negative for chills, fatigue and unexpected weight change.  HENT:  Negative for congestion, postnasal drip, rhinorrhea, sneezing and sore throat.   Eyes:  Negative for redness.  Respiratory:  Positive for shortness of breath. Negative for cough, chest tightness and wheezing.   Cardiovascular:  Negative for chest pain and palpitations.  Gastrointestinal:  Negative for abdominal pain, constipation, diarrhea, nausea and vomiting.  Genitourinary:  Negative for dysuria and frequency.  Musculoskeletal:  Positive for arthralgias. Negative for back pain, joint swelling and neck pain.  Skin:  Negative for rash.  Neurological: Negative.  Negative for tremors and numbness.  Hematological:  Negative for adenopathy. Does not bruise/bleed easily.  Psychiatric/Behavioral:  Negative for behavioral problems (Depression), sleep disturbance and suicidal ideas. The patient is not nervous/anxious.    Vital Signs: BP  (!) 125/57    Pulse 71    Temp 98 F (36.7 C)    Resp 16    Ht '4\' 8"'$  (1.422 m)    Wt 130 lb (59 kg)    SpO2 93%    BMI 29.15 kg/m    Physical Exam Vitals and nursing note reviewed.  Constitutional:      General: She is not in acute distress.    Appearance: She is well-developed. She is not diaphoretic.  HENT:     Head: Normocephalic and atraumatic.     Mouth/Throat:     Pharynx: No oropharyngeal exudate.  Eyes:     Pupils: Pupils are equal, round, and reactive to light.  Neck:     Thyroid: No thyromegaly.     Vascular: No JVD.     Trachea: No tracheal deviation.  Cardiovascular:     Rate and Rhythm: Normal rate and regular rhythm.     Heart sounds: Normal heart sounds. No murmur heard.   No friction rub. No gallop.  Pulmonary:     Effort: Pulmonary effort is normal. No respiratory distress.     Breath sounds: Wheezing present. No rales.  Chest:     Chest wall: No tenderness.  Abdominal:     General: Bowel sounds are normal.     Palpations: Abdomen is soft.  Musculoskeletal:        General: Normal range of motion.     Cervical back: Normal range of motion and neck supple.     Right lower leg: No edema.     Left lower leg: No edema.  Lymphadenopathy:     Cervical: No cervical adenopathy.  Skin:    General: Skin is warm and dry.  Neurological:     Mental Status: She is alert and oriented to person, place, and time.     Cranial Nerves: No cranial nerve deficit.  Psychiatric:        Behavior: Behavior normal.        Thought Content: Thought content normal.        Judgment: Judgment normal.       Assessment/Plan: 1. Essential hypertension Stable, continue current medications  2. Mixed hyperlipidemia Continue Lipitor  3. Candidiasis of breast Advised to keep area clean and dry and [place] apply Mycolog ointment as needed - nystatin-triamcinolone ointment (MYCOLOG); Apply 1 application. topically 2 (two) times daily.  Dispense: 30 g; Refill: 0  4. Stage 3b chronic  kidney disease ([place]) Followed by nephrology  5. OSA (obstructive sleep apnea) Continue CPAP nightly, followed by pulmonology  6. Obstructive chronic bronchitis without exacerbation ([place]) Continue inhalers as prescribed and as indicated.  Also continue to use oxygen as prescribed.  Followed by pulmonology   General Counseling: Torrence verbalizes understanding of the findings of todays visit and agrees with plan of treatment. I have discussed any further diagnostic evaluation that [place] be needed or ordered today. We also reviewed her medications today. she has been encouraged to call the office with any questions or concerns that should arise related to todays visit.    No orders of the defined types were placed in this encounter.   Meds ordered this encounter  Medications   nystatin-triamcinolone ointment (MYCOLOG)    Sig: Apply 1 application. topically 2 (two) times daily.    Dispense:  30 g    Refill:  0    This patient was seen by Drema Dallas, PA-C in collaboration with Dr. Clayborn Bigness as a part of collaborative care agreement.   Total time spent:30 Minutes Time spent includes review of chart, medications, test results, and follow up plan with the patient.      Dr Lavera Guise Internal medicine

## 2021-09-19 ENCOUNTER — Encounter: Payer: Self-pay | Admitting: Cardiology

## 2021-09-19 ENCOUNTER — Other Ambulatory Visit: Payer: Self-pay

## 2021-09-19 ENCOUNTER — Ambulatory Visit: Payer: PPO | Admitting: Cardiology

## 2021-09-19 VITALS — BP 112/60 | HR 70 | Ht <= 58 in | Wt 129.0 lb

## 2021-09-19 DIAGNOSIS — I5189 Other ill-defined heart diseases: Secondary | ICD-10-CM | POA: Diagnosis not present

## 2021-09-19 DIAGNOSIS — I10 Essential (primary) hypertension: Secondary | ICD-10-CM | POA: Diagnosis not present

## 2021-09-19 DIAGNOSIS — I350 Nonrheumatic aortic (valve) stenosis: Secondary | ICD-10-CM

## 2021-09-19 NOTE — Patient Instructions (Signed)
Medication Instructions:  ? ?Your physician recommends that you continue on your current medications as directed. Please refer to the Current Medication list given to you today. ? ?*If you need a refill on your cardiac medications before your next appointment, please call your pharmacy* ? ? ?Lab Work: ?None ordered ?If you have labs (blood work) drawn today and your tests are completely normal, you will receive your results only by: ?MyChart Message (if you have MyChart) OR ?A paper copy in the mail ?If you have any lab test that is abnormal or we need to change your treatment, we will call you to review the results. ? ? ?Testing/Procedures: ? ?Your physician has requested that you have an echocardiogram in December 2023. Echocardiography is a painless test that uses sound waves to create images of your heart. It provides your doctor with information about the size and shape of your heart and how well your heart?s chambers and valves are working. This procedure takes approximately one hour. There are no restrictions for this procedure. ? ? ? ?Follow-Up: ?At [facility], you and your health needs are our priority.  As part of our continuing mission to provide you with exceptional heart care, we have created designated Provider Care Teams.  These Care Teams include your primary Cardiologist (physician) and Advanced Practice Providers (APPs -  Physician Assistants and Nurse Practitioners) who all work together to provide you with the care you need, when you need it. ? ?We recommend signing up for the patient portal called "MyChart".  Sign up information is provided on this After Visit Summary.  MyChart is used to connect with patients for Virtual Visits (Telemedicine).  Patients are able to view lab/test results, encounter notes, upcoming appointments, etc.  Non-urgent messages can be sent to your provider as well.   ?To learn more about what you can do with MyChart, go to [email or web address].   ? ?Your next  appointment:   ?December 2023 After Echo.  ? ?The format for your next appointment:   ?In Person ? ?Provider:   ?Kate Sable, MD  ? ? ?Other Instructions ? ? ?

## 2021-09-19 NOTE — Progress Notes (Signed)
?Cardiology Office Note:   ? ?Date:  09/19/2021  ? ?ID:  Brenda Tucker, DOB 1933-07-17, MRN [id number] ? ?PCP:  Mylinda Latina, PA-C  ?[facility] Cardiologist:  Kate Sable, MD  ?[facility] Electrophysiologist:  None  ? ?Referring MD: Mylinda Latina, PA*  ? ?Chief Complaint  ?Patient presents with  ? Other  ?  6 week follow up -- Meds reviewed verbally with patient.   ? ? ?History of Present Illness:   ? ?Brenda Tucker is a 88 y.o. female with a hx of HFpEF, aortic stenosis mild, hypertension, hyperlipidemia, CKD 3, COPD stage IV, OSA, never smoker who presents for follow-up.  ? ?Patient is being seen for HFpEF.  Blood pressure was previously on the low side.  Coreg decreased to 12.5 mg twice daily.  Blood pressures have been normal.  She states feeling well, saw pulmonary medicine was started on oxygen.  Has COPD stage IV.  No edema. ? ? ?Prior notes ?Echo 82/42/3536 normal systolic function, grade 2 diastolic dysfunction, EF 55 to 60% ?[place] 07/2020, no evidence for ischemia, coronary artery calcifications in the LAD, RCA. ?Outside echocardiogram on 01/2018 showed normal systolic function, EF 14%, aortic valve calcification. ?Stop Lipitor in the past due to diarrhea. ?Episode of A. fib in the context of Lexiscan usage.  Follow-up cardiac monitor with no recurrence of A. fib. ? ?Past Medical History:  ?Diagnosis Date  ? Arthritis   ? Asthma   ? Cancer [facility])   ? skin, colon polyp  ? Chronic kidney disease   ? Complication of anesthesia   ? asthma attack per pt  ? COPD (chronic obstructive pulmonary disease) ([place])   ? Dyspnea   ? Hypertension   ? Hypothyroidism   ? ? ?Past Surgical History:  ?Procedure Laterality Date  ? ABDOMINAL HYSTERECTOMY    ? APPENDECTOMY    ? CHOLECYSTECTOMY N/A 03/12/2020  ? Procedure: LAPAROSCOPIC CHOLECYSTECTOMY;  Surgeon: Fredirick Maudlin, MD;  Location: [facility] ORS;  Service: General;  Laterality: N/A;  ? COLONOSCOPY WITH PROPOFOL N/A 07/09/2015  ? Procedure:  COLONOSCOPY WITH PROPOFOL;  Surgeon: Manya Silvas, MD;  Location: [facility] ENDOSCOPY;  Service: Endoscopy;  Laterality: N/A;  ? ? ?Current Medications: ?Current Meds  ?Medication Sig  ? acetaminophen (TYLENOL) 500 MG tablet Take 500 mg by mouth every 6 (six) hours as needed for moderate pain or headache.  ? albuterol (VENTOLIN HFA) 108 (90 Base) MCG/ACT inhaler INHALE 2 PUFFS INTO THE LUNGS EVERY 6 HOURS AS NEEDED FOR WHEEZING OR SHORTNESS OF BREATH  ? amLODipine (NORVASC) 5 MG tablet TAKE 1 TABLET(5 MG) BY MOUTH DAILY  ? aspirin EC 81 MG tablet Take 81 mg by mouth daily.  ? atorvastatin (LIPITOR) 10 MG tablet Take 1 tablet by mouth every other day.  ? budesonide-formoterol (SYMBICORT) 160-4.5 MCG/ACT inhaler Inhale 2 puffs into the lungs 2 (two) times daily.  ? calcium carbonate (OSCAL) 1500 (600 Ca) MG TABS tablet Take 600 mg of elemental calcium by mouth daily.  ? carvedilol (COREG) 25 MG tablet Take 0.5 tablets (12.5 mg total) by mouth 2 (two) times daily with a meal.  ? Cholecalciferol (VITAMIN D) 50 MCG (2000 UT) tablet Take 2,000 Units by mouth daily.  ? furosemide (LASIX) 20 MG tablet Take 1 tablet (20 mg total) by mouth daily.  ? latanoprost (XALATAN) 0.005 % ophthalmic solution Place 1 drop into both eyes at bedtime.  ? levothyroxine (SYNTHROID) 50 MCG tablet Take 1 tablet (50 mcg total) by  mouth daily before breakfast.  ? losartan (COZAAR) 100 MG tablet Take 1 tablet (100 mg total) by mouth daily.  ? nystatin-triamcinolone ointment (MYCOLOG) Apply 1 application. topically 2 (two) times daily.  ? vitamin B-12 (CYANOCOBALAMIN) 1000 MCG tablet Take 1,000 mcg by mouth daily.  ?  ? ?Allergies:   Theodrenaline  ? ?Social History  ? ?Socioeconomic History  ? Marital status: Widowed  ?  Spouse name: Not on file  ? Number of children: Not on file  ? Years of education: Not on file  ? Highest education level: Not on file  ?Occupational History  ? Not on file  ?Tobacco Use  ? Smoking status: Never  ? Smokeless  tobacco: Never  ? Tobacco comments:  ?  second hand smoke  ?Vaping Use  ? Vaping Use: Never used  ?Substance and Sexual Activity  ? Alcohol use: No  ? Drug use: No  ? Sexual activity: Not on file  ?Other Topics Concern  ? Not on file  ?Social History Narrative  ? Not on file  ? ?Social Determinants of Health  ? ?Financial Resource Strain: Low Risk   ? Difficulty of Paying Living Expenses: Not very hard  ?Food Insecurity: Not on file  ?Transportation Needs: Not on file  ?Physical Activity: Not on file  ?Stress: Not on file  ?Social Connections: Not on file  ?  ? ?Family History: ?The patient's family history includes Cancer in her brother, sister, and sister; Early death in her sister and sister; Heart attack in her father; Lung cancer in her brother; Stomach cancer in her brother. There is no history of Breast cancer. ? ?ROS:   ?Please see the history of present illness.    ? All other systems reviewed and are negative. ? ?EKGs/Labs/Other Studies Reviewed:   ? ?The following studies were reviewed today: ? ? ?EKG:  EKG not ordered today.  ? ?Recent Labs: ?08/16/2021: BUN 22; Creatinine, Ser 1.36; Potassium 3.8; Sodium 138  ?Recent Lipid Panel ?   ?Component Value Date/Time  ? CHOL 107 07/16/2020 1148  ? CHOL 212 (H) 12/31/2019 0853  ? TRIG 89 07/16/2020 1148  ? HDL 56 07/16/2020 1148  ? HDL 60 12/31/2019 0853  ? CHOLHDL 1.9 07/16/2020 1148  ? VLDL 18 07/16/2020 1148  ? [place] 33 07/16/2020 1148  ? [place] 130 (H) 12/31/2019 0853  ? ? ? ?Risk Assessment/Calculations:   ? ? ? ?Physical Exam:   ? ?VS:  BP 112/60 (BP Location: Left Arm, Patient Position: Sitting, Cuff Size: Normal)   Pulse 70   Ht '4\' 8"'$  (1.422 m)   Wt 129 lb (58.5 kg)   SpO2 97%   BMI 28.92 kg/m?    ? ?Wt Readings from Last 3 Encounters:  ?09/19/21 129 lb (58.5 kg)  ?09/09/21 130 lb (59 kg)  ?08/09/21 130 lb (59 kg)  ?  ? ?GEN:  Well nourished, well developed in no acute distress ?HEENT: Normal ?NECK: No JVD; No carotid bruits ?LYMPHATICS: No  lymphadenopathy ?CARDIAC: RRR, 2/6 systolic murmur ?RESPIRATORY:  Clear to auscultation without rales, wheezing or rhonchi  ?ABDOMEN: Soft, non-tender, non-distended ?MUSCULOSKELETAL: Trace edema; No deformity  ?SKIN: Warm and dry ?NEUROLOGIC:  Alert and oriented x 3 ?PSYCHIATRIC:  Normal affect  ? ?ASSESSMENT:   ? ?1. Primary hypertension   ?2. Aortic valve stenosis, etiology of cardiac valve disease unspecified   ?3. Diastolic dysfunction   ? ? ?PLAN:   ? ?In order of problems listed above: ? ?hypertension, BP improved,  now normal.  Continue Coreg 12.5 mg twice daily, losartan, Norvasc.   ?Mild to moderate aortic valve stenosis,.  Plan to Monitor with serial echocardiograms.  Repeat echo in 06/2022. ?grade 2 diastolic dysfunction, cough.  COPD contributing to shortness of breath.  Continue Lasix 20 mg daily . ? ?Follow-up after repeat echo. ? ? ?Shared Decision Making/Informed Consent   ?  ? ?Medication Adjustments/Labs and Tests Ordered: ?Current medicines are reviewed at length with the patient today.  Concerns regarding medicines are outlined above.  ?Orders Placed This Encounter  ?Procedures  ? ECHOCARDIOGRAM COMPLETE  ? ? ?No orders of the defined types were placed in this encounter. ? ? ? ?Patient Instructions  ?Medication Instructions:  ? ?Your physician recommends that you continue on your current medications as directed. Please refer to the Current Medication list given to you today. ? ?*If you need a refill on your cardiac medications before your next appointment, please call your pharmacy* ? ? ?Lab Work: ?None ordered ?If you have labs (blood work) drawn today and your tests are completely normal, you will receive your results only by: ?MyChart Message (if you have MyChart) OR ?A paper copy in the mail ?If you have any lab test that is abnormal or we need to change your treatment, we will call you to review the results. ? ? ?Testing/Procedures: ? ?Your physician has requested that you have an  echocardiogram in December 2023. Echocardiography is a painless test that uses sound waves to create images of your heart. It provides your doctor with information about the size and shape of your heart and how well your heart?s chamber

## 2021-09-23 DIAGNOSIS — J449 Chronic obstructive pulmonary disease, unspecified: Secondary | ICD-10-CM | POA: Diagnosis not present

## 2021-10-10 DIAGNOSIS — J449 Chronic obstructive pulmonary disease, unspecified: Secondary | ICD-10-CM | POA: Diagnosis not present

## 2021-10-20 ENCOUNTER — Ambulatory Visit: Payer: PPO | Admitting: Internal Medicine

## 2021-10-24 DIAGNOSIS — J449 Chronic obstructive pulmonary disease, unspecified: Secondary | ICD-10-CM | POA: Diagnosis not present

## 2021-11-09 ENCOUNTER — Telehealth: Payer: PPO

## 2021-11-09 DIAGNOSIS — J449 Chronic obstructive pulmonary disease, unspecified: Secondary | ICD-10-CM | POA: Diagnosis not present

## 2021-11-10 DIAGNOSIS — G4733 Obstructive sleep apnea (adult) (pediatric): Secondary | ICD-10-CM | POA: Diagnosis not present

## 2021-11-23 DIAGNOSIS — J449 Chronic obstructive pulmonary disease, unspecified: Secondary | ICD-10-CM | POA: Diagnosis not present

## 2021-11-30 ENCOUNTER — Other Ambulatory Visit: Payer: Self-pay | Admitting: Emergency Medicine

## 2021-11-30 MED ORDER — FUROSEMIDE 20 MG PO TABS: 20.0000 mg | ORAL_TABLET | Freq: Every day | ORAL | 3 refills | Status: DC

## 2021-12-10 DIAGNOSIS — J449 Chronic obstructive pulmonary disease, unspecified: Secondary | ICD-10-CM | POA: Diagnosis not present

## 2021-12-21 ENCOUNTER — Other Ambulatory Visit: Payer: Self-pay | Admitting: Physician Assistant

## 2021-12-21 DIAGNOSIS — I10 Essential (primary) hypertension: Secondary | ICD-10-CM

## 2021-12-24 DIAGNOSIS — J449 Chronic obstructive pulmonary disease, unspecified: Secondary | ICD-10-CM | POA: Diagnosis not present

## 2022-01-05 ENCOUNTER — Ambulatory Visit (INDEPENDENT_AMBULATORY_CARE_PROVIDER_SITE_OTHER): Payer: PPO | Admitting: Physician Assistant

## 2022-01-05 ENCOUNTER — Encounter: Payer: Self-pay | Admitting: Physician Assistant

## 2022-01-05 VITALS — BP 129/55 | HR 64 | Temp 98.0°F | Resp 16 | Ht <= 58 in | Wt 131.2 lb

## 2022-01-05 DIAGNOSIS — R300 Dysuria: Secondary | ICD-10-CM | POA: Diagnosis not present

## 2022-01-05 DIAGNOSIS — G4733 Obstructive sleep apnea (adult) (pediatric): Secondary | ICD-10-CM | POA: Diagnosis not present

## 2022-01-05 DIAGNOSIS — Z0001 Encounter for general adult medical examination with abnormal findings: Secondary | ICD-10-CM

## 2022-01-05 DIAGNOSIS — J449 Chronic obstructive pulmonary disease, unspecified: Secondary | ICD-10-CM | POA: Diagnosis not present

## 2022-01-05 DIAGNOSIS — N1832 Chronic kidney disease, stage 3b: Secondary | ICD-10-CM | POA: Diagnosis not present

## 2022-01-05 DIAGNOSIS — I10 Essential (primary) hypertension: Secondary | ICD-10-CM

## 2022-01-05 MED ORDER — LOSARTAN POTASSIUM 100 MG PO TABS: 100.0000 mg | ORAL_TABLET | Freq: Every day | ORAL | 3 refills | Status: DC

## 2022-01-05 NOTE — Progress Notes (Signed)
[facility] [place], [place] [address]  Internal MEDICINE  Office Visit Note  Patient Name: Brenda Tucker  [id number]  [id number]  Date of Service: 01/05/2022  Chief Complaint  Patient presents with   Medicare Wellness   Hypertension   Medication Refill    Losartan and Fluid Pill     HPI Pt is here for routine health maintenance examination -She is followed by cardiology for HTN, AS, and diastolic dysfunction -She is also followed by nephrology--goes to see them next month and has labs monitored by their office.  -She does get some cramping in toes -She is on continunous oxygen managed by pulmonology and reports she is doing well on 2L and with her symbicort inhaler -Cardiology changed her to 1/2 tab carvedilol BID. States she is on her last lasix refill however on review it appears this was from cardiology and there are refills on it. She is almost out of losartan--also done by cardiology, but will go ahead and refill for her today -Sleep is about the same, wears cpap with oxygen -she does have some arthralgias in hands and hip and will take tylenol as needed. Doesn't like that she is more limited in what activities she can do now, but was encouraged to stay as active as she can safely be  Current Medication: Outpatient Encounter Medications as of 01/05/2022  Medication Sig   acetaminophen (TYLENOL) 500 MG tablet Take 500 mg by mouth every 6 (six) hours as needed for moderate pain or headache.   albuterol (VENTOLIN HFA) 108 (90 Base) MCG/ACT inhaler INHALE 2 PUFFS INTO THE LUNGS EVERY 6 HOURS AS NEEDED FOR WHEEZING OR SHORTNESS OF BREATH   amLODipine (NORVASC) 5 MG tablet TAKE 1 TABLET(5 MG) BY MOUTH DAILY   aspirin EC 81 MG tablet Take 81 mg by mouth daily.   atorvastatin (LIPITOR) 10 MG tablet Take 1 tablet by mouth every other day.   budesonide-formoterol (SYMBICORT) 160-4.5 MCG/ACT inhaler Inhale 2 puffs into the lungs 2 (two) times daily.   calcium  carbonate (OSCAL) 1500 (600 Ca) MG TABS tablet Take 600 mg of elemental calcium by mouth daily.   carvedilol (COREG) 25 MG tablet Take 0.5 tablets (12.5 mg total) by mouth 2 (two) times daily with a meal.   Cholecalciferol (VITAMIN D) 50 MCG (2000 UT) tablet Take 2,000 Units by mouth daily.   furosemide (LASIX) 20 MG tablet Take 1 tablet (20 mg total) by mouth daily.   latanoprost (XALATAN) 0.005 % ophthalmic solution Place 1 drop into both eyes at bedtime.   levothyroxine (SYNTHROID) 50 MCG tablet Take 1 tablet (50 mcg total) by mouth daily before breakfast.   nystatin-triamcinolone ointment (MYCOLOG) Apply 1 application. topically 2 (two) times daily.   vitamin B-12 (CYANOCOBALAMIN) 1000 MCG tablet Take 1,000 mcg by mouth daily.   [DISCONTINUED] losartan (COZAAR) 100 MG tablet Take 1 tablet (100 mg total) by mouth daily.   losartan (COZAAR) 100 MG tablet Take 1 tablet (100 mg total) by mouth daily.   [DISCONTINUED] fluticasone-salmeterol (ADVAIR DISKUS) 250-50 MCG/ACT AEPB INHALE 1 PUFF INTO THE LUNGS TWICE DAILY   No facility-administered encounter medications on file as of 01/05/2022.    Surgical History: Past Surgical History:  Procedure Laterality Date   ABDOMINAL HYSTERECTOMY     APPENDECTOMY     CHOLECYSTECTOMY N/A 03/12/2020   Procedure: LAPAROSCOPIC CHOLECYSTECTOMY;  Surgeon: Fredirick Maudlin, MD;  Location: [facility] ORS;  Service: General;  Laterality: N/A;   COLONOSCOPY WITH PROPOFOL N/A 07/09/2015  Procedure: COLONOSCOPY WITH PROPOFOL;  Surgeon: Manya Silvas, MD;  Location: [facility] ENDOSCOPY;  Service: Endoscopy;  Laterality: N/A;    Medical History: Past Medical History:  Diagnosis Date   Arthritis    Asthma    Cancer ([place])    skin, colon polyp   Chronic kidney disease    Complication of anesthesia    asthma attack per pt   COPD (chronic obstructive pulmonary disease) ([place])    Dyspnea    Hypertension    Hypothyroidism     Family History: Family History  Problem  Relation Age of Onset   Heart attack Father    Early death Sister        MVA   Stomach cancer Brother    Cancer Brother        shoulder   Lung cancer Brother    Cancer Sister    Cancer Sister    Early death Sister        husband killed her   Breast cancer Neg Hx       Review of Systems  Constitutional:  Negative for chills, fatigue and unexpected weight change.  HENT:  Negative for congestion, postnasal drip, rhinorrhea, sneezing and sore throat.   Eyes:  Negative for redness.  Respiratory:  Positive for shortness of breath. Negative for cough, chest tightness and wheezing.   Cardiovascular:  Negative for chest pain and palpitations.  Gastrointestinal:  Negative for abdominal pain, constipation, diarrhea, nausea and vomiting.  Genitourinary:  Negative for dysuria and frequency.  Musculoskeletal:  Positive for arthralgias. Negative for back pain, joint swelling and neck pain.  Skin:  Negative for rash.  Neurological: Negative.  Negative for tremors and numbness.  Hematological:  Negative for adenopathy. Does not bruise/bleed easily.  Psychiatric/Behavioral:  Negative for behavioral problems (Depression), sleep disturbance and suicidal ideas. The patient is not nervous/anxious.      Vital Signs: BP (!) 129/55   Pulse 64   Temp 98 F (36.7 C)   Resp 16   Ht '4\' 8"'$  (1.422 m)   Wt 131 lb 3.2 oz (59.5 kg)   SpO2 98%   BMI 29.41 kg/m    Physical Exam Vitals and nursing note reviewed.  Constitutional:      General: She is not in acute distress.    Appearance: She is well-developed. She is not diaphoretic.  HENT:     Head: Normocephalic and atraumatic.     Mouth/Throat:     Pharynx: No oropharyngeal exudate.  Eyes:     Pupils: Pupils are equal, round, and reactive to light.  Neck:     Thyroid: No thyromegaly.     Vascular: No JVD.     Trachea: No tracheal deviation.  Cardiovascular:     Rate and Rhythm: Normal rate and regular rhythm.     Heart sounds: Normal  heart sounds. No murmur heard.    No friction rub. No gallop.  Pulmonary:     Effort: Pulmonary effort is normal. No respiratory distress.     Breath sounds: No wheezing or rales.  Chest:     Chest wall: No tenderness.  Breasts:    Right: Normal. No mass.     Left: No mass.  Abdominal:     General: Bowel sounds are normal.     Palpations: Abdomen is soft.     Tenderness: There is no abdominal tenderness.  Musculoskeletal:        General: Normal range of motion.     Cervical back:  Normal range of motion and neck supple.     Right lower leg: No edema.     Left lower leg: No edema.  Lymphadenopathy:     Cervical: No cervical adenopathy.  Skin:    General: Skin is warm and dry.  Neurological:     Mental Status: She is alert and oriented to person, place, and time.     Cranial Nerves: No cranial nerve deficit.  Psychiatric:        Behavior: Behavior normal.        Thought Content: Thought content normal.        Judgment: Judgment normal.      LABS: No results found for this or any previous visit (from the past 2160 hour(s)).      Assessment/Plan: 1. Encounter for general adult medical examination with abnormal findings CPE performed, declines shingles vaccine at this time  2. Essential hypertension Continue current medications and follow up with cardiology as scheduled - losartan (COZAAR) 100 MG tablet; Take 1 tablet (100 mg total) by mouth daily.  Dispense: 30 tablet; Refill: 3  3. OSA (obstructive sleep apnea) Continue cpap nightly  4. Chronic obstructive pulmonary disease, unspecified COPD type ([place]) Continue inhalers and oxygen as prescribed, followed by pulmonology  5. Stage 3b chronic kidney disease ([place]) Followed by nephrology  6. Dysuria - UA/M w/rflx Culture, Routine   General Counseling: Nikiyah verbalizes understanding of the findings of todays visit and agrees with plan of treatment. I have discussed any further diagnostic evaluation that Yang be  needed or ordered today. We also reviewed her medications today. she has been encouraged to call the office with any questions or concerns that should arise related to todays visit.    Counseling:    Orders Placed This Encounter  Procedures   UA/M w/rflx Culture, Routine    Meds ordered this encounter  Medications   losartan (COZAAR) 100 MG tablet    Sig: Take 1 tablet (100 mg total) by mouth daily.    Dispense:  30 tablet    Refill:  3    This patient was seen by Drema Dallas, PA-C in collaboration with Dr. Clayborn Bigness as a part of collaborative care agreement.  Total time spent:35 Minutes  Time spent includes review of chart, medications, test results, and follow up plan with the patient.     Lavera Guise, MD  Internal Medicine

## 2022-01-07 DIAGNOSIS — J449 Chronic obstructive pulmonary disease, unspecified: Secondary | ICD-10-CM | POA: Diagnosis not present

## 2022-01-07 DIAGNOSIS — G4733 Obstructive sleep apnea (adult) (pediatric): Secondary | ICD-10-CM | POA: Diagnosis not present

## 2022-01-09 DIAGNOSIS — J449 Chronic obstructive pulmonary disease, unspecified: Secondary | ICD-10-CM | POA: Diagnosis not present

## 2022-01-11 ENCOUNTER — Other Ambulatory Visit: Payer: Self-pay | Admitting: Physician Assistant

## 2022-01-11 ENCOUNTER — Telehealth: Payer: Self-pay

## 2022-01-11 DIAGNOSIS — H353132 Nonexudative age-related macular degeneration, bilateral, intermediate dry stage: Secondary | ICD-10-CM | POA: Diagnosis not present

## 2022-01-11 DIAGNOSIS — N3000 Acute cystitis without hematuria: Secondary | ICD-10-CM

## 2022-01-11 LAB — UA/M W/RFLX CULTURE, ROUTINE
Bilirubin, UA: NEGATIVE
Glucose, UA: NEGATIVE
Ketones, UA: NEGATIVE
Nitrite, UA: NEGATIVE
Protein,UA: NEGATIVE
RBC, UA: NEGATIVE
Specific Gravity, UA: 1.009 (ref 1.005–1.030)
Urobilinogen, Ur: 0.2 mg/dL (ref 0.2–1.0)
pH, UA: 5.5 (ref 5.0–7.5)

## 2022-01-11 LAB — MICROSCOPIC EXAMINATION
Bacteria, UA: NONE SEEN
Casts: NONE SEEN /lpf
RBC, Urine: NONE SEEN /hpf (ref 0–2)
WBC, UA: >30 /hpf — AB (ref 0–5)

## 2022-01-11 LAB — URINE CULTURE, REFLEX

## 2022-01-11 MED ORDER — CEPHALEXIN 250 MG PO CAPS: 250.0000 mg | ORAL_CAPSULE | Freq: Three times a day (TID) | ORAL | 0 refills | Status: DC

## 2022-01-11 NOTE — Telephone Encounter (Signed)
-----   Message from Mylinda Latina, PA-C sent at 01/11/2022 12:35 PM EDT ----- Please let her know she has a UTI and I have sent keflex to the pharmacy for her

## 2022-01-11 NOTE — Telephone Encounter (Signed)
Spoke to pt and informed her that she has a UTI and that Lauren sent abx to her pharmacy

## 2022-01-18 DIAGNOSIS — H47393 Other disorders of optic disc, bilateral: Secondary | ICD-10-CM | POA: Diagnosis not present

## 2022-01-18 DIAGNOSIS — H40003 Preglaucoma, unspecified, bilateral: Secondary | ICD-10-CM | POA: Diagnosis not present

## 2022-01-18 DIAGNOSIS — H353190 Nonexudative age-related macular degeneration, unspecified eye, stage unspecified: Secondary | ICD-10-CM | POA: Diagnosis not present

## 2022-01-23 DIAGNOSIS — J449 Chronic obstructive pulmonary disease, unspecified: Secondary | ICD-10-CM | POA: Diagnosis not present

## 2022-01-31 ENCOUNTER — Other Ambulatory Visit: Payer: Self-pay

## 2022-01-31 DIAGNOSIS — R0602 Shortness of breath: Secondary | ICD-10-CM

## 2022-02-03 ENCOUNTER — Telehealth: Payer: Self-pay

## 2022-02-03 NOTE — Telephone Encounter (Signed)
Left vm to confirm 02/08/22 appointment-Toni

## 2022-02-06 ENCOUNTER — Ambulatory Visit: Payer: PPO | Admitting: Nurse Practitioner

## 2022-02-07 DIAGNOSIS — G4733 Obstructive sleep apnea (adult) (pediatric): Secondary | ICD-10-CM | POA: Diagnosis not present

## 2022-02-07 DIAGNOSIS — J449 Chronic obstructive pulmonary disease, unspecified: Secondary | ICD-10-CM | POA: Diagnosis not present

## 2022-02-08 ENCOUNTER — Ambulatory Visit: Payer: PPO | Admitting: Internal Medicine

## 2022-02-08 DIAGNOSIS — R0602 Shortness of breath: Secondary | ICD-10-CM

## 2022-02-09 DIAGNOSIS — J449 Chronic obstructive pulmonary disease, unspecified: Secondary | ICD-10-CM | POA: Diagnosis not present

## 2022-02-14 DIAGNOSIS — I129 Hypertensive chronic kidney disease with stage 1 through stage 4 chronic kidney disease, or unspecified chronic kidney disease: Secondary | ICD-10-CM | POA: Diagnosis not present

## 2022-02-14 DIAGNOSIS — N2581 Secondary hyperparathyroidism of renal origin: Secondary | ICD-10-CM | POA: Diagnosis not present

## 2022-02-14 DIAGNOSIS — D631 Anemia in chronic kidney disease: Secondary | ICD-10-CM | POA: Diagnosis not present

## 2022-02-14 DIAGNOSIS — N1832 Chronic kidney disease, stage 3b: Secondary | ICD-10-CM | POA: Diagnosis not present

## 2022-02-16 ENCOUNTER — Encounter: Payer: Self-pay | Admitting: Nurse Practitioner

## 2022-02-16 ENCOUNTER — Ambulatory Visit (INDEPENDENT_AMBULATORY_CARE_PROVIDER_SITE_OTHER): Payer: PPO | Admitting: Nurse Practitioner

## 2022-02-16 VITALS — BP 138/70 | HR 72 | Temp 98.0°F | Resp 16 | Ht <= 58 in | Wt 130.0 lb

## 2022-02-16 DIAGNOSIS — G4733 Obstructive sleep apnea (adult) (pediatric): Secondary | ICD-10-CM | POA: Diagnosis not present

## 2022-02-16 DIAGNOSIS — Z7189 Other specified counseling: Secondary | ICD-10-CM | POA: Diagnosis not present

## 2022-02-16 DIAGNOSIS — J449 Chronic obstructive pulmonary disease, unspecified: Secondary | ICD-10-CM

## 2022-02-16 DIAGNOSIS — Z9989 Dependence on other enabling machines and devices: Secondary | ICD-10-CM | POA: Diagnosis not present

## 2022-02-16 DIAGNOSIS — J4489 Other specified chronic obstructive pulmonary disease: Secondary | ICD-10-CM

## 2022-02-16 MED ORDER — BUDESONIDE-FORMOTEROL FUMARATE 160-4.5 MCG/ACT IN AERO: 2.0000 | INHALATION_SPRAY | Freq: Two times a day (BID) | RESPIRATORY_TRACT | 5 refills | Status: DC

## 2022-02-16 NOTE — Progress Notes (Signed)
[facility] [place], [place] [address]  Internal MEDICINE  Office Visit Note  Patient Name: Brenda Tucker  [id number]  [id number]  Date of Service: 02/16/2022  Chief Complaint  Patient presents with   Follow-up    PFT    HPI Brenda Tucker presents for a follow up pulmonary visit to discuss PFT results.  --currently using 2 LPM supplemental oxygen, has been coughing a lot, no recent hospital admissions --consistently using her symbicort inhaler and her CPAP --reports compliance with CPAP, no download available. Does not need supplies at this time. No issues reports, states she uses the machine 4 or more hours per night.  --PFT results -- severe obstructive lung disease, no significant change from last PFT FVC mod decrease FEV1 severe decrease Ratio is decreased No significant change post bronchodilator Increased TLC and residual volume and ratio.      Current Medication: Outpatient Encounter Medications as of 02/16/2022  Medication Sig   acetaminophen (TYLENOL) 500 MG tablet Take 500 mg by mouth every 6 (six) hours as needed for moderate pain or headache.   albuterol (VENTOLIN HFA) 108 (90 Base) MCG/ACT inhaler INHALE 2 PUFFS INTO THE LUNGS EVERY 6 HOURS AS NEEDED FOR WHEEZING OR SHORTNESS OF BREATH   amLODipine (NORVASC) 5 MG tablet TAKE 1 TABLET(5 MG) BY MOUTH DAILY   aspirin EC 81 MG tablet Take 81 mg by mouth daily.   atorvastatin (LIPITOR) 10 MG tablet Take 1 tablet by mouth every other day.   calcium carbonate (OSCAL) 1500 (600 Ca) MG TABS tablet Take 600 mg of elemental calcium by mouth daily.   carvedilol (COREG) 25 MG tablet Take 0.5 tablets (12.5 mg total) by mouth 2 (two) times daily with a meal.   cephALEXin (KEFLEX) 250 MG capsule Take 1 capsule (250 mg total) by mouth 3 (three) times daily.   Cholecalciferol (VITAMIN D) 50 MCG (2000 UT) tablet Take 2,000 Units by mouth daily.   furosemide (LASIX) 20 MG tablet Take 1 tablet (20 mg total) by mouth  daily.   latanoprost (XALATAN) 0.005 % ophthalmic solution Place 1 drop into both eyes at bedtime.   levothyroxine (SYNTHROID) 50 MCG tablet Take 1 tablet (50 mcg total) by mouth daily before breakfast.   losartan (COZAAR) 100 MG tablet Take 1 tablet (100 mg total) by mouth daily.   nystatin-triamcinolone ointment (MYCOLOG) Apply 1 application. topically 2 (two) times daily.   OXYGEN Inhale into the lungs. 2 litre AHP   vitamin B-12 (CYANOCOBALAMIN) 1000 MCG tablet Take 1,000 mcg by mouth daily.   [DISCONTINUED] budesonide-formoterol (SYMBICORT) 160-4.5 MCG/ACT inhaler Inhale 2 puffs into the lungs 2 (two) times daily.   budesonide-formoterol (SYMBICORT) 160-4.5 MCG/ACT inhaler Inhale 2 puffs into the lungs 2 (two) times daily.   [DISCONTINUED] fluticasone-salmeterol (ADVAIR DISKUS) 250-50 MCG/ACT AEPB INHALE 1 PUFF INTO THE LUNGS TWICE DAILY   No facility-administered encounter medications on file as of 02/16/2022.    Surgical History: Past Surgical History:  Procedure Laterality Date   ABDOMINAL HYSTERECTOMY     APPENDECTOMY     CHOLECYSTECTOMY N/A 03/12/2020   Procedure: LAPAROSCOPIC CHOLECYSTECTOMY;  Surgeon: Fredirick Maudlin, MD;  Location: [facility] ORS;  Service: General;  Laterality: N/A;   COLONOSCOPY WITH PROPOFOL N/A 07/09/2015   Procedure: COLONOSCOPY WITH PROPOFOL;  Surgeon: Manya Silvas, MD;  Location: [facility] ENDOSCOPY;  Service: Endoscopy;  Laterality: N/A;    Medical History: Past Medical History:  Diagnosis Date   Arthritis    Asthma    Cancer (  [place])    skin, colon polyp   Chronic kidney disease    Complication of anesthesia    asthma attack per pt   COPD (chronic obstructive pulmonary disease) ([facility])    Dyspnea    Hypertension    Hypothyroidism     Family History: Family History  Problem Relation Age of Onset   Heart attack Father    Early death Sister        MVA   Stomach cancer Brother    Cancer Brother        shoulder   Lung cancer Brother    Cancer  Sister    Cancer Sister    Early death Sister        husband killed her   Breast cancer Neg Hx     Social History   Socioeconomic History   Marital status: Widowed    Spouse name: Not on file   Number of children: Not on file   Years of education: Not on file   Highest education level: Not on file  Occupational History   Not on file  Tobacco Use   Smoking status: Never   Smokeless tobacco: Never   Tobacco comments:    second hand smoke  Vaping Use   Vaping Use: Never used  Substance and Sexual Activity   Alcohol use: No   Drug use: No   Sexual activity: Not on file  Other Topics Concern   Not on file  Social History Narrative   Not on file   Social Determinants of Health   Financial Resource Strain: Low Risk  (12/15/2020)   Overall Financial Resource Strain (CARDIA)    Difficulty of Paying Living Expenses: Not very hard  Food Insecurity: Not on file  Transportation Needs: Not on file  Physical Activity: Not on file  Stress: Not on file  Social Connections: Not on file  Intimate Partner Violence: Not on file      Review of Systems  Constitutional:  Negative for chills, fatigue and unexpected weight change.  HENT:  Negative for congestion, postnasal drip, rhinorrhea, sneezing and sore throat.   Respiratory:  Positive for shortness of breath. Negative for cough, chest tightness and wheezing.   Cardiovascular:  Negative for chest pain and palpitations.  Gastrointestinal:  Negative for abdominal pain, constipation, diarrhea, nausea and vomiting.  Genitourinary:  Negative for dysuria and frequency.  Skin:  Negative for rash.  Neurological: Negative.  Negative for tremors and numbness.  Psychiatric/Behavioral:  Behavioral problem: Depression.     Vital Signs: BP 138/70   Pulse 72   Temp 98 F (36.7 C)   Resp 16   Ht '4\' 8"'$  (1.422 m)   Wt 130 lb (59 kg)   SpO2 96% Comment: 2 litre  BMI 29.15 kg/m    Physical Exam Vitals reviewed.  Constitutional:       General: She is not in acute distress.    Appearance: Normal appearance. She is well-developed and normal weight. She is not diaphoretic.  HENT:     Head: Normocephalic and atraumatic.  Eyes:     Pupils: Pupils are equal, round, and reactive to light.  Neck:     Thyroid: No thyromegaly.     Vascular: No JVD.     Trachea: No tracheal deviation.  Cardiovascular:     Rate and Rhythm: Normal rate and regular rhythm.     Heart sounds: Normal heart sounds. No murmur heard.    No friction rub. No gallop.  Pulmonary:  Effort: Pulmonary effort is normal. No respiratory distress.     Breath sounds: Wheezing present. No rales.  Chest:     Chest wall: No tenderness.  Lymphadenopathy:     Cervical: No cervical adenopathy.  Neurological:     Mental Status: She is alert and oriented to person, place, and time.  Psychiatric:        Mood and Affect: Mood normal.        Behavior: Behavior normal.        Assessment/Plan: 1. Obstructive chronic bronchitis without exacerbation ([place]) Continue symbicort as prescribed. No significant changes in respiratory status. Follow up in 6 months  2. OSA on CPAP Continue using CPAP as instructed and supplemental oxygen. Follow up in 6 months  3. CPAP use counseling Discussed CPAP use, no issues, no concerns, does not need supplies    General Counseling: Brenda Tucker verbalizes understanding of the findings of todays visit and agrees with plan of treatment. I have discussed any further diagnostic evaluation that [place] be needed or ordered today. We also reviewed her medications today. she has been encouraged to call the office with any questions or concerns that should arise related to todays visit.    No orders of the defined types were placed in this encounter.   Meds ordered this encounter  Medications   budesonide-formoterol (SYMBICORT) 160-4.5 MCG/ACT inhaler    Sig: Inhale 2 puffs into the lungs 2 (two) times daily.    Dispense:  1 each    Refill:   5    Return in about 6 months (around 08/19/2022) for F/U, pulmonary/sleep with DSK or Natarsha Hurwitz. .   Total time spent:30 Minutes Time spent includes review of chart, medications, test results, and follow up plan with the patient.   [place] Controlled Substance Database was reviewed by me.  This patient was seen by Jonetta Osgood, FNP-C in collaboration with Dr. Clayborn Bigness as a part of collaborative care agreement.   Saleen Peden R. Valetta Fuller, MSN, FNP-C Internal medicine

## 2022-02-17 ENCOUNTER — Telehealth: Payer: Self-pay

## 2022-02-17 NOTE — Telephone Encounter (Signed)
Per [facility] request, last office not faxed to them [phone]

## 2022-02-19 NOTE — Procedures (Signed)
[facility] [address] [place], [address]    Complete Pulmonary Function Testing Interpretation:  FINDINGS:  Forced vital capacity is moderately decreased.  FEV1 is severely decreased.  FEV1 FVC ratio is decreased.  Postbronchodilator no significant change was noted.  Total lung capacity is increased residual volume is increased residual volume total lung capacity ratio is increased FRC is normal.  Patient was not able to perform DLCO  IMPRESSION:  This pulmonary function study is suggestive of severe obstructive lung disease  Brenda Gee, MD [facility] Pulmonary Critical Care Medicine Sleep Medicine

## 2022-02-20 DIAGNOSIS — I129 Hypertensive chronic kidney disease with stage 1 through stage 4 chronic kidney disease, or unspecified chronic kidney disease: Secondary | ICD-10-CM | POA: Diagnosis not present

## 2022-02-20 DIAGNOSIS — D631 Anemia in chronic kidney disease: Secondary | ICD-10-CM | POA: Diagnosis not present

## 2022-02-20 DIAGNOSIS — I10 Essential (primary) hypertension: Secondary | ICD-10-CM | POA: Diagnosis not present

## 2022-02-20 DIAGNOSIS — N184 Chronic kidney disease, stage 4 (severe): Secondary | ICD-10-CM | POA: Diagnosis not present

## 2022-02-20 LAB — PULMONARY FUNCTION TEST

## 2022-02-22 ENCOUNTER — Other Ambulatory Visit: Payer: Self-pay | Admitting: Nephrology

## 2022-02-22 DIAGNOSIS — I129 Hypertensive chronic kidney disease with stage 1 through stage 4 chronic kidney disease, or unspecified chronic kidney disease: Secondary | ICD-10-CM

## 2022-02-22 DIAGNOSIS — N184 Chronic kidney disease, stage 4 (severe): Secondary | ICD-10-CM

## 2022-02-22 DIAGNOSIS — D631 Anemia in chronic kidney disease: Secondary | ICD-10-CM

## 2022-02-23 DIAGNOSIS — J449 Chronic obstructive pulmonary disease, unspecified: Secondary | ICD-10-CM | POA: Diagnosis not present

## 2022-02-27 DIAGNOSIS — H353190 Nonexudative age-related macular degeneration, unspecified eye, stage unspecified: Secondary | ICD-10-CM | POA: Diagnosis not present

## 2022-03-01 ENCOUNTER — Ambulatory Visit
Admission: RE | Admit: 2022-03-01 | Discharge: 2022-03-01 | Disposition: A | Discharge status: Home / Self Care | Payer: PPO | Source: Ambulatory Visit | Attending: Nephrology | Admitting: Nephrology

## 2022-03-01 DIAGNOSIS — I129 Hypertensive chronic kidney disease with stage 1 through stage 4 chronic kidney disease, or unspecified chronic kidney disease: Secondary | ICD-10-CM | POA: Insufficient documentation

## 2022-03-01 DIAGNOSIS — N189 Chronic kidney disease, unspecified: Secondary | ICD-10-CM | POA: Diagnosis not present

## 2022-03-01 DIAGNOSIS — N184 Chronic kidney disease, stage 4 (severe): Secondary | ICD-10-CM | POA: Insufficient documentation

## 2022-03-01 DIAGNOSIS — N281 Cyst of kidney, acquired: Secondary | ICD-10-CM | POA: Diagnosis not present

## 2022-03-01 DIAGNOSIS — D631 Anemia in chronic kidney disease: Secondary | ICD-10-CM | POA: Insufficient documentation

## 2022-03-08 DIAGNOSIS — N2581 Secondary hyperparathyroidism of renal origin: Secondary | ICD-10-CM | POA: Diagnosis not present

## 2022-03-08 DIAGNOSIS — J9610 Chronic respiratory failure, unspecified whether with hypoxia or hypercapnia: Secondary | ICD-10-CM | POA: Diagnosis not present

## 2022-03-08 DIAGNOSIS — E559 Vitamin D deficiency, unspecified: Secondary | ICD-10-CM | POA: Diagnosis not present

## 2022-03-08 DIAGNOSIS — E538 Deficiency of other specified B group vitamins: Secondary | ICD-10-CM | POA: Diagnosis not present

## 2022-03-08 DIAGNOSIS — E039 Hypothyroidism, unspecified: Secondary | ICD-10-CM | POA: Diagnosis not present

## 2022-03-08 DIAGNOSIS — E663 Overweight: Secondary | ICD-10-CM | POA: Diagnosis not present

## 2022-03-08 DIAGNOSIS — N183 Chronic kidney disease, stage 3 unspecified: Secondary | ICD-10-CM | POA: Diagnosis not present

## 2022-03-08 DIAGNOSIS — J449 Chronic obstructive pulmonary disease, unspecified: Secondary | ICD-10-CM | POA: Diagnosis not present

## 2022-03-08 DIAGNOSIS — I509 Heart failure, unspecified: Secondary | ICD-10-CM | POA: Diagnosis not present

## 2022-03-08 DIAGNOSIS — E785 Hyperlipidemia, unspecified: Secondary | ICD-10-CM | POA: Diagnosis not present

## 2022-03-08 DIAGNOSIS — I130 Hypertensive heart and chronic kidney disease with heart failure and stage 1 through stage 4 chronic kidney disease, or unspecified chronic kidney disease: Secondary | ICD-10-CM | POA: Diagnosis not present

## 2022-03-08 DIAGNOSIS — E261 Secondary hyperaldosteronism: Secondary | ICD-10-CM | POA: Diagnosis not present

## 2022-03-09 ENCOUNTER — Other Ambulatory Visit: Payer: Self-pay | Admitting: Physician Assistant

## 2022-03-09 DIAGNOSIS — E782 Mixed hyperlipidemia: Secondary | ICD-10-CM

## 2022-03-12 DIAGNOSIS — J449 Chronic obstructive pulmonary disease, unspecified: Secondary | ICD-10-CM | POA: Diagnosis not present

## 2022-03-23 DIAGNOSIS — G4733 Obstructive sleep apnea (adult) (pediatric): Secondary | ICD-10-CM | POA: Diagnosis not present

## 2022-03-26 DIAGNOSIS — J449 Chronic obstructive pulmonary disease, unspecified: Secondary | ICD-10-CM | POA: Diagnosis not present

## 2022-04-05 ENCOUNTER — Other Ambulatory Visit: Payer: Self-pay

## 2022-04-05 MED ORDER — FUROSEMIDE 20 MG PO TABS: 20.0000 mg | ORAL_TABLET | Freq: Every day | ORAL | 3 refills | Status: DC

## 2022-04-05 NOTE — Telephone Encounter (Signed)
Refill sent to pharmacy.   

## 2022-04-11 DIAGNOSIS — J449 Chronic obstructive pulmonary disease, unspecified: Secondary | ICD-10-CM | POA: Diagnosis not present

## 2022-04-14 LAB — PULMONARY FUNCTION TEST

## 2022-04-16 ENCOUNTER — Other Ambulatory Visit: Payer: Self-pay | Admitting: Internal Medicine

## 2022-04-16 DIAGNOSIS — J4489 Other specified chronic obstructive pulmonary disease: Secondary | ICD-10-CM

## 2022-04-21 ENCOUNTER — Encounter: Payer: Self-pay | Admitting: Nurse Practitioner

## 2022-04-24 DIAGNOSIS — I129 Hypertensive chronic kidney disease with stage 1 through stage 4 chronic kidney disease, or unspecified chronic kidney disease: Secondary | ICD-10-CM | POA: Diagnosis not present

## 2022-04-24 DIAGNOSIS — N184 Chronic kidney disease, stage 4 (severe): Secondary | ICD-10-CM | POA: Diagnosis not present

## 2022-04-24 DIAGNOSIS — R809 Proteinuria, unspecified: Secondary | ICD-10-CM | POA: Diagnosis not present

## 2022-04-24 DIAGNOSIS — R319 Hematuria, unspecified: Secondary | ICD-10-CM | POA: Diagnosis not present

## 2022-04-24 DIAGNOSIS — N1832 Chronic kidney disease, stage 3b: Secondary | ICD-10-CM | POA: Diagnosis not present

## 2022-04-24 DIAGNOSIS — N2581 Secondary hyperparathyroidism of renal origin: Secondary | ICD-10-CM | POA: Diagnosis not present

## 2022-04-24 DIAGNOSIS — I10 Essential (primary) hypertension: Secondary | ICD-10-CM | POA: Diagnosis not present

## 2022-04-24 DIAGNOSIS — D631 Anemia in chronic kidney disease: Secondary | ICD-10-CM | POA: Diagnosis not present

## 2022-04-25 DIAGNOSIS — J449 Chronic obstructive pulmonary disease, unspecified: Secondary | ICD-10-CM | POA: Diagnosis not present

## 2022-05-01 DIAGNOSIS — N184 Chronic kidney disease, stage 4 (severe): Secondary | ICD-10-CM | POA: Diagnosis not present

## 2022-05-01 DIAGNOSIS — I129 Hypertensive chronic kidney disease with stage 1 through stage 4 chronic kidney disease, or unspecified chronic kidney disease: Secondary | ICD-10-CM | POA: Diagnosis not present

## 2022-05-01 DIAGNOSIS — I10 Essential (primary) hypertension: Secondary | ICD-10-CM | POA: Diagnosis not present

## 2022-05-01 DIAGNOSIS — D631 Anemia in chronic kidney disease: Secondary | ICD-10-CM | POA: Diagnosis not present

## 2022-05-08 ENCOUNTER — Ambulatory Visit (INDEPENDENT_AMBULATORY_CARE_PROVIDER_SITE_OTHER): Payer: PPO | Admitting: Physician Assistant

## 2022-05-08 ENCOUNTER — Encounter: Payer: Self-pay | Admitting: Physician Assistant

## 2022-05-08 VITALS — BP 124/88 | HR 69 | Temp 98.4°F | Resp 16 | Ht <= 58 in | Wt 125.6 lb

## 2022-05-08 DIAGNOSIS — I129 Hypertensive chronic kidney disease with stage 1 through stage 4 chronic kidney disease, or unspecified chronic kidney disease: Secondary | ICD-10-CM

## 2022-05-08 DIAGNOSIS — I10 Essential (primary) hypertension: Secondary | ICD-10-CM

## 2022-05-08 NOTE — Progress Notes (Signed)
[facility] [place], [place] [address]  Internal MEDICINE  Office Visit Note  Patient Name: Brenda Tucker  [id number]  [id number]  Date of Service: 05/08/2022  Chief Complaint  Patient presents with   Follow-up   Hypertension    HPI Pt is here for routine follow up and is doing well today -Nephrology cut her amlodipine down in half--taking 2.5 mg and BP stable -Continues to take 1/2 tab carvedilol BID -Goes back to cardiology in Dec and will have an updated Echo then -2L of oxygen continuously and wearing cpap at night  Current Medication: Outpatient Encounter Medications as of 05/08/2022  Medication Sig   acetaminophen (TYLENOL) 500 MG tablet Take 500 mg by mouth every 6 (six) hours as needed for moderate pain or headache.   albuterol (VENTOLIN HFA) 108 (90 Base) MCG/ACT inhaler INHALE 2 PUFFS INTO THE LUNGS EVERY 6 HOURS AS NEEDED FOR WHEEZING OR SHORTNESS OF BREATH   amLODipine (NORVASC) 2.5 MG tablet Take 2.5 mg by mouth daily.   aspirin EC 81 MG tablet Take 81 mg by mouth daily.   atorvastatin (LIPITOR) 10 MG tablet TAKE 1 TABLET BY MOUTH EVERY OTHER DAY   calcium carbonate (OSCAL) 1500 (600 Ca) MG TABS tablet Take 600 mg of elemental calcium by mouth daily.   carvedilol (COREG) 25 MG tablet Take 0.5 tablets (12.5 mg total) by mouth 2 (two) times daily with a meal.   cephALEXin (KEFLEX) 250 MG capsule Take 1 capsule (250 mg total) by mouth 3 (three) times daily.   Cholecalciferol (VITAMIN D) 50 MCG (2000 UT) tablet Take 2,000 Units by mouth daily.   furosemide (LASIX) 20 MG tablet Take 1 tablet (20 mg total) by mouth daily.   latanoprost (XALATAN) 0.005 % ophthalmic solution Place 1 drop into both eyes at bedtime.   levothyroxine (SYNTHROID) 50 MCG tablet Take 1 tablet (50 mcg total) by mouth daily before breakfast.   losartan (COZAAR) 100 MG tablet Take 1 tablet (100 mg total) by mouth daily.   nystatin-triamcinolone ointment (MYCOLOG) Apply 1  application. topically 2 (two) times daily.   OXYGEN Inhale into the lungs. 2 litre AHP   SYMBICORT 160-4.5 MCG/ACT inhaler INHALE 2 PUFFS BY MOUTH TWICE DAILY   vitamin B-12 (CYANOCOBALAMIN) 1000 MCG tablet Take 1,000 mcg by mouth daily.   [DISCONTINUED] amLODipine (NORVASC) 5 MG tablet TAKE 1 TABLET(5 MG) BY MOUTH DAILY   [DISCONTINUED] fluticasone-salmeterol (ADVAIR DISKUS) 250-50 MCG/ACT AEPB INHALE 1 PUFF INTO THE LUNGS TWICE DAILY   No facility-administered encounter medications on file as of 05/08/2022.    Surgical History: Past Surgical History:  Procedure Laterality Date   ABDOMINAL HYSTERECTOMY     APPENDECTOMY     CHOLECYSTECTOMY N/A 03/12/2020   Procedure: LAPAROSCOPIC CHOLECYSTECTOMY;  Surgeon: Fredirick Maudlin, MD;  Location: [facility] ORS;  Service: General;  Laterality: N/A;   COLONOSCOPY WITH PROPOFOL N/A 07/09/2015   Procedure: COLONOSCOPY WITH PROPOFOL;  Surgeon: Manya Silvas, MD;  Location: [facility] ENDOSCOPY;  Service: Endoscopy;  Laterality: N/A;    Medical History: Past Medical History:  Diagnosis Date   Arthritis    Asthma    Cancer ([place])    skin, colon polyp   Chronic kidney disease    Complication of anesthesia    asthma attack per pt   COPD (chronic obstructive pulmonary disease) ([place])    Dyspnea    Hypertension    Hypothyroidism     Family History: Family History  Problem Relation Age of Onset  Heart attack Father    Early death Sister        MVA   Stomach cancer Brother    Cancer Brother        shoulder   Lung cancer Brother    Cancer Sister    Cancer Sister    Early death Sister        husband killed her   Breast cancer Neg Hx     Social History   Socioeconomic History   Marital status: Widowed    Spouse name: Not on file   Number of children: Not on file   Years of education: Not on file   Highest education level: Not on file  Occupational History   Not on file  Tobacco Use   Smoking status: Never   Smokeless tobacco: Never    Tobacco comments:    second hand smoke  Vaping Use   Vaping Use: Never used  Substance and Sexual Activity   Alcohol use: No   Drug use: No   Sexual activity: Not on file  Other Topics Concern   Not on file  Social History Narrative   Not on file   Social Determinants of Health   Financial Resource Strain: Low Risk  (12/15/2020)   Overall Financial Resource Strain (CARDIA)    Difficulty of Paying Living Expenses: Not very hard  Food Insecurity: Not on file  Transportation Needs: Not on file  Physical Activity: Not on file  Stress: Not on file  Social Connections: Not on file  Intimate Partner Violence: Not on file      Review of Systems  Constitutional:  Negative for chills, fatigue and unexpected weight change.  HENT:  Negative for congestion, postnasal drip, rhinorrhea, sneezing and sore throat.   Eyes:  Negative for redness.  Respiratory:  Positive for shortness of breath. Negative for cough, chest tightness and wheezing.   Cardiovascular:  Negative for chest pain and palpitations.  Gastrointestinal:  Negative for abdominal pain, constipation, diarrhea, nausea and vomiting.  Genitourinary:  Negative for dysuria and frequency.  Musculoskeletal:  Positive for arthralgias. Negative for back pain, joint swelling and neck pain.  Skin:  Negative for rash.  Neurological: Negative.  Negative for tremors and numbness.  Hematological:  Negative for adenopathy. Does not bruise/bleed easily.  Psychiatric/Behavioral:  Negative for behavioral problems (Depression), sleep disturbance and suicidal ideas. The patient is not nervous/anxious.     Vital Signs: BP 124/88   Pulse 69   Temp 98.4 F (36.9 C)   Resp 16   Ht '4\' 8"'$  (1.422 m)   Wt 125 lb 9.6 oz (57 kg)   SpO2 97%   BMI 28.16 kg/m    Physical Exam Vitals and nursing note reviewed.  Constitutional:      General: She is not in acute distress.    Appearance: Normal appearance. She is well-developed. She is not  diaphoretic.  HENT:     Head: Normocephalic and atraumatic.     Mouth/Throat:     Pharynx: No oropharyngeal exudate.  Eyes:     Pupils: Pupils are equal, round, and reactive to light.  Neck:     Thyroid: No thyromegaly.     Vascular: No JVD.     Trachea: No tracheal deviation.  Cardiovascular:     Rate and Rhythm: Normal rate and regular rhythm.     Heart sounds: Normal heart sounds. No murmur heard.    No friction rub. No gallop.  Pulmonary:     Effort:  Pulmonary effort is normal. No respiratory distress.     Breath sounds: Wheezing present. No rales.  Chest:     Chest wall: No tenderness.  Breasts:    Right: Normal. No mass.     Left: No mass.  Abdominal:     General: Bowel sounds are normal.     Palpations: Abdomen is soft.  Musculoskeletal:        General: Normal range of motion.     Cervical back: Normal range of motion and neck supple.     Right lower leg: No edema.     Left lower leg: No edema.  Lymphadenopathy:     Cervical: No cervical adenopathy.  Skin:    General: Skin is warm and dry.  Neurological:     Mental Status: She is alert and oriented to person, place, and time.     Cranial Nerves: No cranial nerve deficit.  Psychiatric:        Behavior: Behavior normal.        Thought Content: Thought content normal.        Judgment: Judgment normal.        Assessment/Plan: 1. Essential hypertension Stable, continue current medications  2. Hypertensive chronic kidney disease, unspecified CKD stage Followed by nephrology    General Counseling: Deina verbalizes understanding of the findings of todays visit and agrees with plan of treatment. I have discussed any further diagnostic evaluation that Regnier be needed or ordered today. We also reviewed her medications today. she has been encouraged to call the office with any questions or concerns that should arise related to todays visit.    No orders of the defined types were placed in this encounter.   No  orders of the defined types were placed in this encounter.   This patient was seen by Drema Dallas, PA-C in collaboration with Dr. Clayborn Bigness as a part of collaborative care agreement.   Total time spent:30 Minutes Time spent includes review of chart, medications, test results, and follow up plan with the patient.      Dr Lavera Guise Internal medicine

## 2022-05-12 DIAGNOSIS — J449 Chronic obstructive pulmonary disease, unspecified: Secondary | ICD-10-CM | POA: Diagnosis not present

## 2022-05-26 DIAGNOSIS — J449 Chronic obstructive pulmonary disease, unspecified: Secondary | ICD-10-CM | POA: Diagnosis not present

## 2022-06-11 DIAGNOSIS — J449 Chronic obstructive pulmonary disease, unspecified: Secondary | ICD-10-CM | POA: Diagnosis not present

## 2022-06-20 ENCOUNTER — Ambulatory Visit: Payer: PPO | Attending: Cardiology

## 2022-06-20 DIAGNOSIS — I350 Nonrheumatic aortic (valve) stenosis: Secondary | ICD-10-CM | POA: Diagnosis not present

## 2022-06-20 DIAGNOSIS — I5189 Other ill-defined heart diseases: Secondary | ICD-10-CM

## 2022-06-20 LAB — ECHOCARDIOGRAM COMPLETE
AR max vel: 0.96 cm2
AV Area VTI: 0.95 cm2
AV Area mean vel: 1.07 cm2
AV Mean grad: 14.7 mmHg
AV Peak grad: 29.2 mmHg
Ao pk vel: 2.7 m/s
Area-P 1/2: 3.17 cm2
P 1/2 time: 649 msec
S' Lateral: 2.6 cm
Single Plane A4C EF: 56.8 %

## 2022-06-23 ENCOUNTER — Ambulatory Visit: Payer: PPO | Attending: Cardiology | Admitting: Cardiology

## 2022-06-23 ENCOUNTER — Encounter: Payer: Self-pay | Admitting: Cardiology

## 2022-06-23 VITALS — BP 138/60 | HR 62 | Ht <= 58 in | Wt 124.4 lb

## 2022-06-23 DIAGNOSIS — I350 Nonrheumatic aortic (valve) stenosis: Secondary | ICD-10-CM

## 2022-06-23 DIAGNOSIS — I5189 Other ill-defined heart diseases: Secondary | ICD-10-CM

## 2022-06-23 DIAGNOSIS — I10 Essential (primary) hypertension: Secondary | ICD-10-CM

## 2022-06-23 NOTE — Patient Instructions (Signed)
Medication Instructions:  No changes *If you need a refill on your cardiac medications before your next appointment, please call your pharmacy*   Lab Work: None ordered If you have labs (blood work) drawn today and your tests are completely normal, you will receive your results only by: [place] (if you have MyChart) OR A paper copy in the mail If you have any lab test that is abnormal or we need to change your treatment, we will call you to review the results.   Testing/Procedures: None ordered   Follow-Up: At [facility], you and your health needs are our priority.  As part of our continuing mission to provide you with exceptional heart care, we have created designated Provider Care Teams.  These Care Teams include your primary Cardiologist (physician) and Advanced Practice Providers (APPs -  Physician Assistants and Nurse Practitioners) who all work together to provide you with the care you need, when you need it.  We recommend signing up for the patient portal called "MyChart".  Sign up information is provided on this After Visit Summary.  MyChart is used to connect with patients for Virtual Visits (Telemedicine).  Patients are able to view lab/test results, encounter notes, upcoming appointments, etc.  Non-urgent messages can be sent to your provider as well.   To learn more about what you can do with MyChart, go to [email or web address].    Your next appointment:   6 month(s)  The format for your next appointment:   In Person  Provider:   You [place] see Kate Sable, MD or one of the following Advanced Practice Providers on your designated Care Team:   Murray Hodgkins, NP Christell Faith, PA-C Cadence Kathlen Mody, PA-C Gerrie Nordmann, NP    Important Information About Sugar

## 2022-06-23 NOTE — Progress Notes (Signed)
Cardiology Office Note:    Date:  06/23/2022   ID:  Brenda Tucker, DOB [id number]-Hafley-1935, MRN [id number]  PCP:  Brenda Latina, PA-C  [facility] Cardiologist:  Kate Sable, MD  [place] Electrophysiologist:  None   Referring MD: Brenda Latina, PA*   Chief Complaint  Patient presents with   Follow-up    8 month follow visit. Patient states that she gives out of breath easily.  Meds reviewed with patient.     History of Present Illness:    Brenda Tucker is a 88 y.o. 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   -Repeat echocardiogram for aortic valve stenosis monitoring.  States having shortness of breath with exertion, ongoing over the past year or so.  Denies edema.  Compliant with medications including Lasix as prescribed.  Denies orthopnea.  Prior notes Echo 29/92/4268 normal systolic function, grade 2 diastolic dysfunction, EF 55 to 60% Lexiscan Myoview 07/2020, no evidence for ischemia, coronary artery calcifications in the LAD, RCA. Outside echocardiogram on 01/2018 showed normal systolic function, EF 34%, aortic valve calcification. Stop Lipitor in the past due to diarrhea. Episode of A. fib in the context of Lexiscan usage.  Follow-up cardiac monitor with no recurrence of A. fib.  Past Medical History:  Diagnosis Date   Arthritis    Asthma    Cancer ([place])    skin, colon polyp   Chronic kidney disease    Complication of anesthesia    asthma attack per pt   COPD (chronic obstructive pulmonary disease) ([place])    Dyspnea    Hypertension    Hypothyroidism     Past Surgical History:  Procedure Laterality Date   ABDOMINAL HYSTERECTOMY     APPENDECTOMY     CHOLECYSTECTOMY N/A 03/12/2020   Procedure: LAPAROSCOPIC CHOLECYSTECTOMY;  Surgeon: Fredirick Maudlin, MD;  Location: [facility] ORS;  Service: General;  Laterality: N/A;   COLONOSCOPY WITH  PROPOFOL N/A 07/09/2015   Procedure: COLONOSCOPY WITH PROPOFOL;  Surgeon: Manya Silvas, MD;  Location: [facility];  Service: Endoscopy;  Laterality: N/A;    Current Medications: Current Meds  Medication Sig   acetaminophen (TYLENOL) 500 MG tablet Take 500 mg by mouth every 6 (six) hours as needed for moderate pain or headache.   albuterol (VENTOLIN HFA) 108 (90 Base) MCG/ACT inhaler INHALE 2 PUFFS INTO THE LUNGS EVERY 6 HOURS AS NEEDED FOR WHEEZING OR SHORTNESS OF BREATH   amLODipine (NORVASC) 2.5 MG tablet Take 2.5 mg by mouth daily.   aspirin EC 81 MG tablet Take 81 mg by mouth daily.   atorvastatin (LIPITOR) 10 MG tablet TAKE 1 TABLET BY MOUTH EVERY OTHER DAY   calcium carbonate (OSCAL) 1500 (600 Ca) MG TABS tablet Take 600 mg of elemental calcium by mouth daily.   carvedilol (COREG) 25 MG tablet Take 0.5 tablets (12.5 mg total) by mouth 2 (two) times daily with a meal.   cephALEXin (KEFLEX) 250 MG capsule Take 1 capsule (250 mg total) by mouth 3 (three) times daily.   Cholecalciferol (VITAMIN D) 50 MCG (2000 UT) tablet Take 2,000 Units by mouth daily.   furosemide (LASIX) 20 MG tablet Take 1 tablet (20 mg total) by mouth daily.   latanoprost (XALATAN) 0.005 % ophthalmic solution Place 1 drop into both eyes at bedtime.   levothyroxine (SYNTHROID) 50 MCG tablet Take 1 tablet (50 mcg total) by mouth  daily before breakfast.   losartan (COZAAR) 100 MG tablet Take 1 tablet (100 mg total) by mouth daily.   nystatin-triamcinolone ointment (MYCOLOG) Apply 1 application. topically 2 (two) times daily.   OXYGEN Inhale into the lungs. 2 litre AHP   SYMBICORT 160-4.5 MCG/ACT inhaler INHALE 2 PUFFS BY MOUTH TWICE DAILY   vitamin B-12 (CYANOCOBALAMIN) 1000 MCG tablet Take 1,000 mcg by mouth daily.     Allergies:   Theodrenaline   Social History   Socioeconomic History   Marital status: Widowed    Spouse name: Not on file   Number of children: Not on file   Years of education: Not on file    Highest education level: Not on file  Occupational History   Not on file  Tobacco Use   Smoking status: Never   Smokeless tobacco: Never   Tobacco comments:    second hand smoke  Vaping Use   Vaping Use: Never used  Substance and Sexual Activity   Alcohol use: No   Drug use: No   Sexual activity: Not on file  Other Topics Concern   Not on file  Social History Narrative   Not on file   Social Determinants of Health   Financial Resource Strain: Low Risk  (12/15/2020)   Overall Financial Resource Strain (CARDIA)    Difficulty of Paying Living Expenses: Not very hard  Food Insecurity: Not on file  Transportation Needs: Not on file  Physical Activity: Not on file  Stress: Not on file  Social Connections: Not on file     Family History: The patient's family history includes Cancer in her brother, sister, and sister; Early death in her sister and sister; Heart attack in her father; Lung cancer in her brother; Stomach cancer in her brother. There is no history of Breast cancer.  ROS:   Please see the history of present illness.     All other systems reviewed and are negative.  EKGs/Labs/Other Studies Reviewed:    The following studies were reviewed today:   EKG:  EKG is ordered today.  EKG shows sinus rhythm with PACs.  Right bundle branch block.  Recent Labs: 08/16/2021: BUN 22; Creatinine, Ser 1.36; Potassium 3.8; Sodium 138  Recent Lipid Panel    Component Value Date/Time   CHOL 107 07/16/2020 1148   CHOL 212 (H) 12/31/2019 0853   TRIG 89 07/16/2020 1148   HDL 56 07/16/2020 1148   HDL 60 12/31/2019 0853   CHOLHDL 1.9 07/16/2020 1148   VLDL 18 07/16/2020 1148   LDLCALC 33 07/16/2020 1148   LDLCALC 130 (H) 12/31/2019 0853     Risk Assessment/Calculations:      Physical Exam:    VS:  BP 138/60 (BP Location: Left Arm, Patient Position: Sitting, Cuff Size: Normal)   Pulse 62   Ht '4\' 8"'$  (1.422 m)   Wt 124 lb 6.4 oz (56.4 kg)   SpO2 95%   BMI 27.89 kg/m      Wt Readings from Last 3 Encounters:  06/23/22 124 lb 6.4 oz (56.4 kg)  05/08/22 125 lb 9.6 oz (57 kg)  02/16/22 130 lb (59 kg)     GEN:  Well nourished, well developed in no acute distress HEENT: Normal NECK: No JVD; No carotid bruits CARDIAC: RRR, 2/6 systolic murmur RESPIRATORY: Diminished breath sounds bilaterally, no wheezing. ABDOMEN: Soft, non-tender, non-distended MUSCULOSKELETAL: no edema; No deformity  SKIN: Warm and dry NEUROLOGIC:  Alert and oriented x 3 PSYCHIATRIC:  Normal affect   ASSESSMENT:  1. Primary hypertension   2. Aortic valve stenosis, etiology of cardiac valve disease unspecified   3. Diastolic dysfunction     PLAN:    In order of problems listed above:  hypertension, BP controlled..  Continue Coreg 12.5 mg twice daily, losartan, Norvasc.   Mild to moderate aortic valve stenosis, no significant change on repeat echo 06/20/2022 EF 60 to 65%.  Monitor with serial echocardiograms yearly. grade 2 diastolic dysfunction, cough.  Increase Lasix to 40 mg daily x 3 days then return to 20 mg daily.  Dyspnea could be secondary to worsening COPD.  Also has sleep apnea, uses CPAP at night..  Follow-up in 6 months.   Shared Decision Making/Informed Consent      Medication Adjustments/Labs and Tests Ordered: Current medicines are reviewed at length with the patient today.  Concerns regarding medicines are outlined above.  Orders Placed This Encounter  Procedures   EKG 12-Lead    No orders of the defined types were placed in this encounter.    Patient Instructions  Medication Instructions:  No changes *If you need a refill on your cardiac medications before your next appointment, please call your pharmacy*   Lab Work: None ordered If you have labs (blood work) drawn today and your tests are completely normal, you will receive your results only by: [place] (if you have MyChart) OR A paper copy in the mail If you have any lab test that  is abnormal or we need to change your treatment, we will call you to review the results.   Testing/Procedures: None ordered   Follow-Up: At [facility], you and your health needs are our priority.  As part of our continuing mission to provide you with exceptional heart care, we have created designated Provider Care Teams.  These Care Teams include your primary Cardiologist (physician) and Advanced Practice Providers (APPs -  Physician Assistants and Nurse Practitioners) who all work together to provide you with the care you need, when you need it.  We recommend signing up for the patient portal called "MyChart".  Sign up information is provided on this After Visit Summary.  MyChart is used to connect with patients for Virtual Visits (Telemedicine).  Patients are able to view lab/test results, encounter notes, upcoming appointments, etc.  Non-urgent messages can be sent to your provider as well.   To learn more about what you can do with MyChart, go to [email or web address].    Your next appointment:   6 month(s)  The format for your next appointment:   In Person  Provider:   You [place] see Kate Sable, MD or one of the following Advanced Practice Providers on your designated Care Team:   Murray Hodgkins, NP Christell Faith, PA-C Cadence Kathlen Mody, PA-C Gerrie Nordmann, NP    Important Information About Sugar         Signed, Kate Sable, MD  06/23/2022 10:55 AM    [place]

## 2022-06-25 DIAGNOSIS — J449 Chronic obstructive pulmonary disease, unspecified: Secondary | ICD-10-CM | POA: Diagnosis not present

## 2022-07-12 DIAGNOSIS — J449 Chronic obstructive pulmonary disease, unspecified: Secondary | ICD-10-CM | POA: Diagnosis not present

## 2022-07-21 ENCOUNTER — Other Ambulatory Visit: Payer: Self-pay | Admitting: Physician Assistant

## 2022-07-21 DIAGNOSIS — E039 Hypothyroidism, unspecified: Secondary | ICD-10-CM

## 2022-07-26 DIAGNOSIS — J449 Chronic obstructive pulmonary disease, unspecified: Secondary | ICD-10-CM | POA: Diagnosis not present

## 2022-07-27 ENCOUNTER — Other Ambulatory Visit: Payer: Self-pay | Admitting: Internal Medicine

## 2022-07-27 DIAGNOSIS — J4489 Other specified chronic obstructive pulmonary disease: Secondary | ICD-10-CM

## 2022-07-28 ENCOUNTER — Other Ambulatory Visit: Payer: Self-pay | Admitting: Physician Assistant

## 2022-07-28 DIAGNOSIS — E039 Hypothyroidism, unspecified: Secondary | ICD-10-CM

## 2022-07-31 ENCOUNTER — Other Ambulatory Visit: Payer: Self-pay

## 2022-07-31 MED ORDER — FUROSEMIDE 20 MG PO TABS: 20.0000 mg | ORAL_TABLET | Freq: Every day | ORAL | 3 refills | Status: DC

## 2022-08-12 DIAGNOSIS — J449 Chronic obstructive pulmonary disease, unspecified: Secondary | ICD-10-CM | POA: Diagnosis not present

## 2022-08-15 DIAGNOSIS — R8290 Unspecified abnormal findings in urine: Secondary | ICD-10-CM | POA: Diagnosis not present

## 2022-08-15 DIAGNOSIS — I10 Essential (primary) hypertension: Secondary | ICD-10-CM | POA: Diagnosis not present

## 2022-08-15 DIAGNOSIS — I129 Hypertensive chronic kidney disease with stage 1 through stage 4 chronic kidney disease, or unspecified chronic kidney disease: Secondary | ICD-10-CM | POA: Diagnosis not present

## 2022-08-15 DIAGNOSIS — D631 Anemia in chronic kidney disease: Secondary | ICD-10-CM | POA: Diagnosis not present

## 2022-08-15 DIAGNOSIS — N184 Chronic kidney disease, stage 4 (severe): Secondary | ICD-10-CM | POA: Diagnosis not present

## 2022-08-17 ENCOUNTER — Ambulatory Visit (INDEPENDENT_AMBULATORY_CARE_PROVIDER_SITE_OTHER): Payer: PPO | Admitting: Nurse Practitioner

## 2022-08-17 ENCOUNTER — Encounter: Payer: Self-pay | Admitting: Nurse Practitioner

## 2022-08-17 VITALS — BP 134/58 | HR 72 | Temp 97.4°F | Resp 16 | Ht <= 58 in | Wt 123.6 lb

## 2022-08-17 DIAGNOSIS — G4733 Obstructive sleep apnea (adult) (pediatric): Secondary | ICD-10-CM

## 2022-08-17 DIAGNOSIS — Z7189 Other specified counseling: Secondary | ICD-10-CM

## 2022-08-17 DIAGNOSIS — J4489 Other specified chronic obstructive pulmonary disease: Secondary | ICD-10-CM

## 2022-08-17 MED ORDER — ALBUTEROL SULFATE HFA 108 (90 BASE) MCG/ACT IN AERS: 2.0000 | INHALATION_SPRAY | Freq: Four times a day (QID) | RESPIRATORY_TRACT | 3 refills | Status: DC | PRN

## 2022-08-17 MED ORDER — TRAMADOL HCL 50 MG PO TABS: 25.0000 mg | ORAL_TABLET | Freq: Four times a day (QID) | ORAL | 0 refills | Status: DC | PRN

## 2022-08-17 MED ORDER — BUDESONIDE-FORMOTEROL FUMARATE 160-4.5 MCG/ACT IN AERO: 2.0000 | INHALATION_SPRAY | Freq: Two times a day (BID) | RESPIRATORY_TRACT | 1 refills | Status: DC

## 2022-08-17 NOTE — Progress Notes (Signed)
[facility] [place], [place] [address]  Internal MEDICINE  Office Visit Note  Patient Name: Brenda Tucker  [id number]  [id number]  Date of Service: 08/17/2022  Chief Complaint  Patient presents with   Hypertension   Follow-up    HPI Brenda Tucker presents for a follow up pulmonary visit. --currently using 2 LPM supplemental oxygen, has a cough sometimes, no recent hospital admissions --consistently using her symbicort inhaler and her CPAP. Has albuterol inhaler as needed.  --reports compliance with CPAP, no download available. Does not need supplies at this time. No issues to report, states she uses the machine 4 or more hours per night.       Current Medication: Outpatient Encounter Medications as of 08/17/2022  Medication Sig   acetaminophen (TYLENOL) 500 MG tablet Take 500 mg by mouth every 6 (six) hours as needed for moderate pain or headache.   amLODipine (NORVASC) 2.5 MG tablet Take 2.5 mg by mouth daily.   aspirin EC 81 MG tablet Take 81 mg by mouth daily.   atorvastatin (LIPITOR) 10 MG tablet TAKE 1 TABLET BY MOUTH EVERY OTHER DAY   calcium carbonate (OSCAL) 1500 (600 Ca) MG TABS tablet Take 600 mg of elemental calcium by mouth daily.   carvedilol (COREG) 25 MG tablet Take 0.5 tablets (12.5 mg total) by mouth 2 (two) times daily with a meal.   cephALEXin (KEFLEX) 250 MG capsule Take 1 capsule (250 mg total) by mouth 3 (three) times daily.   Cholecalciferol (VITAMIN D) 50 MCG (2000 UT) tablet Take 2,000 Units by mouth daily.   furosemide (LASIX) 20 MG tablet Take 1 tablet (20 mg total) by mouth daily.   latanoprost (XALATAN) 0.005 % ophthalmic solution Place 1 drop into both eyes at bedtime.   levothyroxine (SYNTHROID) 50 MCG tablet TAKE 1 TABLET(50 MCG) BY MOUTH DAILY BEFORE BREAKFAST   losartan (COZAAR) 100 MG tablet Take 1 tablet (100 mg total) by mouth daily.   nystatin-triamcinolone ointment (MYCOLOG) Apply 1 application. topically 2 (two) times daily.    OXYGEN Inhale into the lungs. 2 litre AHP   vitamin B-12 (CYANOCOBALAMIN) 1000 MCG tablet Take 1,000 mcg by mouth daily.   [DISCONTINUED] albuterol (VENTOLIN HFA) 108 (90 Base) MCG/ACT inhaler INHALE 2 PUFFS INTO THE LUNGS EVERY 6 HOURS AS NEEDED FOR WHEEZING OR SHORTNESS OF BREATH   [DISCONTINUED] budesonide-formoterol (SYMBICORT) 160-4.5 MCG/ACT inhaler INHALE 2 PUFFS BY MOUTH TWICE DAILY   albuterol (VENTOLIN HFA) 108 (90 Base) MCG/ACT inhaler Inhale 2 puffs into the lungs every 6 (six) hours as needed for wheezing or shortness of breath.   budesonide-formoterol (SYMBICORT) 160-4.5 MCG/ACT inhaler Inhale 2 puffs into the lungs 2 (two) times daily.   traMADol (ULTRAM) 50 MG tablet Take 0.5-1 tablets (25-50 mg total) by mouth every 6 (six) hours as needed for moderate pain or severe pain.   [DISCONTINUED] fluticasone-salmeterol (ADVAIR DISKUS) 250-50 MCG/ACT AEPB INHALE 1 PUFF INTO THE LUNGS TWICE DAILY   No facility-administered encounter medications on file as of 08/17/2022.    Surgical History: Past Surgical History:  Procedure Laterality Date   ABDOMINAL HYSTERECTOMY     APPENDECTOMY     CHOLECYSTECTOMY N/A 03/12/2020   Procedure: LAPAROSCOPIC CHOLECYSTECTOMY;  Surgeon: Fredirick Maudlin, MD;  Location: [facility] ORS;  Service: General;  Laterality: N/A;   COLONOSCOPY WITH PROPOFOL N/A 07/09/2015   Procedure: COLONOSCOPY WITH PROPOFOL;  Surgeon: Manya Silvas, MD;  Location: [facility] ENDOSCOPY;  Service: Endoscopy;  Laterality: N/A;    Medical History: Past  Medical History:  Diagnosis Date   Arthritis    Asthma    Cancer ([place])    skin, colon polyp   Chronic kidney disease    Complication of anesthesia    asthma attack per pt   COPD (chronic obstructive pulmonary disease) ([place])    Dyspnea    Hypertension    Hypothyroidism     Family History: Family History  Problem Relation Age of Onset   Heart attack Father    Early death Sister        MVA   Stomach cancer Brother    Cancer  Brother        shoulder   Lung cancer Brother    Cancer Sister    Cancer Sister    Early death Sister        husband killed her   Breast cancer Neg Hx     Social History   Socioeconomic History   Marital status: Widowed    Spouse name: Not on file   Number of children: Not on file   Years of education: Not on file   Highest education level: Not on file  Occupational History   Not on file  Tobacco Use   Smoking status: Never   Smokeless tobacco: Never   Tobacco comments:    second hand smoke  Vaping Use   Vaping Use: Never used  Substance and Sexual Activity   Alcohol use: No   Drug use: No   Sexual activity: Not on file  Other Topics Concern   Not on file  Social History Narrative   Not on file   Social Determinants of Health   Financial Resource Strain: Low Risk  (12/15/2020)   Overall Financial Resource Strain (CARDIA)    Difficulty of Paying Living Expenses: Not very hard  Food Insecurity: Not on file  Transportation Needs: Not on file  Physical Activity: Not on file  Stress: Not on file  Social Connections: Not on file  Intimate Partner Violence: Not on file      Review of Systems  Constitutional:  Negative for chills, fatigue and unexpected weight change.  HENT:  Negative for congestion, postnasal drip, rhinorrhea, sneezing and sore throat.   Respiratory:  Positive for shortness of breath (intermittent). Negative for cough, chest tightness and wheezing.   Cardiovascular:  Negative for chest pain and palpitations.  Gastrointestinal:  Negative for abdominal pain, constipation, diarrhea, nausea and vomiting.  Genitourinary:  Negative for dysuria and frequency.  Skin:  Negative for rash.  Neurological: Negative.  Negative for tremors and numbness.  Psychiatric/Behavioral:  Behavioral problem: Depression.     Vital Signs: BP (!) 134/58   Pulse 72   Temp (!) 97.4 F (36.3 C)   Resp 16   Ht 4' 8"$  (1.422 m)   Wt 123 lb 9.6 oz (56.1 kg)   SpO2 96%    BMI 27.71 kg/m    Physical Exam Vitals reviewed.  Constitutional:      General: She is not in acute distress.    Appearance: Normal appearance. She is well-developed and normal weight. She is not diaphoretic.  HENT:     Head: Normocephalic and atraumatic.  Eyes:     Pupils: Pupils are equal, round, and reactive to light.  Neck:     Thyroid: No thyromegaly.     Vascular: No JVD.     Trachea: No tracheal deviation.  Cardiovascular:     Rate and Rhythm: Normal rate and regular rhythm.  Heart sounds: Normal heart sounds. No murmur heard.    No friction rub. No gallop.  Pulmonary:     Effort: Pulmonary effort is normal. No respiratory distress.     Breath sounds: Normal breath sounds. No wheezing or rales.  Chest:     Chest wall: No tenderness.  Lymphadenopathy:     Cervical: No cervical adenopathy.  Neurological:     Mental Status: She is alert and oriented to person, place, and time.  Psychiatric:        Mood and Affect: Mood normal.        Behavior: Behavior normal.        Assessment/Plan: 1. Obstructive chronic bronchitis without exacerbation ([place]) Continue symbicort as prescribed. No significant changes in respiratory status. Follow up in 6 months   2. OSA on CPAP Continue using CPAP as instructed and supplemental oxygen. Follow up in 6 months   3. CPAP use counseling Discussed CPAP use, no issues, no concerns, does not need supplies  General Counseling: Brenda Tucker verbalizes understanding of the findings of todays visit and agrees with plan of treatment. I have discussed any further diagnostic evaluation that Brenda Tucker be needed or ordered today. We also reviewed her medications today. she has been encouraged to call the office with any questions or concerns that should arise related to todays visit.    No orders of the defined types were placed in this encounter.   Meds ordered this encounter  Medications   albuterol (VENTOLIN HFA) 108 (90 Base) MCG/ACT inhaler     Sig: Inhale 2 puffs into the lungs every 6 (six) hours as needed for wheezing or shortness of breath.    Dispense:  18 g    Refill:  3   budesonide-formoterol (SYMBICORT) 160-4.5 MCG/ACT inhaler    Sig: Inhale 2 puffs into the lungs 2 (two) times daily.    Dispense:  10.2 g    Refill:  1   traMADol (ULTRAM) 50 MG tablet    Sig: Take 0.5-1 tablets (25-50 mg total) by mouth every 6 (six) hours as needed for moderate pain or severe pain.    Dispense:  20 tablet    Refill:  0    Return in about 6 months (around 02/15/2023) for F/U, pulmonary only, Brenda Tucker.   Total time spent:30 Minutes Time spent includes review of chart, medications, test results, and follow up plan with the patient.   [place] Controlled Substance Database was reviewed by me.  This patient was seen by Brenda Osgood, FNP-C in collaboration with Dr. Clayborn Bigness as a part of collaborative care agreement.   Mckynlie Vanderslice R. Valetta Fuller, MSN, FNP-C Internal medicine

## 2022-08-20 ENCOUNTER — Encounter: Payer: Self-pay | Admitting: Nurse Practitioner

## 2022-08-22 DIAGNOSIS — I129 Hypertensive chronic kidney disease with stage 1 through stage 4 chronic kidney disease, or unspecified chronic kidney disease: Secondary | ICD-10-CM | POA: Diagnosis not present

## 2022-08-22 DIAGNOSIS — N1832 Chronic kidney disease, stage 3b: Secondary | ICD-10-CM | POA: Diagnosis not present

## 2022-08-22 DIAGNOSIS — I10 Essential (primary) hypertension: Secondary | ICD-10-CM | POA: Diagnosis not present

## 2022-08-22 DIAGNOSIS — R809 Proteinuria, unspecified: Secondary | ICD-10-CM | POA: Diagnosis not present

## 2022-08-26 DIAGNOSIS — J449 Chronic obstructive pulmonary disease, unspecified: Secondary | ICD-10-CM | POA: Diagnosis not present

## 2022-08-30 DIAGNOSIS — H353132 Nonexudative age-related macular degeneration, bilateral, intermediate dry stage: Secondary | ICD-10-CM | POA: Diagnosis not present

## 2022-08-30 DIAGNOSIS — H40003 Preglaucoma, unspecified, bilateral: Secondary | ICD-10-CM | POA: Diagnosis not present

## 2022-09-04 ENCOUNTER — Ambulatory Visit (INDEPENDENT_AMBULATORY_CARE_PROVIDER_SITE_OTHER): Payer: PPO | Admitting: Physician Assistant

## 2022-09-04 ENCOUNTER — Encounter: Payer: Self-pay | Admitting: Physician Assistant

## 2022-09-04 VITALS — BP 138/70 | HR 77 | Temp 98.0°F | Resp 16 | Ht <= 58 in | Wt 124.0 lb

## 2022-09-04 DIAGNOSIS — I10 Essential (primary) hypertension: Secondary | ICD-10-CM

## 2022-09-04 DIAGNOSIS — J4489 Other specified chronic obstructive pulmonary disease: Secondary | ICD-10-CM | POA: Diagnosis not present

## 2022-09-04 DIAGNOSIS — I129 Hypertensive chronic kidney disease with stage 1 through stage 4 chronic kidney disease, or unspecified chronic kidney disease: Secondary | ICD-10-CM

## 2022-09-04 NOTE — Progress Notes (Signed)
[facility] [place], [place] [address]  Internal MEDICINE  Office Visit Note  Patient Name: Brenda Tucker  [id number]  [id number]  Date of Service: 09/04/2022  Chief Complaint  Patient presents with   Follow-up   Hypertension    HPI Pt is here for routine follow up -Nephrology put her on magnesium, but otherwise no changes -Still wearing cpap with oxygen at night -using inhalers as prescribed -Cardiology did repeat echo in Dec, goes back in June -Did go to eye doctor recently and is getting new glasses  Current Medication: Outpatient Encounter Medications as of 09/04/2022  Medication Sig   acetaminophen (TYLENOL) 500 MG tablet Take 500 mg by mouth every 6 (six) hours as needed for moderate pain or headache.   albuterol (VENTOLIN HFA) 108 (90 Base) MCG/ACT inhaler Inhale 2 puffs into the lungs every 6 (six) hours as needed for wheezing or shortness of breath.   amLODipine (NORVASC) 2.5 MG tablet Take 2.5 mg by mouth daily.   aspirin EC 81 MG tablet Take 81 mg by mouth daily.   atorvastatin (LIPITOR) 10 MG tablet TAKE 1 TABLET BY MOUTH EVERY OTHER DAY   budesonide-formoterol (SYMBICORT) 160-4.5 MCG/ACT inhaler Inhale 2 puffs into the lungs 2 (two) times daily.   calcium carbonate (OSCAL) 1500 (600 Ca) MG TABS tablet Take 600 mg of elemental calcium by mouth daily.   carvedilol (COREG) 25 MG tablet Take 0.5 tablets (12.5 mg total) by mouth 2 (two) times daily with a meal.   cephALEXin (KEFLEX) 250 MG capsule Take 1 capsule (250 mg total) by mouth 3 (three) times daily.   Cholecalciferol (VITAMIN D) 50 MCG (2000 UT) tablet Take 2,000 Units by mouth daily.   furosemide (LASIX) 20 MG tablet Take 1 tablet (20 mg total) by mouth daily.   latanoprost (XALATAN) 0.005 % ophthalmic solution Place 1 drop into both eyes at bedtime.   levothyroxine (SYNTHROID) 50 MCG tablet TAKE 1 TABLET(50 MCG) BY MOUTH DAILY BEFORE BREAKFAST   losartan (COZAAR) 100 MG tablet Take 1  tablet (100 mg total) by mouth daily.   nystatin-triamcinolone ointment (MYCOLOG) Apply 1 application. topically 2 (two) times daily.   OXYGEN Inhale into the lungs. 2 litre AHP   traMADol (ULTRAM) 50 MG tablet Take 0.5-1 tablets (25-50 mg total) by mouth every 6 (six) hours as needed for moderate pain or severe pain.   vitamin B-12 (CYANOCOBALAMIN) 1000 MCG tablet Take 1,000 mcg by mouth daily.   [DISCONTINUED] fluticasone-salmeterol (ADVAIR DISKUS) 250-50 MCG/ACT AEPB INHALE 1 PUFF INTO THE LUNGS TWICE DAILY   No facility-administered encounter medications on file as of 09/04/2022.    Surgical History: Past Surgical History:  Procedure Laterality Date   ABDOMINAL HYSTERECTOMY     APPENDECTOMY     CHOLECYSTECTOMY N/A 03/12/2020   Procedure: LAPAROSCOPIC CHOLECYSTECTOMY;  Surgeon: Fredirick Maudlin, MD;  Location: [facility] ORS;  Service: General;  Laterality: N/A;   COLONOSCOPY WITH PROPOFOL N/A 07/09/2015   Procedure: COLONOSCOPY WITH PROPOFOL;  Surgeon: Manya Silvas, MD;  Location: [facility] ENDOSCOPY;  Service: Endoscopy;  Laterality: N/A;    Medical History: Past Medical History:  Diagnosis Date   Arthritis    Asthma    Cancer ([place])    skin, colon polyp   Chronic kidney disease    Complication of anesthesia    asthma attack per pt   COPD (chronic obstructive pulmonary disease) ([facility])    Dyspnea    Hypertension    Hypothyroidism  Family History: Family History  Problem Relation Age of Onset   Heart attack Father    Early death Sister        MVA   Stomach cancer Brother    Cancer Brother        shoulder   Lung cancer Brother    Cancer Sister    Cancer Sister    Early death Sister        husband killed her   Breast cancer Neg Hx     Social History   Socioeconomic History   Marital status: Widowed    Spouse name: Not on file   Number of children: Not on file   Years of education: Not on file   Highest education level: Not on file  Occupational History   Not on  file  Tobacco Use   Smoking status: Never   Smokeless tobacco: Never   Tobacco comments:    second hand smoke  Vaping Use   Vaping Use: Never used  Substance and Sexual Activity   Alcohol use: No   Drug use: No   Sexual activity: Not on file  Other Topics Concern   Not on file  Social History Narrative   Not on file   Social Determinants of Health   Financial Resource Strain: Low Risk  (12/15/2020)   Overall Financial Resource Strain (CARDIA)    Difficulty of Paying Living Expenses: Not very hard  Food Insecurity: Not on file  Transportation Needs: Not on file  Physical Activity: Not on file  Stress: Not on file  Social Connections: Not on file  Intimate Partner Violence: Not on file      Review of Systems  Constitutional:  Negative for chills, fatigue and unexpected weight change.  HENT:  Negative for congestion, postnasal drip, rhinorrhea, sneezing and sore throat.   Respiratory:  Positive for shortness of breath (intermittent). Negative for cough, chest tightness and wheezing.   Cardiovascular:  Negative for chest pain and palpitations.  Gastrointestinal:  Negative for abdominal pain, constipation, diarrhea, nausea and vomiting.  Genitourinary:  Negative for dysuria and frequency.  Musculoskeletal:  Positive for arthralgias.  Skin:  Negative for rash.  Neurological: Negative.  Negative for tremors and numbness.  Psychiatric/Behavioral:  Negative for self-injury and sleep disturbance. Behavioral problem: Depression.The patient is not nervous/anxious.     Vital Signs: BP 138/70   Pulse 77   Temp 98 F (36.7 C)   Resp 16   Ht '4\' 8"'$  (1.422 m)   Wt 124 lb (56.2 kg)   SpO2 96% Comment: room air  BMI 27.80 kg/m    Physical Exam Vitals reviewed.  Constitutional:      General: She is not in acute distress.    Appearance: Normal appearance. She is well-developed and normal weight. She is not diaphoretic.  HENT:     Head: Normocephalic and atraumatic.  Eyes:      Pupils: Pupils are equal, round, and reactive to light.  Neck:     Thyroid: No thyromegaly.     Vascular: No JVD.     Trachea: No tracheal deviation.  Cardiovascular:     Rate and Rhythm: Normal rate and regular rhythm.     Heart sounds: Normal heart sounds. No murmur heard.    No friction rub. No gallop.  Pulmonary:     Effort: Pulmonary effort is normal. No respiratory distress.     Breath sounds: Normal breath sounds. No wheezing or rales.  Chest:     Chest  wall: No tenderness.  Abdominal:     General: Abdomen is flat.  Lymphadenopathy:     Cervical: No cervical adenopathy.  Skin:    General: Skin is warm and dry.  Neurological:     Mental Status: She is alert and oriented to person, place, and time.  Psychiatric:        Mood and Affect: Mood normal.        Behavior: Behavior normal.        Assessment/Plan: 1. Essential hypertension Well controlled, continue current medications  2. Hypertensive chronic kidney disease, unspecified CKD stage Followed by nephrology  3. Obstructive chronic bronchitis without exacerbation Continue inhalers as before, followed by pulmonology   General Counseling: Nidia verbalizes understanding of the findings of todays visit and agrees with plan of treatment. I have discussed any further diagnostic evaluation that Deleeuw be needed or ordered today. We also reviewed her medications today. she has been encouraged to call the office with any questions or concerns that should arise related to todays visit.    No orders of the defined types were placed in this encounter.   No orders of the defined types were placed in this encounter.   This patient was seen by Drema Dallas, PA-C in collaboration with Dr. Clayborn Bigness as a part of collaborative care agreement.   Total time spent:30 Minutes Time spent includes review of chart, medications, test results, and follow up plan with the patient.      Dr Lavera Guise Internal medicine

## 2022-09-15 ENCOUNTER — Other Ambulatory Visit: Payer: Self-pay | Admitting: *Deleted

## 2022-09-15 MED ORDER — CARVEDILOL 25 MG PO TABS: 12.5000 mg | ORAL_TABLET | Freq: Two times a day (BID) | ORAL | 0 refills | Status: DC

## 2022-09-19 DIAGNOSIS — G4733 Obstructive sleep apnea (adult) (pediatric): Secondary | ICD-10-CM | POA: Diagnosis not present

## 2022-09-20 ENCOUNTER — Telehealth: Payer: Self-pay | Admitting: Nurse Practitioner

## 2022-09-20 NOTE — Telephone Encounter (Signed)
Lvm to schedule appointment for 6 minute walk-Toni

## 2022-09-21 ENCOUNTER — Encounter: Payer: Self-pay | Admitting: Nurse Practitioner

## 2022-09-21 ENCOUNTER — Ambulatory Visit (INDEPENDENT_AMBULATORY_CARE_PROVIDER_SITE_OTHER): Payer: PPO | Admitting: Nurse Practitioner

## 2022-09-21 VITALS — BP 160/70 | HR 77 | Temp 97.7°F | Resp 16 | Ht <= 58 in | Wt 123.0 lb

## 2022-09-21 DIAGNOSIS — R0602 Shortness of breath: Secondary | ICD-10-CM | POA: Diagnosis not present

## 2022-09-21 DIAGNOSIS — J449 Chronic obstructive pulmonary disease, unspecified: Secondary | ICD-10-CM | POA: Diagnosis not present

## 2022-09-21 NOTE — Progress Notes (Signed)
[facility] [place], [place] [address]  Internal MEDICINE  Office Visit Note  Patient Name: Brenda Tucker  [id number]  [id number]  Date of Service: 09/21/2022  Chief Complaint  Patient presents with   Follow-up    O2 re certification     HPI Luzia presents for a follow-up visit for home oxygen recertification.  31min walk dropped to 88% during test. Recovered to 96%  on 2 LPM oxygen via nasal cannula.     Current Medication: Outpatient Encounter Medications as of 09/21/2022  Medication Sig   acetaminophen (TYLENOL) 500 MG tablet Take 500 mg by mouth every 6 (six) hours as needed for moderate pain or headache.   albuterol (VENTOLIN HFA) 108 (90 Base) MCG/ACT inhaler Inhale 2 puffs into the lungs every 6 (six) hours as needed for wheezing or shortness of breath.   amLODipine (NORVASC) 2.5 MG tablet Take 2.5 mg by mouth daily.   aspirin EC 81 MG tablet Take 81 mg by mouth daily.   atorvastatin (LIPITOR) 10 MG tablet TAKE 1 TABLET BY MOUTH EVERY OTHER DAY   budesonide-formoterol (SYMBICORT) 160-4.5 MCG/ACT inhaler Inhale 2 puffs into the lungs 2 (two) times daily.   calcium carbonate (OSCAL) 1500 (600 Ca) MG TABS tablet Take 600 mg of elemental calcium by mouth daily.   carvedilol (COREG) 25 MG tablet Take 0.5 tablets (12.5 mg total) by mouth 2 (two) times daily with a meal.   cephALEXin (KEFLEX) 250 MG capsule Take 1 capsule (250 mg total) by mouth 3 (three) times daily.   Cholecalciferol (VITAMIN D) 50 MCG (2000 UT) tablet Take 2,000 Units by mouth daily.   furosemide (LASIX) 20 MG tablet Take 1 tablet (20 mg total) by mouth daily.   latanoprost (XALATAN) 0.005 % ophthalmic solution Place 1 drop into both eyes at bedtime.   levothyroxine (SYNTHROID) 50 MCG tablet TAKE 1 TABLET(50 MCG) BY MOUTH DAILY BEFORE BREAKFAST   losartan (COZAAR) 100 MG tablet Take 1 tablet (100 mg total) by mouth daily.   nystatin-triamcinolone ointment (MYCOLOG) Apply 1 application.  topically 2 (two) times daily.   OXYGEN Inhale into the lungs. 2 litre AHP   traMADol (ULTRAM) 50 MG tablet Take 0.5-1 tablets (25-50 mg total) by mouth every 6 (six) hours as needed for moderate pain or severe pain.   vitamin B-12 (CYANOCOBALAMIN) 1000 MCG tablet Take 1,000 mcg by mouth daily.   [DISCONTINUED] fluticasone-salmeterol (ADVAIR DISKUS) 250-50 MCG/ACT AEPB INHALE 1 PUFF INTO THE LUNGS TWICE DAILY   No facility-administered encounter medications on file as of 09/21/2022.    Surgical History: Past Surgical History:  Procedure Laterality Date   ABDOMINAL HYSTERECTOMY     APPENDECTOMY     CHOLECYSTECTOMY N/A 03/12/2020   Procedure: LAPAROSCOPIC CHOLECYSTECTOMY;  Surgeon: Fredirick Maudlin, MD;  Location: [facility] ORS;  Service: General;  Laterality: N/A;   COLONOSCOPY WITH PROPOFOL N/A 07/09/2015   Procedure: COLONOSCOPY WITH PROPOFOL;  Surgeon: Manya Silvas, MD;  Location: [facility] ENDOSCOPY;  Service: Endoscopy;  Laterality: N/A;    Medical History: Past Medical History:  Diagnosis Date   Arthritis    Asthma    Cancer ([place])    skin, colon polyp   Chronic kidney disease    Complication of anesthesia    asthma attack per pt   COPD (chronic obstructive pulmonary disease) ([place])    Dyspnea    Hypertension    Hypothyroidism     Family History: Family History  Problem Relation Age of Onset  Heart attack Father    Early death Sister        MVA   Stomach cancer Brother    Cancer Brother        shoulder   Lung cancer Brother    Cancer Sister    Cancer Sister    Early death Sister        husband killed her   Breast cancer Neg Hx     Social History   Socioeconomic History   Marital status: Widowed    Spouse name: Not on file   Number of children: Not on file   Years of education: Not on file   Highest education level: Not on file  Occupational History   Not on file  Tobacco Use   Smoking status: Never   Smokeless tobacco: Never   Tobacco comments:    second  hand smoke  Vaping Use   Vaping Use: Never used  Substance and Sexual Activity   Alcohol use: No   Drug use: No   Sexual activity: Not on file  Other Topics Concern   Not on file  Social History Narrative   Not on file   Social Determinants of Health   Financial Resource Strain: Low Risk  (12/15/2020)   Overall Financial Resource Strain (CARDIA)    Difficulty of Paying Living Expenses: Not very hard  Food Insecurity: Not on file  Transportation Needs: Not on file  Physical Activity: Not on file  Stress: Not on file  Social Connections: Not on file  Intimate Partner Violence: Not on file      Review of Systems  Constitutional:  Negative for chills, fatigue and unexpected weight change.  HENT:  Negative for congestion, postnasal drip, rhinorrhea, sneezing and sore throat.   Respiratory:  Positive for shortness of breath (intermittent). Negative for cough, chest tightness and wheezing.   Cardiovascular:  Negative for chest pain and palpitations.  Gastrointestinal:  Negative for abdominal pain, constipation, diarrhea, nausea and vomiting.  Genitourinary:  Negative for dysuria and frequency.  Skin:  Negative for rash.  Neurological: Negative.  Negative for tremors and numbness.  Psychiatric/Behavioral:  Behavioral problem: Depression.     Vital Signs: BP (!) 160/70 Comment: 196/83  Pulse 77   Temp 97.7 F (36.5 C)   Resp 16   Ht 4\' 8"  (1.422 m)   Wt 123 lb (55.8 kg)   BMI 27.58 kg/m    Physical Exam Vitals reviewed.  Constitutional:      General: She is not in acute distress.    Appearance: Normal appearance. She is well-developed and normal weight. She is not diaphoretic.  HENT:     Head: Normocephalic and atraumatic.  Eyes:     Pupils: Pupils are equal, round, and reactive to light.  Neck:     Thyroid: No thyromegaly.     Vascular: No JVD.     Trachea: No tracheal deviation.  Cardiovascular:     Rate and Rhythm: Normal rate and regular rhythm.     Heart  sounds: Normal heart sounds. No murmur heard.    No friction rub. No gallop.  Pulmonary:     Effort: Pulmonary effort is normal. No respiratory distress.     Breath sounds: Normal breath sounds. No wheezing or rales.  Chest:     Chest wall: No tenderness.  Lymphadenopathy:     Cervical: No cervical adenopathy.  Neurological:     Mental Status: She is alert and oriented to person, place, and time.  Psychiatric:        Mood and Affect: Mood normal.        Behavior: Behavior normal.        Assessment/Plan: 1. COPD with hypoxia ([place]) Home oxygen recertification. On 2 LPM home oxygen - For home use only DME oxygen  2. Shortness of breath 6 min walk done, dropped to 88% on room air, recovered to 96% on 2 LPM - 6 minute walk; Future - For home use only DME oxygen   General Counseling: Dymon verbalizes understanding of the findings of todays visit and agrees with plan of treatment. I have discussed any further diagnostic evaluation that Fariss be needed or ordered today. We also reviewed her medications today. she has been encouraged to call the office with any questions or concerns that should arise related to todays visit.    Orders Placed This Encounter  Procedures   For home use only DME oxygen   6 minute walk    No orders of the defined types were placed in this encounter.   Return in about 6 months (around 03/24/2023) for pulmonary/sleep, F/U.   Total time spent:20 Minutes Time spent includes review of chart, medications, test results, and follow up plan with the patient.   [place] Controlled Substance Database was reviewed by me.  This patient was seen by Jonetta Osgood, FNP-C in collaboration with Dr. Clayborn Bigness as a part of collaborative care agreement.   [place]sor Goeken R. Valetta Fuller, MSN, FNP-C Internal medicine

## 2022-09-22 ENCOUNTER — Telehealth: Payer: Self-pay

## 2022-09-22 NOTE — Telephone Encounter (Signed)
Send message to [employer] ) for Oxygen re certification

## 2022-09-24 DIAGNOSIS — J449 Chronic obstructive pulmonary disease, unspecified: Secondary | ICD-10-CM | POA: Diagnosis not present

## 2022-10-11 DIAGNOSIS — J449 Chronic obstructive pulmonary disease, unspecified: Secondary | ICD-10-CM | POA: Diagnosis not present

## 2022-10-25 DIAGNOSIS — J449 Chronic obstructive pulmonary disease, unspecified: Secondary | ICD-10-CM | POA: Diagnosis not present

## 2022-11-09 ENCOUNTER — Other Ambulatory Visit: Payer: Self-pay | Admitting: Internal Medicine

## 2022-11-09 DIAGNOSIS — E782 Mixed hyperlipidemia: Secondary | ICD-10-CM

## 2022-11-10 DIAGNOSIS — J449 Chronic obstructive pulmonary disease, unspecified: Secondary | ICD-10-CM | POA: Diagnosis not present

## 2022-11-22 ENCOUNTER — Telehealth: Payer: Self-pay | Admitting: Internal Medicine

## 2022-11-22 NOTE — Telephone Encounter (Signed)
Order for full face mask faxed to [facility]; [phone]. Notified Beth & Sarah-Toni

## 2022-11-23 ENCOUNTER — Other Ambulatory Visit: Payer: Self-pay | Admitting: Physician Assistant

## 2022-11-24 DIAGNOSIS — J449 Chronic obstructive pulmonary disease, unspecified: Secondary | ICD-10-CM | POA: Diagnosis not present

## 2022-11-29 DIAGNOSIS — G4733 Obstructive sleep apnea (adult) (pediatric): Secondary | ICD-10-CM | POA: Diagnosis not present

## 2022-12-11 DIAGNOSIS — J449 Chronic obstructive pulmonary disease, unspecified: Secondary | ICD-10-CM | POA: Diagnosis not present

## 2022-12-25 ENCOUNTER — Encounter: Payer: Self-pay | Admitting: Cardiology

## 2022-12-25 ENCOUNTER — Ambulatory Visit: Payer: PPO | Attending: Cardiology | Admitting: Cardiology

## 2022-12-25 VITALS — BP 122/60 | HR 74 | Ht <= 58 in | Wt 118.8 lb

## 2022-12-25 DIAGNOSIS — I5189 Other ill-defined heart diseases: Secondary | ICD-10-CM | POA: Diagnosis not present

## 2022-12-25 DIAGNOSIS — I10 Essential (primary) hypertension: Secondary | ICD-10-CM

## 2022-12-25 DIAGNOSIS — I350 Nonrheumatic aortic (valve) stenosis: Secondary | ICD-10-CM

## 2022-12-25 DIAGNOSIS — R233 Spontaneous ecchymoses: Secondary | ICD-10-CM | POA: Diagnosis not present

## 2022-12-25 DIAGNOSIS — J449 Chronic obstructive pulmonary disease, unspecified: Secondary | ICD-10-CM | POA: Diagnosis not present

## 2022-12-25 NOTE — Patient Instructions (Signed)
Medication Instructions:   STOP Amlodipine  STOP Aspirin  *If you need a refill on your cardiac medications before your next appointment, please call your pharmacy*   Lab Work:  None Ordered  If you have labs (blood work) drawn today and your tests are completely normal, you will receive your results only by: MyChart Message (if you have MyChart) OR A paper copy in the mail If you have any lab test that is abnormal or we need to change your treatment, we will call you to review the results.   Testing/Procedures:  None Ordered    Follow-Up: At [facility], you and your health needs are our priority.  As part of our continuing mission to provide you with exceptional heart care, we have created designated Provider Care Teams.  These Care Teams include your primary Cardiologist (physician) and Advanced Practice Providers (APPs -  Physician Assistants and Nurse Practitioners) who all work together to provide you with the care you need, when you need it.  We recommend signing up for the patient portal called "MyChart".  Sign up information is provided on this After Visit Summary.  MyChart is used to connect with patients for Virtual Visits (Telemedicine).  Patients are able to view lab/test results, encounter notes, upcoming appointments, etc.  Non-urgent messages can be sent to your provider as well.   To learn more about what you can do with MyChart, go to [email or web address].    Your next appointment:   12 month(s)  Provider:   You Matich see Debbe Odea, MD or one of the following Advanced Practice Providers on your designated Care Team:   Nicolasa Ducking, NP Eula Listen, PA-C Cadence Fransico Michael, PA-C Charlsie Quest, NP

## 2022-12-25 NOTE — Progress Notes (Signed)
Cardiology Office Note:    Date:  12/25/2022   ID:  Brenda Tucker, DOB 06/19/1934, MRN [id number]  PCP:  Carlean Jews, PA-C  [facility] Cardiologist:  Debbe Odea, MD  [facility] Electrophysiologist:  None   Referring MD: Carlean Jews, PA*   Chief Complaint  Patient presents with   Follow-up    Patient is concerned with abnormal bruising on legs for about 2 weeks.     History of Present Illness:    Brenda Tucker is a 89 y.o. 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   States having occasional dizziness for a few seconds upon standing.  Denies chest pain, endorses shortness of breath which is chronic.  She uses oxygen when she ambulates, compliant with CPAP at night.  Also has easy bruisability, very frail skin, bleeds easily with minimal contact.   Prior notes Echo 06/16/2020 normal systolic function, grade 2 diastolic dysfunction, EF 55 to 60% Lexiscan Myoview 07/2020, no evidence for ischemia, coronary artery calcifications in the LAD, RCA. Outside echocardiogram on 01/2018 showed normal systolic function, EF 55%, aortic valve calcification. Stop Lipitor in the past due to diarrhea. Episode of A. fib in the context of Lexiscan usage.  Follow-up cardiac monitor with no recurrence of A. fib.  Past Medical History:  Diagnosis Date   Arthritis    Asthma    Cancer (HCC)    skin, colon polyp   Chronic kidney disease    Complication of anesthesia    asthma attack per pt   COPD (chronic obstructive pulmonary disease) (HCC)    Dyspnea    Hypertension    Hypothyroidism     Past Surgical History:  Procedure Laterality Date   ABDOMINAL HYSTERECTOMY     APPENDECTOMY     CHOLECYSTECTOMY N/A 03/12/2020   Procedure: LAPAROSCOPIC CHOLECYSTECTOMY;  Surgeon: Duanne Guess, MD;  Location: [facility] ORS;  Service: General;  Laterality: N/A;    COLONOSCOPY WITH PROPOFOL N/A 07/09/2015   Procedure: COLONOSCOPY WITH PROPOFOL;  Surgeon: Scot Jun, MD;  Location: [facility] ENDOSCOPY;  Service: Endoscopy;  Laterality: N/A;    Current Medications: Current Meds  Medication Sig   acetaminophen (TYLENOL) 500 MG tablet Take 500 mg by mouth every 6 (six) hours as needed for moderate pain or headache.   albuterol (VENTOLIN HFA) 108 (90 Base) MCG/ACT inhaler Inhale 2 puffs into the lungs every 6 (six) hours as needed for wheezing or shortness of breath.   atorvastatin (LIPITOR) 10 MG tablet TAKE 1 TABLET BY MOUTH EVERY OTHER DAY   budesonide-formoterol (SYMBICORT) 160-4.5 MCG/ACT inhaler Inhale 2 puffs into the lungs 2 (two) times daily.   calcium carbonate (OSCAL) 1500 (600 Ca) MG TABS tablet Take 600 mg of elemental calcium by mouth daily.   carvedilol (COREG) 25 MG tablet Take 0.5 tablets (12.5 mg total) by mouth 2 (two) times daily with a meal.   Cholecalciferol (VITAMIN D) 50 MCG (2000 UT) tablet Take 2,000 Units by mouth daily.   furosemide (LASIX) 20 MG tablet TAKE 1 TABLET(20 MG) BY MOUTH DAILY   latanoprost (XALATAN) 0.005 % ophthalmic solution Place 1 drop into both eyes at bedtime.   levothyroxine (SYNTHROID) 50 MCG tablet TAKE 1 TABLET(50 MCG) BY MOUTH DAILY BEFORE BREAKFAST   losartan (COZAAR) 100 MG tablet Take 1 tablet (100 mg total) by mouth daily.   Magnesium Oxide 400 MG CAPS  Take 400 mg by mouth daily.   OXYGEN Inhale into the lungs. 2 litre AHP   traMADol (ULTRAM) 50 MG tablet Take 0.5-1 tablets (25-50 mg total) by mouth every 6 (six) hours as needed for moderate pain or severe pain.   vitamin B-12 (CYANOCOBALAMIN) 1000 MCG tablet Take 1,000 mcg by mouth daily.   [DISCONTINUED] amLODipine (NORVASC) 2.5 MG tablet Take 2.5 mg by mouth daily.   [DISCONTINUED] aspirin EC 81 MG tablet Take 81 mg by mouth daily.     Allergies:   Theodrenaline   Social History   Socioeconomic History   Marital status: Widowed    Spouse name:  Not on file   Number of children: Not on file   Years of education: Not on file   Highest education level: Not on file  Occupational History   Not on file  Tobacco Use   Smoking status: Never   Smokeless tobacco: Never   Tobacco comments:    second hand smoke  Vaping Use   Vaping Use: Never used  Substance and Sexual Activity   Alcohol use: No   Drug use: No   Sexual activity: Not on file  Other Topics Concern   Not on file  Social History Narrative   Not on file   Social Determinants of Health   Financial Resource Strain: Low Risk  (12/15/2020)   Overall Financial Resource Strain (CARDIA)    Difficulty of Paying Living Expenses: Not very hard  Food Insecurity: Not on file  Transportation Needs: Not on file  Physical Activity: Not on file  Stress: Not on file  Social Connections: Not on file     Family History: The patient's family history includes Cancer in her brother, sister, and sister; Early death in her sister and sister; Heart attack in her father; Lung cancer in her brother; Stomach cancer in her brother. There is no history of Breast cancer.  ROS:   Please see the history of present illness.     All other systems reviewed and are negative.  EKGs/Labs/Other Studies Reviewed:    The following studies were reviewed today:   EKG:  EKG is ordered today.  EKG shows sinus rhythm with PVCs.  Right bundle branch block.  Recent Labs: No results found for requested labs within last 365 days.  Recent Lipid Panel    Component Value Date/Time   CHOL 107 07/16/2020 1148   CHOL 212 (H) 12/31/2019 0853   TRIG 89 07/16/2020 1148   HDL 56 07/16/2020 1148   HDL 60 12/31/2019 0853   CHOLHDL 1.9 07/16/2020 1148   VLDL 18 07/16/2020 1148   LDLCALC 33 07/16/2020 1148   LDLCALC 130 (H) 12/31/2019 0853     Risk Assessment/Calculations:      Physical Exam:    VS:  BP 122/60 (BP Location: Left Arm, Patient Position: Sitting, Cuff Size: Normal)   Pulse 74   Ht 4'  8" (1.422 m)   Wt 118 lb 12.8 oz (53.9 kg)   SpO2 93%   BMI 26.63 kg/m     Wt Readings from Last 3 Encounters:  12/25/22 118 lb 12.8 oz (53.9 kg)  09/21/22 123 lb (55.8 kg)  09/04/22 124 lb (56.2 kg)     GEN:  Well nourished, well developed in no acute distress HEENT: Normal NECK: No JVD; No carotid bruits CARDIAC: RRR, 2/6 systolic murmur RESPIRATORY: Diminished breath sounds bilaterally, no wheezing. ABDOMEN: Soft, non-tender, non-distended MUSCULOSKELETAL: no edema; bruises noted on leg. SKIN: Warm and  dry NEUROLOGIC:  Alert and oriented x 3 PSYCHIATRIC:  Normal affect   ASSESSMENT:    1. Primary hypertension   2. Aortic valve stenosis, etiology of cardiac valve disease unspecified   3. Diastolic dysfunction   4. Easy bruisability     PLAN:    In order of problems listed above:  hypertension, BP controlled..  Continue Coreg 12.5 mg twice daily, losartan 100 mg daily.stop Norvasc due to occasional dizziness with standing.  Monitor BP at home.  Given patient's age, blood pressures in the 130s-140s okay. Mild to moderate aortic valve stenosis, on echo 06/20/2022 EF 60 to 65%.  No significant change from prior.  Repeat echo in 2 years from last (2025). grade 2 diastolic dysfunction, appears euvolemic, continue Lasix 20 mg daily.  COPD contributing to shortness of breath. Easy bruisability, stop aspirin.  Follow-up in 12 months.   Shared Decision Making/Informed Consent      Medication Adjustments/Labs and Tests Ordered: Current medicines are reviewed at length with the patient today.  Concerns regarding medicines are outlined above.  Orders Placed This Encounter  Procedures   EKG 12-Lead    No orders of the defined types were placed in this encounter.    Patient Instructions  Medication Instructions:   STOP Amlodipine  STOP Aspirin  *If you need a refill on your cardiac medications before your next appointment, please call your pharmacy*   Lab  Work:  None Ordered  If you have labs (blood work) drawn today and your tests are completely normal, you will receive your results only by: MyChart Message (if you have MyChart) OR A paper copy in the mail If you have any lab test that is abnormal or we need to change your treatment, we will call you to review the results.   Testing/Procedures:  None Ordered    Follow-Up: At [facility], you and your health needs are our priority.  As part of our continuing mission to provide you with exceptional heart care, we have created designated Provider Care Teams.  These Care Teams include your primary Cardiologist (physician) and Advanced Practice Providers (APPs -  Physician Assistants and Nurse Practitioners) who all work together to provide you with the care you need, when you need it.  We recommend signing up for the patient portal called "MyChart".  Sign up information is provided on this After Visit Summary.  MyChart is used to connect with patients for Virtual Visits (Telemedicine).  Patients are able to view lab/test results, encounter notes, upcoming appointments, etc.  Non-urgent messages can be sent to your provider as well.   To learn more about what you can do with MyChart, go to [email or web address].    Your next appointment:   12 month(s)  Provider:   You Mandujano see Debbe Odea, MD or one of the following Advanced Practice Providers on your designated Care Team:   Nicolasa Ducking, NP Eula Listen, PA-C Cadence Fransico Michael, PA-C Charlsie Quest, NP   Signed, Debbe Odea, MD  12/25/2022 11:52 AM    [facility]

## 2023-01-10 DIAGNOSIS — J449 Chronic obstructive pulmonary disease, unspecified: Secondary | ICD-10-CM | POA: Diagnosis not present

## 2023-01-11 ENCOUNTER — Encounter: Payer: Self-pay | Admitting: Physician Assistant

## 2023-01-11 ENCOUNTER — Ambulatory Visit: Payer: PPO | Admitting: Physician Assistant

## 2023-01-11 VITALS — BP 130/82 | HR 63 | Temp 97.8°F | Resp 16 | Ht <= 58 in | Wt 119.0 lb

## 2023-01-11 DIAGNOSIS — J449 Chronic obstructive pulmonary disease, unspecified: Secondary | ICD-10-CM | POA: Diagnosis not present

## 2023-01-11 DIAGNOSIS — Z0001 Encounter for general adult medical examination with abnormal findings: Secondary | ICD-10-CM

## 2023-01-11 DIAGNOSIS — R300 Dysuria: Secondary | ICD-10-CM | POA: Diagnosis not present

## 2023-01-11 DIAGNOSIS — E782 Mixed hyperlipidemia: Secondary | ICD-10-CM | POA: Diagnosis not present

## 2023-01-11 DIAGNOSIS — I10 Essential (primary) hypertension: Secondary | ICD-10-CM

## 2023-01-11 DIAGNOSIS — E039 Hypothyroidism, unspecified: Secondary | ICD-10-CM

## 2023-01-11 DIAGNOSIS — I129 Hypertensive chronic kidney disease with stage 1 through stage 4 chronic kidney disease, or unspecified chronic kidney disease: Secondary | ICD-10-CM

## 2023-01-11 MED ORDER — CARVEDILOL 25 MG PO TABS: 12.5000 mg | ORAL_TABLET | Freq: Two times a day (BID) | ORAL | 1 refills | Status: DC

## 2023-01-11 MED ORDER — LOSARTAN POTASSIUM 100 MG PO TABS
100.0000 mg | ORAL_TABLET | Freq: Every day | ORAL | 3 refills | Status: DC
Start: 2023-01-11 — End: 2023-06-13

## 2023-01-11 NOTE — Progress Notes (Signed)
[facility] [address] [place], [place] [address]  Internal MEDICINE  Office Visit Note  Patient Name: Brenda Tucker  [id number]  [id number]  Date of Service: 01/17/2023  Chief Complaint  Patient presents with   Medicare Wellness   Hypertension     HPI Pt is here for routine health maintenance examination -cardiology took her off of Asprin due to bruising, also taken off amlodipine due to some lightheadedness -Bp stable -Has blood work with kidney provider, however needs lipids and thyroid checked still -Has a film over lens in her eyes and is seeing eye doctor next month -good appetite -wearing cpap and is followed by pulmonology     01/11/2023   10:56 AM 01/05/2022   11:01 AM 12/31/2020   11:26 AM 12/30/2019   11:37 AM 12/30/2019   11:34 AM  MMSE - Mini Mental State Exam  Orientation to time 5 5 5 5 5   Orientation to Place 5 5 5 5 5   Registration 3 3 3 3 3   Attention/ Calculation 5 5 5 5 5   Recall 3 3 3 3 3   Language- name 2 objects 2 2 2 2 2   Language- repeat 1 1 1 1 1   Language- follow 3 step command 3 3 3 3 1   Language- read & follow direction 1 1 1 1 1   Write a sentence 1 1 0 1 1  Copy design 1 1 1 1 1   Total score 30 30 29 30 28       05/08/2022   10:35 AM 08/17/2022    9:40 AM 09/04/2022   11:30 AM 01/11/2023   10:58 AM 01/11/2023   10:59 AM  Fall Risk  Falls in the past year? 0 1 0 0 0  Was there an injury with Fall?  0     Fall Risk Category Calculator  1     Patient at Risk for Falls Due to   No Fall Risks No Fall Risks No Fall Risks  Fall risk Follow up  Falls evaluation completed Falls evaluation completed Falls evaluation completed Falls evaluation completed         Current Medication: Outpatient Encounter Medications as of 01/11/2023  Medication Sig   acetaminophen (TYLENOL) 500 MG tablet Take 500 mg by mouth every 6 (six) hours as needed for moderate pain or headache.   albuterol (VENTOLIN HFA) 108 (90 Base) MCG/ACT inhaler Inhale 2  puffs into the lungs every 6 (six) hours as needed for wheezing or shortness of breath.   atorvastatin (LIPITOR) 10 MG tablet TAKE 1 TABLET BY MOUTH EVERY OTHER DAY   budesonide-formoterol (SYMBICORT) 160-4.5 MCG/ACT inhaler Inhale 2 puffs into the lungs 2 (two) times daily.   calcium carbonate (OSCAL) 1500 (600 Ca) MG TABS tablet Take 600 mg of elemental calcium by mouth daily.   Cholecalciferol (VITAMIN D) 50 MCG (2000 UT) tablet Take 2,000 Units by mouth daily.   furosemide (LASIX) 20 MG tablet TAKE 1 TABLET(20 MG) BY MOUTH DAILY   latanoprost (XALATAN) 0.005 % ophthalmic solution Place 1 drop into both eyes at bedtime.   levothyroxine (SYNTHROID) 50 MCG tablet TAKE 1 TABLET(50 MCG) BY MOUTH DAILY BEFORE BREAKFAST   Magnesium Oxide 400 MG CAPS Take 400 mg by mouth daily.   OXYGEN Inhale into the lungs. 2 litre AHP   traMADol (ULTRAM) 50 MG tablet Take 0.5-1 tablets (25-50 mg total) by mouth every 6 (six) hours as needed for moderate pain or severe pain.   vitamin B-12 (CYANOCOBALAMIN)  1000 MCG tablet Take 1,000 mcg by mouth daily.   [DISCONTINUED] carvedilol (COREG) 25 MG tablet Take 0.5 tablets (12.5 mg total) by mouth 2 (two) times daily with a meal.   [DISCONTINUED] losartan (COZAAR) 100 MG tablet Take 1 tablet (100 mg total) by mouth daily.   carvedilol (COREG) 25 MG tablet Take 0.5 tablets (12.5 mg total) by mouth 2 (two) times daily with a meal.   losartan (COZAAR) 100 MG tablet Take 1 tablet (100 mg total) by mouth daily.   [DISCONTINUED] fluticasone-salmeterol (ADVAIR DISKUS) 250-50 MCG/ACT AEPB INHALE 1 PUFF INTO THE LUNGS TWICE DAILY   No facility-administered encounter medications on file as of 01/11/2023.    Surgical History: Past Surgical History:  Procedure Laterality Date   ABDOMINAL HYSTERECTOMY     APPENDECTOMY     CHOLECYSTECTOMY N/A 03/12/2020   Procedure: LAPAROSCOPIC CHOLECYSTECTOMY;  Surgeon: Duanne Guess, MD;  Location: [facility] ORS;  Service: General;  Laterality:  N/A;   COLONOSCOPY WITH PROPOFOL N/A 07/09/2015   Procedure: COLONOSCOPY WITH PROPOFOL;  Surgeon: Scot Jun, MD;  Location: [facility] ENDOSCOPY;  Service: Endoscopy;  Laterality: N/A;    Medical History: Past Medical History:  Diagnosis Date   Arthritis    Asthma    Cancer (HCC)    skin, colon polyp   Chronic kidney disease    Complication of anesthesia    asthma attack per pt   COPD (chronic obstructive pulmonary disease) (HCC)    Dyspnea    Hypertension    Hypothyroidism     Family History: Family History  Problem Relation Age of Onset   Heart attack Father    Early death Sister        MVA   Stomach cancer Brother    Cancer Brother        shoulder   Lung cancer Brother    Cancer Sister    Cancer Sister    Early death Sister        husband killed her   Breast cancer Neg Hx       Review of Systems  Constitutional:  Negative for chills, fatigue and unexpected weight change.  HENT:  Negative for congestion, postnasal drip, rhinorrhea, sneezing and sore throat.   Respiratory:  Positive for shortness of breath (intermittent). Negative for cough, chest tightness and wheezing.   Cardiovascular:  Negative for chest pain and palpitations.  Gastrointestinal:  Negative for abdominal pain, constipation, diarrhea, nausea and vomiting.  Genitourinary:  Negative for dysuria and frequency.  Skin:  Negative for rash.  Neurological: Negative.  Negative for tremors and numbness.  Psychiatric/Behavioral:  Behavioral problem: Depression. The patient is not nervous/anxious.      Vital Signs: BP 130/82   Pulse 63   Temp 97.8 F (36.6 C)   Resp 16   Ht 4\' 8"  (1.422 m)   Wt 119 lb (54 kg)   SpO2 95%   BMI 26.68 kg/m    Physical Exam Vitals reviewed.  Constitutional:      General: She is not in acute distress.    Appearance: Normal appearance. She is well-developed and normal weight. She is not diaphoretic.  HENT:     Head: Normocephalic and atraumatic.  Eyes:      Pupils: Pupils are equal, round, and reactive to light.  Neck:     Thyroid: No thyromegaly.     Vascular: No JVD.     Trachea: No tracheal deviation.  Cardiovascular:     Rate and Rhythm: Normal rate and  regular rhythm.     Heart sounds: Normal heart sounds. No murmur heard.    No friction rub. No gallop.  Pulmonary:     Effort: Pulmonary effort is normal. No respiratory distress.     Breath sounds: Normal breath sounds. No wheezing or rales.  Chest:     Chest wall: No tenderness.  Abdominal:     Tenderness: There is no abdominal tenderness.  Musculoskeletal:        General: Normal range of motion.  Lymphadenopathy:     Cervical: No cervical adenopathy.  Skin:    General: Skin is warm and dry.  Neurological:     Mental Status: She is alert and oriented to person, place, and time.  Psychiatric:        Mood and Affect: Mood normal.        Behavior: Behavior normal.      LABS: Recent Results (from the past 2160 hour(s))  UA/M w/rflx Culture, Routine     Status: Abnormal   Collection Time: 01/11/23  3:49 PM   Specimen: Urine   Urine  Result Value Ref Range   Specific Gravity, UA 1.006 1.005 - 1.030   pH, UA 6.5 5.0 - 7.5   Color, UA Yellow Yellow   Appearance Ur Clear Clear   Leukocytes,UA 2+ (A) Negative   Protein,UA Negative Negative/Trace   Glucose, UA Negative Negative   Ketones, UA Negative Negative   RBC, UA Negative Negative   Bilirubin, UA Negative Negative   Urobilinogen, Ur 0.2 0.2 - 1.0 mg/dL   Nitrite, UA Negative Negative   Microscopic Examination See below:     Comment: Microscopic was indicated and was performed.   Urinalysis Reflex Comment     Comment: This specimen has reflexed to a Urine Culture.  Microscopic Examination     Status: Abnormal   Collection Time: 01/11/23  3:49 PM   Urine  Result Value Ref Range   WBC, UA >30 (A) 0 - 5 /hpf   RBC, Urine None seen 0 - 2 /hpf   Epithelial Cells (non renal) 0-10 0 - 10 /hpf   Casts None seen None  seen /lpf   Bacteria, UA None seen None seen/Few  Urine Culture, Reflex     Status: None   Collection Time: 01/11/23  3:49 PM   Urine  Result Value Ref Range   Urine Culture, Routine Final report    Organism ID, Bacteria Comment     Comment: Mixed urogenital flora 10,000-25,000 colony forming units per mL   TSH + free T4     Status: None   Collection Time: 01/15/23  9:07 AM  Result Value Ref Range   TSH 1.570 0.450 - 4.500 uIU/mL   Free T4 1.47 0.82 - 1.77 ng/dL  Lipid Panel With LDL/HDL Ratio     Status: None   Collection Time: 01/15/23  9:07 AM  Result Value Ref Range   Cholesterol, Total 145 100 - 199 mg/dL   Triglycerides 161 0 - 149 mg/dL   HDL 68 >09 mg/dL   VLDL Cholesterol Cal 19 5 - 40 mg/dL   LDL Chol Calc (NIH) 58 0 - 99 mg/dL   LDL/HDL Ratio 0.9 0.0 - 3.2 ratio    Comment:                                     LDL/HDL Ratio  Men  Women                               1/2 Avg.Risk  1.0    1.5                                   Avg.Risk  3.6    3.2                                2X Avg.Risk  6.2    5.0                                3X Avg.Risk  8.0    6.1         Assessment/Plan: 1. Encounter for general adult medical examination with abnormal findings CPE performed  2. Essential hypertension Stable, continue current medication - losartan (COZAAR) 100 MG tablet; Take 1 tablet (100 mg total) by mouth daily.  Dispense: 30 tablet; Refill: 3  3. Mixed hyperlipidemia Will update labs - Lipid Panel With LDL/HDL Ratio  4. Hypothyroidism, unspecified type - TSH + free T4  5. COPD with hypoxia (HCC) Followed by pulmonology  6. Hypertensive chronic kidney disease, unspecified CKD stage Followed by nephrology  7. Dysuria - UA/M w/rflx Culture, Routine   General Counseling: Annlouise verbalizes understanding of the findings of todays visit and agrees with plan of treatment. I have discussed any further diagnostic  evaluation that Reddish be needed or ordered today. We also reviewed her medications today. she has been encouraged to call the office with any questions or concerns that should arise related to todays visit.    Counseling:    Orders Placed This Encounter  Procedures   Microscopic Examination   Urine Culture, Reflex   UA/M w/rflx Culture, Routine   TSH + free T4   Lipid Panel With LDL/HDL Ratio    Meds ordered this encounter  Medications   losartan (COZAAR) 100 MG tablet    Sig: Take 1 tablet (100 mg total) by mouth daily.    Dispense:  30 tablet    Refill:  3   carvedilol (COREG) 25 MG tablet    Sig: Take 0.5 tablets (12.5 mg total) by mouth 2 (two) times daily with a meal.    Dispense:  90 tablet    Refill:  1    This patient was seen by Lynn Ito, PA-C in collaboration with Dr. Beverely Risen as a part of collaborative care agreement.  Total time spent:35 Minutes  Time spent includes review of chart, medications, test results, and follow up plan with the patient.     Lyndon Code, MD  Internal Medicine

## 2023-01-12 LAB — UA/M W/RFLX CULTURE, ROUTINE
Ketones, UA: NEGATIVE
Specific Gravity, UA: 1.006 (ref 1.005–1.030)
Urobilinogen, Ur: 0.2 mg/dL (ref 0.2–1.0)

## 2023-01-12 LAB — MICROSCOPIC EXAMINATION
Bacteria, UA: NONE SEEN
RBC, Urine: NONE SEEN /hpf (ref 0–2)
WBC, UA: >30 /hpf — AB (ref 0–5)

## 2023-01-14 LAB — UA/M W/RFLX CULTURE, ROUTINE
Bilirubin, UA: NEGATIVE
Glucose, UA: NEGATIVE
Nitrite, UA: NEGATIVE
Protein,UA: NEGATIVE
RBC, UA: NEGATIVE
pH, UA: 6.5 (ref 5.0–7.5)

## 2023-01-14 LAB — URINE CULTURE, REFLEX

## 2023-01-14 LAB — MICROSCOPIC EXAMINATION: Casts: NONE SEEN /LPF

## 2023-01-15 DIAGNOSIS — E039 Hypothyroidism, unspecified: Secondary | ICD-10-CM | POA: Diagnosis not present

## 2023-01-15 DIAGNOSIS — E782 Mixed hyperlipidemia: Secondary | ICD-10-CM | POA: Diagnosis not present

## 2023-01-16 LAB — LIPID PANEL WITH LDL/HDL RATIO
Cholesterol, Total: 145 mg/dL (ref 100–199)
HDL: 68 mg/dL (ref >39)
LDL Chol Calc (NIH): 58 mg/dL (ref 0–99)
LDL/HDL Ratio: 0.9 ratio (ref 0.0–3.2)
Triglycerides: 107 mg/dL (ref 0–149)
VLDL Cholesterol Cal: 19 mg/dL (ref 5–40)

## 2023-01-16 LAB — TSH+FREE T4
Free T4: 1.47 ng/dL (ref 0.82–1.77)
TSH: 1.57 u[IU]/mL (ref 0.450–4.500)

## 2023-01-17 DIAGNOSIS — J449 Chronic obstructive pulmonary disease, unspecified: Secondary | ICD-10-CM | POA: Diagnosis not present

## 2023-01-17 DIAGNOSIS — E559 Vitamin D deficiency, unspecified: Secondary | ICD-10-CM | POA: Diagnosis not present

## 2023-01-17 DIAGNOSIS — J9610 Chronic respiratory failure, unspecified whether with hypoxia or hypercapnia: Secondary | ICD-10-CM | POA: Diagnosis not present

## 2023-01-17 DIAGNOSIS — E261 Secondary hyperaldosteronism: Secondary | ICD-10-CM | POA: Diagnosis not present

## 2023-01-17 DIAGNOSIS — E663 Overweight: Secondary | ICD-10-CM | POA: Diagnosis not present

## 2023-01-17 DIAGNOSIS — D6869 Other thrombophilia: Secondary | ICD-10-CM | POA: Diagnosis not present

## 2023-01-17 DIAGNOSIS — E039 Hypothyroidism, unspecified: Secondary | ICD-10-CM | POA: Diagnosis not present

## 2023-01-17 DIAGNOSIS — I130 Hypertensive heart and chronic kidney disease with heart failure and stage 1 through stage 4 chronic kidney disease, or unspecified chronic kidney disease: Secondary | ICD-10-CM | POA: Diagnosis not present

## 2023-01-17 DIAGNOSIS — I4891 Unspecified atrial fibrillation: Secondary | ICD-10-CM | POA: Diagnosis not present

## 2023-01-17 DIAGNOSIS — E538 Deficiency of other specified B group vitamins: Secondary | ICD-10-CM | POA: Diagnosis not present

## 2023-01-17 DIAGNOSIS — N184 Chronic kidney disease, stage 4 (severe): Secondary | ICD-10-CM | POA: Diagnosis not present

## 2023-01-17 DIAGNOSIS — E785 Hyperlipidemia, unspecified: Secondary | ICD-10-CM | POA: Diagnosis not present

## 2023-01-18 ENCOUNTER — Telehealth: Payer: Self-pay

## 2023-01-18 NOTE — Telephone Encounter (Signed)
-----   Message from Carlean Jews sent at 01/17/2023  1:04 PM EDT ----- Please let her know her labs looked good

## 2023-01-18 NOTE — Telephone Encounter (Signed)
Left message for patient regarding normal labs.

## 2023-01-24 DIAGNOSIS — J449 Chronic obstructive pulmonary disease, unspecified: Secondary | ICD-10-CM | POA: Diagnosis not present

## 2023-02-06 DIAGNOSIS — G4733 Obstructive sleep apnea (adult) (pediatric): Secondary | ICD-10-CM | POA: Diagnosis not present

## 2023-02-10 DIAGNOSIS — J449 Chronic obstructive pulmonary disease, unspecified: Secondary | ICD-10-CM | POA: Diagnosis not present

## 2023-02-12 DIAGNOSIS — I10 Essential (primary) hypertension: Secondary | ICD-10-CM | POA: Diagnosis not present

## 2023-02-12 DIAGNOSIS — I129 Hypertensive chronic kidney disease with stage 1 through stage 4 chronic kidney disease, or unspecified chronic kidney disease: Secondary | ICD-10-CM | POA: Diagnosis not present

## 2023-02-12 DIAGNOSIS — N1832 Chronic kidney disease, stage 3b: Secondary | ICD-10-CM | POA: Diagnosis not present

## 2023-02-15 ENCOUNTER — Ambulatory Visit: Payer: PPO | Admitting: Nurse Practitioner

## 2023-02-15 ENCOUNTER — Encounter: Payer: Self-pay | Admitting: Nurse Practitioner

## 2023-02-15 VITALS — BP 128/78 | HR 61 | Temp 98.2°F | Resp 16 | Ht <= 58 in | Wt 117.4 lb

## 2023-02-15 DIAGNOSIS — Z7189 Other specified counseling: Secondary | ICD-10-CM | POA: Diagnosis not present

## 2023-02-15 DIAGNOSIS — J4489 Other specified chronic obstructive pulmonary disease: Secondary | ICD-10-CM

## 2023-02-15 DIAGNOSIS — G4733 Obstructive sleep apnea (adult) (pediatric): Secondary | ICD-10-CM | POA: Diagnosis not present

## 2023-02-15 MED ORDER — ALBUTEROL SULFATE HFA 108 (90 BASE) MCG/ACT IN AERS: 2.0000 | INHALATION_SPRAY | Freq: Four times a day (QID) | RESPIRATORY_TRACT | 3 refills | Status: DC | PRN

## 2023-02-15 MED ORDER — BUDESONIDE-FORMOTEROL FUMARATE 160-4.5 MCG/ACT IN AERO
2.0000 | INHALATION_SPRAY | Freq: Two times a day (BID) | RESPIRATORY_TRACT | 1 refills | Status: DC
Start: 2023-02-15 — End: 2023-08-16

## 2023-02-15 NOTE — Progress Notes (Signed)
[facility] [address] [place], [place] [address]  Internal MEDICINE  Office Visit Note  Patient Name: Brenda Tucker  [id number]  [id number]  Date of Service: 02/15/2023  Chief Complaint  Patient presents with   Follow-up    HPI Brenda Tucker presents for a follow-up visit for COPD and OSA COPD -- her last PFT was about 1 year ago. FEV1 at 45%, FVC at 63% and the ratio was 71%. Post bronchodilator treatment, there was no improvement but the FEV1 and FVC worsened by 3% and 5%, respectively.  Home oxygen was recertified at her last pulmonary visit. She continues to use 2 LPM via [place] of supplemental oxygen. She consistently uses her symbicort inhaler as prescribed and has an albuterol rescue inhaler available as needed.  OSA on CPAP -- pressure adjusts automatically and she does not like this. Ordered new supplies recently. Sleeps with it every night, at least 4 hours. No CPAP download available.     Current Medication: Outpatient Encounter Medications as of 02/15/2023  Medication Sig   acetaminophen (TYLENOL) 500 MG tablet Take 500 mg by mouth every 6 (six) hours as needed for moderate pain or headache.   atorvastatin (LIPITOR) 10 MG tablet TAKE 1 TABLET BY MOUTH EVERY OTHER DAY   calcium carbonate (OSCAL) 1500 (600 Ca) MG TABS tablet Take 600 mg of elemental calcium by mouth daily.   carvedilol (COREG) 25 MG tablet Take 0.5 tablets (12.5 mg total) by mouth 2 (two) times daily with a meal.   Cholecalciferol (VITAMIN D) 50 MCG (2000 UT) tablet Take 2,000 Units by mouth daily.   furosemide (LASIX) 20 MG tablet TAKE 1 TABLET(20 MG) BY MOUTH DAILY   latanoprost (XALATAN) 0.005 % ophthalmic solution Place 1 drop into both eyes at bedtime.   levothyroxine (SYNTHROID) 50 MCG tablet TAKE 1 TABLET(50 MCG) BY MOUTH DAILY BEFORE BREAKFAST   losartan (COZAAR) 100 MG tablet Take 1 tablet (100 mg total) by mouth daily.   Magnesium Oxide 400 MG CAPS Take 400 mg by mouth daily.   OXYGEN Inhale into  the lungs. 2 litre AHP   traMADol (ULTRAM) 50 MG tablet Take 0.5-1 tablets (25-50 mg total) by mouth every 6 (six) hours as needed for moderate pain or severe pain.   vitamin B-12 (CYANOCOBALAMIN) 1000 MCG tablet Take 1,000 mcg by mouth daily.   [DISCONTINUED] albuterol (VENTOLIN HFA) 108 (90 Base) MCG/ACT inhaler Inhale 2 puffs into the lungs every 6 (six) hours as needed for wheezing or shortness of breath.   [DISCONTINUED] budesonide-formoterol (SYMBICORT) 160-4.5 MCG/ACT inhaler Inhale 2 puffs into the lungs 2 (two) times daily.   albuterol (VENTOLIN HFA) 108 (90 Base) MCG/ACT inhaler Inhale 2 puffs into the lungs every 6 (six) hours as needed for wheezing or shortness of breath.   budesonide-formoterol (SYMBICORT) 160-4.5 MCG/ACT inhaler Inhale 2 puffs into the lungs 2 (two) times daily.   [DISCONTINUED] fluticasone-salmeterol (ADVAIR DISKUS) 250-50 MCG/ACT AEPB INHALE 1 PUFF INTO THE LUNGS TWICE DAILY   No facility-administered encounter medications on file as of 02/15/2023.    Surgical History: Past Surgical History:  Procedure Laterality Date   ABDOMINAL HYSTERECTOMY     APPENDECTOMY     CHOLECYSTECTOMY N/A 03/12/2020   Procedure: LAPAROSCOPIC CHOLECYSTECTOMY;  Surgeon: Duanne Guess, MD;  Location: [facility] ORS;  Service: General;  Laterality: N/A;   COLONOSCOPY WITH PROPOFOL N/A 07/09/2015   Procedure: COLONOSCOPY WITH PROPOFOL;  Surgeon: Scot Jun, MD;  Location: [facility] ENDOSCOPY;  Service: Endoscopy;  Laterality: N/A;  Medical History: Past Medical History:  Diagnosis Date   Arthritis    Asthma    Cancer (HCC)    skin, colon polyp   Chronic kidney disease    Complication of anesthesia    asthma attack per pt   COPD (chronic obstructive pulmonary disease) (HCC)    Dyspnea    Hypertension    Hypothyroidism     Family History: Family History  Problem Relation Age of Onset   Heart attack Father    Early death Sister        MVA   Stomach cancer Brother    Cancer  Brother        shoulder   Lung cancer Brother    Cancer Sister    Cancer Sister    Early death Sister        husband killed her   Breast cancer Neg Hx     Social History   Socioeconomic History   Marital status: Widowed    Spouse name: Not on file   Number of children: Not on file   Years of education: Not on file   Highest education level: Not on file  Occupational History   Not on file  Tobacco Use   Smoking status: Never   Smokeless tobacco: Never   Tobacco comments:    second hand smoke  Vaping Use   Vaping status: Never Used  Substance and Sexual Activity   Alcohol use: No   Drug use: No   Sexual activity: Not on file  Other Topics Concern   Not on file  Social History Narrative   Not on file   Social Determinants of Health   Financial Resource Strain: Low Risk  (12/15/2020)   Overall Financial Resource Strain (CARDIA)    Difficulty of Paying Living Expenses: Not very hard  Food Insecurity: Not on file  Transportation Needs: Not on file  Physical Activity: Not on file  Stress: Not on file  Social Connections: Not on file  Intimate Partner Violence: Not on file      Review of Systems  Constitutional:  Negative for chills, fatigue and unexpected weight change.  HENT:  Negative for congestion, postnasal drip, rhinorrhea, sneezing and sore throat.   Respiratory:  Positive for cough (intermittent) and shortness of breath (intermittent). Negative for chest tightness and wheezing.   Cardiovascular:  Negative for chest pain and palpitations.  Gastrointestinal:  Negative for abdominal pain, constipation, diarrhea, nausea and vomiting.  Genitourinary:  Negative for dysuria and frequency.  Skin:  Negative for rash.  Neurological: Negative.  Negative for tremors and numbness.  Psychiatric/Behavioral:  Behavioral problem: Depression.     Vital Signs: BP 128/78   Pulse 61   Temp 98.2 F (36.8 C)   Resp 16   Ht 4\' 8"  (1.422 m)   Wt 117 lb 6.4 oz (53.3 kg)    SpO2 93%   BMI 26.32 kg/m    Physical Exam Vitals reviewed.  Constitutional:      General: She is not in acute distress.    Appearance: Normal appearance. She is well-developed and normal weight. She is not diaphoretic.  HENT:     Head: Normocephalic and atraumatic.  Eyes:     Pupils: Pupils are equal, round, and reactive to light.  Neck:     Thyroid: No thyromegaly.     Vascular: No JVD.     Trachea: No tracheal deviation.  Cardiovascular:     Rate and Rhythm: Normal rate and regular rhythm.  Heart sounds: Normal heart sounds. No murmur heard.    No friction rub. No gallop.  Pulmonary:     Effort: Pulmonary effort is normal. No respiratory distress.     Breath sounds: Normal breath sounds. No wheezing or rales.  Chest:     Chest wall: No tenderness.  Lymphadenopathy:     Cervical: No cervical adenopathy.  Neurological:     Mental Status: She is alert and oriented to person, place, and time.  Psychiatric:        Mood and Affect: Mood normal.        Behavior: Behavior normal.        Assessment/Plan: 1. Obstructive chronic bronchitis without exacerbation Continue symbicort and albuterol as prescribed, refills ordered.  - albuterol (VENTOLIN HFA) 108 (90 Base) MCG/ACT inhaler; Inhale 2 puffs into the lungs every 6 (six) hours as needed for wheezing or shortness of breath.  Dispense: 18 g; Refill: 3 - budesonide-formoterol (SYMBICORT) 160-4.5 MCG/ACT inhaler; Inhale 2 puffs into the lungs 2 (two) times daily.  Dispense: 10.2 g; Refill: 1  2. OSA on CPAP Stable, using CPAP machine every night, continue CPAP use as instructed   3. CPAP use counseling Does not need any supplies currently. No questions or concerns related to maintenance or use of CPAP machine   General Counseling: Beautiful verbalizes understanding of the findings of todays visit and agrees with plan of treatment. I have discussed any further diagnostic evaluation that Lyn be needed or ordered today. We  also reviewed her medications today. she has been encouraged to call the office with any questions or concerns that should arise related to todays visit.    No orders of the defined types were placed in this encounter.   Meds ordered this encounter  Medications   albuterol (VENTOLIN HFA) 108 (90 Base) MCG/ACT inhaler    Sig: Inhale 2 puffs into the lungs every 6 (six) hours as needed for wheezing or shortness of breath.    Dispense:  18 g    Refill:  3    For future refills   budesonide-formoterol (SYMBICORT) 160-4.5 MCG/ACT inhaler    Sig: Inhale 2 puffs into the lungs 2 (two) times daily.    Dispense:  10.2 g    Refill:  1    For future refills    Return in about 6 months (around 08/18/2023) for F/U, pulmonary/sleep, Shamal Stracener.   Total time spent:30 Minutes Time spent includes review of chart, medications, test results, and follow up plan with the patient.   [place] Controlled Substance Database was reviewed by me.  This patient was seen by Sallyanne Kuster, FNP-C in collaboration with Dr. Beverely Risen as a part of collaborative care agreement.   Agapito Hanway R. Tedd Sias, MSN, FNP-C Internal medicine

## 2023-02-20 DIAGNOSIS — N1832 Chronic kidney disease, stage 3b: Secondary | ICD-10-CM | POA: Diagnosis not present

## 2023-02-20 DIAGNOSIS — R809 Proteinuria, unspecified: Secondary | ICD-10-CM | POA: Diagnosis not present

## 2023-02-20 DIAGNOSIS — N2581 Secondary hyperparathyroidism of renal origin: Secondary | ICD-10-CM | POA: Diagnosis not present

## 2023-02-20 DIAGNOSIS — R319 Hematuria, unspecified: Secondary | ICD-10-CM | POA: Diagnosis not present

## 2023-02-20 DIAGNOSIS — D631 Anemia in chronic kidney disease: Secondary | ICD-10-CM | POA: Diagnosis not present

## 2023-02-20 DIAGNOSIS — I129 Hypertensive chronic kidney disease with stage 1 through stage 4 chronic kidney disease, or unspecified chronic kidney disease: Secondary | ICD-10-CM | POA: Diagnosis not present

## 2023-02-24 DIAGNOSIS — J449 Chronic obstructive pulmonary disease, unspecified: Secondary | ICD-10-CM | POA: Diagnosis not present

## 2023-02-28 DIAGNOSIS — H40003 Preglaucoma, unspecified, bilateral: Secondary | ICD-10-CM | POA: Diagnosis not present

## 2023-03-01 ENCOUNTER — Other Ambulatory Visit: Payer: Self-pay | Admitting: Physician Assistant

## 2023-03-07 DIAGNOSIS — H26491 Other secondary cataract, right eye: Secondary | ICD-10-CM | POA: Diagnosis not present

## 2023-03-07 DIAGNOSIS — H40003 Preglaucoma, unspecified, bilateral: Secondary | ICD-10-CM | POA: Diagnosis not present

## 2023-03-07 DIAGNOSIS — H43813 Vitreous degeneration, bilateral: Secondary | ICD-10-CM | POA: Diagnosis not present

## 2023-03-07 DIAGNOSIS — H353190 Nonexudative age-related macular degeneration, unspecified eye, stage unspecified: Secondary | ICD-10-CM | POA: Diagnosis not present

## 2023-03-10 ENCOUNTER — Encounter: Payer: Self-pay | Admitting: Nurse Practitioner

## 2023-03-13 DIAGNOSIS — J449 Chronic obstructive pulmonary disease, unspecified: Secondary | ICD-10-CM | POA: Diagnosis not present

## 2023-03-27 DIAGNOSIS — J449 Chronic obstructive pulmonary disease, unspecified: Secondary | ICD-10-CM | POA: Diagnosis not present

## 2023-04-02 ENCOUNTER — Other Ambulatory Visit: Payer: Self-pay | Admitting: Nurse Practitioner

## 2023-04-12 DIAGNOSIS — J449 Chronic obstructive pulmonary disease, unspecified: Secondary | ICD-10-CM | POA: Diagnosis not present

## 2023-04-17 DIAGNOSIS — H353190 Nonexudative age-related macular degeneration, unspecified eye, stage unspecified: Secondary | ICD-10-CM | POA: Diagnosis not present

## 2023-04-26 DIAGNOSIS — J449 Chronic obstructive pulmonary disease, unspecified: Secondary | ICD-10-CM | POA: Diagnosis not present

## 2023-05-13 DIAGNOSIS — J449 Chronic obstructive pulmonary disease, unspecified: Secondary | ICD-10-CM | POA: Diagnosis not present

## 2023-05-23 DIAGNOSIS — G4733 Obstructive sleep apnea (adult) (pediatric): Secondary | ICD-10-CM | POA: Diagnosis not present

## 2023-05-27 DIAGNOSIS — J449 Chronic obstructive pulmonary disease, unspecified: Secondary | ICD-10-CM | POA: Diagnosis not present

## 2023-06-07 ENCOUNTER — Other Ambulatory Visit: Payer: Self-pay | Admitting: Internal Medicine

## 2023-06-07 DIAGNOSIS — E782 Mixed hyperlipidemia: Secondary | ICD-10-CM

## 2023-06-12 DIAGNOSIS — J449 Chronic obstructive pulmonary disease, unspecified: Secondary | ICD-10-CM | POA: Diagnosis not present

## 2023-06-13 ENCOUNTER — Telehealth: Payer: Self-pay

## 2023-06-13 DIAGNOSIS — I10 Essential (primary) hypertension: Secondary | ICD-10-CM

## 2023-06-13 MED ORDER — LOSARTAN POTASSIUM 100 MG PO TABS
100.0000 mg | ORAL_TABLET | Freq: Every day | ORAL | 3 refills | Status: DC
Start: 2023-06-13 — End: 2023-06-14

## 2023-06-13 NOTE — Telephone Encounter (Signed)
[facility] Med Adherence Report: Spoke with patient about their Losartan 100mg . Patient is taking prescribed amount as directed. Stressed the importance of medication adherence to the patient. Patient confirmed that they have enough but will need additional refills soon. Sending new RX to [facility] for patient.

## 2023-06-14 ENCOUNTER — Ambulatory Visit
Admission: RE | Admit: 2023-06-14 | Discharge: 2023-06-14 | Disposition: A | Discharge status: Home / Self Care | Payer: PPO | Attending: Physician Assistant | Admitting: Physician Assistant

## 2023-06-14 ENCOUNTER — Telehealth: Payer: Self-pay

## 2023-06-14 ENCOUNTER — Ambulatory Visit: Payer: PPO | Admitting: Physician Assistant

## 2023-06-14 ENCOUNTER — Ambulatory Visit
Admission: RE | Admit: 2023-06-14 | Discharge: 2023-06-14 | Disposition: A | Discharge status: Home / Self Care | Payer: PPO | Source: Ambulatory Visit | Attending: Physician Assistant | Admitting: Physician Assistant

## 2023-06-14 ENCOUNTER — Encounter: Payer: Self-pay | Admitting: Physician Assistant

## 2023-06-14 VITALS — BP 180/80 | HR 67 | Temp 98.3°F | Resp 16 | Ht <= 58 in | Wt 117.4 lb

## 2023-06-14 DIAGNOSIS — R051 Acute cough: Secondary | ICD-10-CM | POA: Insufficient documentation

## 2023-06-14 DIAGNOSIS — G4733 Obstructive sleep apnea (adult) (pediatric): Secondary | ICD-10-CM

## 2023-06-14 DIAGNOSIS — J449 Chronic obstructive pulmonary disease, unspecified: Secondary | ICD-10-CM

## 2023-06-14 DIAGNOSIS — I10 Essential (primary) hypertension: Secondary | ICD-10-CM | POA: Diagnosis not present

## 2023-06-14 DIAGNOSIS — R059 Cough, unspecified: Secondary | ICD-10-CM | POA: Diagnosis not present

## 2023-06-14 DIAGNOSIS — I700 Atherosclerosis of aorta: Secondary | ICD-10-CM | POA: Diagnosis not present

## 2023-06-14 DIAGNOSIS — R079 Chest pain, unspecified: Secondary | ICD-10-CM | POA: Diagnosis not present

## 2023-06-14 MED ORDER — LOSARTAN POTASSIUM 100 MG PO TABS
100.0000 mg | ORAL_TABLET | Freq: Every day | ORAL | 3 refills | Status: DC
Start: 2023-06-14 — End: 2023-11-01

## 2023-06-14 NOTE — Telephone Encounter (Signed)
Spoke with patient regarding chest x-ray results. Tresa Endo will call patient on Monday regarding CPAP issues.

## 2023-06-14 NOTE — Telephone Encounter (Signed)
Spoke kelly about having trouble of cpap she will call pt Monday

## 2023-06-14 NOTE — Progress Notes (Signed)
[facility] [address] [place], [place] [address]  Internal MEDICINE  Office Visit Note  Patient Name: Brenda Tucker  [id number]  [id number]  Date of Service: 06/14/2023  Chief Complaint  Patient presents with   Follow-up   Hypertension   Quality Metric Gaps    Shingles and Pneumonia vaccines    HPI Pt is here for routine follow up -BP at home 120-140 systolic after medication -Very elevated in office and did not improve on recheck. States is was elevated at nephrology appt as well, but prior to this had been very well controlled and she states cardiology stopped her norvasc due to BP dropping. She still has this at home and will restart now and monitor closely. Denies any symptoms, but advised to call of go to ED if not improving or any symptoms arise -problems with cpap recently, states some pain in head and at nose. Does not know how to adjust humidity and will follow up with Tresa Endo on this. States she can wear cpap for an hour before pressure too high. Will check on settings. -Wearing Oxygen continuously  -Also notes some pain in back with coughing recently and while on cpap. Will check chest xray and look into cpap settings. -will see about PNA vaccine  Current Medication: Outpatient Encounter Medications as of 06/14/2023  Medication Sig   acetaminophen (TYLENOL) 500 MG tablet Take 500 mg by mouth every 6 (six) hours as needed for moderate pain or headache.   albuterol (VENTOLIN HFA) 108 (90 Base) MCG/ACT inhaler Inhale 2 puffs into the lungs every 6 (six) hours as needed for wheezing or shortness of breath.   atorvastatin (LIPITOR) 10 MG tablet TAKE 1 TABLET BY MOUTH EVERY OTHER DAY   budesonide-formoterol (SYMBICORT) 160-4.5 MCG/ACT inhaler Inhale 2 puffs into the lungs 2 (two) times daily.   calcium carbonate (OSCAL) 1500 (600 Ca) MG TABS tablet Take 600 mg of elemental calcium by mouth daily.   carvedilol (COREG) 25 MG tablet TAKE 1/2 TABLET(12.5 MG) BY MOUTH  TWICE DAILY WITH A MEAL   Cholecalciferol (VITAMIN D) 50 MCG (2000 UT) tablet Take 2,000 Units by mouth daily.   furosemide (LASIX) 20 MG tablet TAKE 1 TABLET (20MG ) BY MOUTH DAILY   latanoprost (XALATAN) 0.005 % ophthalmic solution Place 1 drop into both eyes at bedtime.   levothyroxine (SYNTHROID) 50 MCG tablet TAKE 1 TABLET(50 MCG) BY MOUTH DAILY BEFORE BREAKFAST   Magnesium Oxide 400 MG CAPS Take 400 mg by mouth daily.   OXYGEN Inhale into the lungs. 2 litre AHP   traMADol (ULTRAM) 50 MG tablet Take 0.5-1 tablets (25-50 mg total) by mouth every 6 (six) hours as needed for moderate pain or severe pain.   vitamin B-12 (CYANOCOBALAMIN) 1000 MCG tablet Take 1,000 mcg by mouth daily.   [DISCONTINUED] losartan (COZAAR) 100 MG tablet Take 1 tablet (100 mg total) by mouth daily.   losartan (COZAAR) 100 MG tablet Take 1 tablet (100 mg total) by mouth daily.   [DISCONTINUED] fluticasone-salmeterol (ADVAIR DISKUS) 250-50 MCG/ACT AEPB INHALE 1 PUFF INTO THE LUNGS TWICE DAILY   No facility-administered encounter medications on file as of 06/14/2023.    Surgical History: Past Surgical History:  Procedure Laterality Date   ABDOMINAL HYSTERECTOMY     APPENDECTOMY     CHOLECYSTECTOMY N/A 03/12/2020   Procedure: LAPAROSCOPIC CHOLECYSTECTOMY;  Surgeon: Duanne Guess, MD;  Location: [facility] ORS;  Service: General;  Laterality: N/A;   COLONOSCOPY WITH PROPOFOL N/A 07/09/2015   Procedure: COLONOSCOPY WITH  PROPOFOL;  Surgeon: Scot Jun, MD;  Location: [facility] ENDOSCOPY;  Service: Endoscopy;  Laterality: N/A;    Medical History: Past Medical History:  Diagnosis Date   Arthritis    Asthma    Cancer (HCC)    skin, colon polyp   Chronic kidney disease    Complication of anesthesia    asthma attack per pt   COPD (chronic obstructive pulmonary disease) (HCC)    Dyspnea    Hypertension    Hypothyroidism     Family History: Family History  Problem Relation Age of Onset   Heart attack Father     Early death Sister        MVA   Stomach cancer Brother    Cancer Brother        shoulder   Lung cancer Brother    Cancer Sister    Cancer Sister    Early death Sister        husband killed her   Breast cancer Neg Hx     Social History   Socioeconomic History   Marital status: Widowed    Spouse name: Not on file   Number of children: Not on file   Years of education: Not on file   Highest education level: Not on file  Occupational History   Not on file  Tobacco Use   Smoking status: Never   Smokeless tobacco: Never   Tobacco comments:    second hand smoke  Vaping Use   Vaping status: Never Used  Substance and Sexual Activity   Alcohol use: No   Drug use: No   Sexual activity: Not on file  Other Topics Concern   Not on file  Social History Narrative   Not on file   Social Drivers of Health   Financial Resource Strain: Low Risk  (12/15/2020)   Overall Financial Resource Strain (CARDIA)    Difficulty of Paying Living Expenses: Not very hard  Food Insecurity: Not on file  Transportation Needs: Not on file  Physical Activity: Not on file  Stress: Not on file  Social Connections: Not on file  Intimate Partner Violence: Not on file      Review of Systems  Constitutional:  Negative for chills, fatigue and unexpected weight change.  HENT:  Negative for congestion, postnasal drip, rhinorrhea, sneezing and sore throat.   Respiratory:  Positive for cough (intermittent) and shortness of breath (intermittent). Negative for chest tightness and wheezing.        Pain with coughing  Cardiovascular:  Negative for chest pain and palpitations.  Gastrointestinal:  Negative for abdominal pain, constipation, diarrhea, nausea and vomiting.  Genitourinary:  Negative for dysuria and frequency.  Skin:  Negative for rash.  Neurological: Negative.  Negative for tremors and numbness.  Psychiatric/Behavioral:  Behavioral problem: Depression.     Vital Signs: BP (!) 180/80   Pulse  67   Temp 98.3 F (36.8 C)   Resp 16   Ht 4\' 8"  (1.422 m)   Wt 117 lb 6.4 oz (53.3 kg)   SpO2 94%   BMI 26.32 kg/m    Physical Exam Vitals reviewed.  Constitutional:      General: She is not in acute distress.    Appearance: Normal appearance. She is well-developed and normal weight. She is not diaphoretic.  HENT:     Head: Normocephalic and atraumatic.  Eyes:     Pupils: Pupils are equal, round, and reactive to light.  Neck:     Thyroid: No  thyromegaly.     Vascular: No JVD.     Trachea: No tracheal deviation.  Cardiovascular:     Rate and Rhythm: Normal rate and regular rhythm.     Heart sounds: Normal heart sounds. No murmur heard.    No friction rub. No gallop.  Pulmonary:     Effort: Pulmonary effort is normal.     Breath sounds: Normal breath sounds. No wheezing.  Lymphadenopathy:     Cervical: No cervical adenopathy.  Neurological:     Mental Status: She is alert and oriented to person, place, and time.  Psychiatric:        Mood and Affect: Mood normal.        Behavior: Behavior normal.        Assessment/Plan: 1. Essential hypertension Very elevated and will add back norvasc 5mg --has at home still and will call if new script needed. Will monitor closely. Call or go to ED if not improving or if symptoms arise - losartan (COZAAR) 100 MG tablet; Take 1 tablet (100 mg total) by mouth daily.  Dispense: 90 tablet; Refill: 3  2. Acute cough (Primary) Pain with coughing recently and some pain with cpap use. Will check chest xray - DG Chest 2 View; Future  3. OSA on CPAP Will follow up on cpap settings as pt feels pressure too high recently and unable to tolerate more than 1 hour  4. COPD with hypoxia (HCC) Continue inhalers and oxygen as before, followed by pulmonology   General Counseling: Lanae verbalizes understanding of the findings of todays visit and agrees with plan of treatment. I have discussed any further diagnostic evaluation that [place] be needed or  ordered today. We also reviewed her medications today. she has been encouraged to call the office with any questions or concerns that should arise related to todays visit.    Orders Placed This Encounter  Procedures   DG Chest 2 View    Meds ordered this encounter  Medications   losartan (COZAAR) 100 MG tablet    Sig: Take 1 tablet (100 mg total) by mouth daily.    Dispense:  90 tablet    Refill:  3    This patient was seen by Lynn Ito, PA-C in collaboration with Dr. Beverely Risen as a part of collaborative care agreement.   Total time spent:35 Minutes Time spent includes review of chart, medications, test results, and follow up plan with the patient.      Dr Lyndon Code Internal medicine

## 2023-06-26 DIAGNOSIS — J449 Chronic obstructive pulmonary disease, unspecified: Secondary | ICD-10-CM | POA: Diagnosis not present

## 2023-06-28 ENCOUNTER — Other Ambulatory Visit: Payer: Self-pay | Admitting: Nurse Practitioner

## 2023-07-06 ENCOUNTER — Ambulatory Visit (INDEPENDENT_AMBULATORY_CARE_PROVIDER_SITE_OTHER): Payer: PPO | Admitting: Physician Assistant

## 2023-07-06 VITALS — BP 148/80 | HR 61 | Temp 97.9°F | Resp 18 | Ht <= 58 in | Wt 117.0 lb

## 2023-07-06 DIAGNOSIS — I10 Essential (primary) hypertension: Secondary | ICD-10-CM

## 2023-07-06 DIAGNOSIS — J449 Chronic obstructive pulmonary disease, unspecified: Secondary | ICD-10-CM

## 2023-07-06 NOTE — Progress Notes (Signed)
 [facility] [address] [place], [place] [address]  Internal MEDICINE  Office Visit Note  Patient Name: Brenda Tucker  [id number]  [id number]  Date of Service: 07/06/2023  Chief Complaint  Patient presents with   Follow-up   Hypertension    HPI Pt is here for routine follow up -Continues to require oxygen , desat to 88 on RA -BP improved from last visit since restarting 2.5mg  amlodipine , but still high. BP did not improve on recheck. Home cuff broke and hasn't been able to check recently. Will need to get new cuff and bring next visit to compare as she does get more anxious in office driving it up further. -will cut back down on salt intake  Current Medication: Outpatient Encounter Medications as of 07/06/2023  Medication Sig   acetaminophen  (TYLENOL ) 500 MG tablet Take 500 mg by mouth every 6 (six) hours as needed for moderate pain or headache.   albuterol  (VENTOLIN  HFA) 108 (90 Base) MCG/ACT inhaler Inhale 2 puffs into the lungs every 6 (six) hours as needed for wheezing or shortness of breath.   amLODipine  (NORVASC ) 2.5 MG tablet Take 2.5 mg by mouth daily.   atorvastatin  (LIPITOR) 10 MG tablet TAKE 1 TABLET BY MOUTH EVERY OTHER DAY   budesonide -formoterol  (SYMBICORT ) 160-4.5 MCG/ACT inhaler Inhale 2 puffs into the lungs 2 (two) times daily.   calcium  carbonate (OSCAL) 1500 (600 Ca) MG TABS tablet Take 600 mg of elemental calcium  by mouth daily.   carvedilol  (COREG ) 25 MG tablet TAKE 1/2 TABLET(12.5 MG) BY MOUTH TWICE DAILY WITH A MEAL   Cholecalciferol  (VITAMIN D ) 50 MCG (2000 UT) tablet Take 2,000 Units by mouth daily.   furosemide  (LASIX ) 20 MG tablet TAKE 1 TABLET (20MG ) BY MOUTH DAILY   levothyroxine  (SYNTHROID ) 50 MCG tablet TAKE 1 TABLET(50 MCG) BY MOUTH DAILY BEFORE BREAKFAST   losartan  (COZAAR ) 100 MG tablet Take 1 tablet (100 mg total) by mouth daily.   Magnesium  Oxide 400 MG CAPS Take 400 mg by mouth daily.   multivitamin-lutein (OCUVITE-LUTEIN) CAPS capsule  Take 1 capsule by mouth daily.   OXYGEN  Inhale into the lungs. 2 litre AHP   ROCKLATAN  0.02-0.005 % SOLN Apply 1 drop to eye at bedtime.   vitamin B-12 (CYANOCOBALAMIN ) 1000 MCG tablet Take 1,000 mcg by mouth daily.   [DISCONTINUED] latanoprost  (XALATAN ) 0.005 % ophthalmic solution Place 1 drop into both eyes at bedtime.   [DISCONTINUED] fluticasone -salmeterol (ADVAIR DISKUS) 250-50 MCG/ACT AEPB INHALE 1 PUFF INTO THE LUNGS TWICE DAILY   [DISCONTINUED] traMADol  (ULTRAM ) 50 MG tablet Take 0.5-1 tablets (25-50 mg total) by mouth every 6 (six) hours as needed for moderate pain or severe pain. (Patient not taking: Reported on 07/06/2023)   No facility-administered encounter medications on file as of 07/06/2023.    Surgical History: Past Surgical History:  Procedure Laterality Date   ABDOMINAL HYSTERECTOMY     APPENDECTOMY     CHOLECYSTECTOMY N/A 03/12/2020   Procedure: LAPAROSCOPIC CHOLECYSTECTOMY;  Surgeon: Marolyn Nest, MD;  Location: [facility] ORS;  Service: General;  Laterality: N/A;   COLONOSCOPY WITH PROPOFOL  N/A 07/09/2015   Procedure: COLONOSCOPY WITH PROPOFOL ;  Surgeon: Lamar ONEIDA Holmes, MD;  Location: [facility] ENDOSCOPY;  Service: Endoscopy;  Laterality: N/A;    Medical History: Past Medical History:  Diagnosis Date   Arthritis    Asthma    Cancer (HCC)    skin, colon polyp   Chronic kidney disease    Complication of anesthesia    asthma attack per pt  COPD (chronic obstructive pulmonary disease) (HCC)    Dyspnea    Hypertension    Hypothyroidism     Family History: Family History  Problem Relation Age of Onset   Heart attack Father    Early death Sister        MVA   Stomach cancer Brother    Cancer Brother        shoulder   Lung cancer Brother    Cancer Sister    Cancer Sister    Early death Sister        husband killed her   Breast cancer Neg Hx     Social History   Socioeconomic History   Marital status: Widowed    Spouse name: Not on file   Number of  children: Not on file   Years of education: Not on file   Highest education level: Not on file  Occupational History   Not on file  Tobacco Use   Smoking status: Never   Smokeless tobacco: Never   Tobacco comments:    second hand smoke  Vaping Use   Vaping status: Never Used  Substance and Sexual Activity   Alcohol use: No   Drug use: No   Sexual activity: Not on file  Other Topics Concern   Not on file  Social History Narrative   Not on file   Social Drivers of Health   Financial Resource Strain: Low Risk  (12/15/2020)   Overall Financial Resource Strain (CARDIA)    Difficulty of Paying Living Expenses: Not very hard  Food Insecurity: Not on file  Transportation Needs: Not on file  Physical Activity: Not on file  Stress: Not on file  Social Connections: Not on file  Intimate Partner Violence: Not on file      Review of Systems  Constitutional:  Negative for chills, fatigue and unexpected weight change.  HENT:  Negative for congestion, postnasal drip, rhinorrhea, sneezing and sore throat.   Respiratory:  Positive for shortness of breath (intermittent). Negative for cough, chest tightness and wheezing.   Cardiovascular:  Negative for chest pain and palpitations.  Gastrointestinal:  Negative for abdominal pain, constipation, diarrhea, nausea and vomiting.  Genitourinary:  Negative for dysuria and frequency.  Skin:  Negative for rash.  Neurological: Negative.  Negative for tremors and numbness.  Psychiatric/Behavioral:  Behavioral problem: Depression. The patient is not nervous/anxious.     Vital Signs: BP (!) 148/80   Pulse 61   Temp 97.9 F (36.6 C)   Resp 18   Ht 4' 8 (1.422 m)   Wt 117 lb (53.1 kg)   SpO2 96%   BMI 26.23 kg/m    Physical Exam Vitals reviewed.  Constitutional:      General: She is not in acute distress.    Appearance: Normal appearance. She is well-developed and normal weight. She is not diaphoretic.  HENT:     Head: Normocephalic  and atraumatic.  Eyes:     Pupils: Pupils are equal, round, and reactive to light.  Neck:     Thyroid : No thyromegaly.     Vascular: No JVD.     Trachea: No tracheal deviation.  Cardiovascular:     Rate and Rhythm: Normal rate and regular rhythm.     Heart sounds: Normal heart sounds. No murmur heard.    No friction rub. No gallop.  Pulmonary:     Effort: Pulmonary effort is normal.     Breath sounds: Normal breath sounds. No wheezing.  Lymphadenopathy:  Cervical: No cervical adenopathy.  Neurological:     Mental Status: She is alert and oriented to person, place, and time.  Psychiatric:        Mood and Affect: Mood normal.        Behavior: Behavior normal.        Assessment/Plan: 1. Essential hypertension (Primary) Elevated in office, but greatly improved. Will get new cuff to monitor at home--if still elevated will double amlodipine  to 5mg  otherwise hold at 2.5mg  and recheck in 1 month. Pt instructed to bring cuff to office next visit to compare readings  2. COPD with hypoxia (HCC) Continue inhalers and oxygen  as before   General Counseling: Amorita verbalizes understanding of the findings of todays visit and agrees with plan of treatment. I have discussed any further diagnostic evaluation that Balderson be needed or ordered today. We also reviewed her medications today. she has been encouraged to call the office with any questions or concerns that should arise related to todays visit.    No orders of the defined types were placed in this encounter.   No orders of the defined types were placed in this encounter.   This patient was seen by Tinnie Pro, PA-C in collaboration with Dr. Sigrid Bathe as a part of collaborative care agreement.   Total time spent:30 Minutes Time spent includes review of chart, medications, test results, and follow up plan with the patient.      Dr Fozia M Khan Internal medicine

## 2023-07-13 DIAGNOSIS — J449 Chronic obstructive pulmonary disease, unspecified: Secondary | ICD-10-CM | POA: Diagnosis not present

## 2023-07-19 ENCOUNTER — Other Ambulatory Visit: Payer: Self-pay | Admitting: Physician Assistant

## 2023-07-19 DIAGNOSIS — E039 Hypothyroidism, unspecified: Secondary | ICD-10-CM

## 2023-07-27 DIAGNOSIS — J449 Chronic obstructive pulmonary disease, unspecified: Secondary | ICD-10-CM | POA: Diagnosis not present

## 2023-08-02 ENCOUNTER — Encounter: Payer: Self-pay | Admitting: Physician Assistant

## 2023-08-02 ENCOUNTER — Ambulatory Visit (INDEPENDENT_AMBULATORY_CARE_PROVIDER_SITE_OTHER): Payer: PPO | Admitting: Physician Assistant

## 2023-08-02 VITALS — BP 169/69 | HR 63 | Temp 97.8°F | Resp 16 | Ht <= 58 in | Wt 116.0 lb

## 2023-08-02 DIAGNOSIS — J449 Chronic obstructive pulmonary disease, unspecified: Secondary | ICD-10-CM | POA: Diagnosis not present

## 2023-08-02 DIAGNOSIS — I10 Essential (primary) hypertension: Secondary | ICD-10-CM | POA: Diagnosis not present

## 2023-08-02 NOTE — Progress Notes (Signed)
[facility] [address] [place], [place] [address]  Internal MEDICINE  Office Visit Note  Patient Name: Brenda Tucker  [id number]  [id number]  Date of Service: 08/02/2023  Chief Complaint  Patient presents with   Follow-up   Hypertension    HPI Pt is here for routine follow up for HTN -BP at home 130s systolic, until the past few days has been 150 systolic due to running out of amlodipine on Tuesday. She is going to pick up refill today -BP in office did not improve on recheck -Bring cuff next visit to compare with manual reading in office to ensure accurate home readings -Gets more agitated not being able to drive herself to appts now and this drives up BP more recently. States decline in vision with glaucoma/macular degeneration. Followed by eye doctor -Does have pulm and nephrology visits next month and will have BP rechecked at both for continued monitoring.  Current Medication: Outpatient Encounter Medications as of 08/02/2023  Medication Sig   acetaminophen (TYLENOL) 500 MG tablet Take 500 mg by mouth every 6 (six) hours as needed for moderate pain or headache.   albuterol (VENTOLIN HFA) 108 (90 Base) MCG/ACT inhaler Inhale 2 puffs into the lungs every 6 (six) hours as needed for wheezing or shortness of breath.   amLODipine (NORVASC) 2.5 MG tablet Take 2.5 mg by mouth daily.   atorvastatin (LIPITOR) 10 MG tablet TAKE 1 TABLET BY MOUTH EVERY OTHER DAY   budesonide-formoterol (SYMBICORT) 160-4.5 MCG/ACT inhaler Inhale 2 puffs into the lungs 2 (two) times daily.   calcium carbonate (OSCAL) 1500 (600 Ca) MG TABS tablet Take 600 mg of elemental calcium by mouth daily.   carvedilol (COREG) 25 MG tablet TAKE 1/2 TABLET(12.5 MG) BY MOUTH TWICE DAILY WITH A MEAL   Cholecalciferol (VITAMIN D) 50 MCG (2000 UT) tablet Take 2,000 Units by mouth daily.   furosemide (LASIX) 20 MG tablet TAKE 1 TABLET (20MG ) BY MOUTH DAILY   levothyroxine (SYNTHROID) 50 MCG tablet TAKE 1 TABLET  (50 MCG) BY MOUTH DAILY BEFORE BREAKFAST   losartan (COZAAR) 100 MG tablet Take 1 tablet (100 mg total) by mouth daily.   Magnesium Oxide 400 MG CAPS Take 400 mg by mouth daily.   multivitamin-lutein (OCUVITE-LUTEIN) CAPS capsule Take 1 capsule by mouth daily.   OXYGEN Inhale into the lungs. 2 litre AHP   ROCKLATAN 0.02-0.005 % SOLN Apply 1 drop to eye at bedtime.   vitamin B-12 (CYANOCOBALAMIN) 1000 MCG tablet Take 1,000 mcg by mouth daily.   [DISCONTINUED] fluticasone-salmeterol (ADVAIR DISKUS) 250-50 MCG/ACT AEPB INHALE 1 PUFF INTO THE LUNGS TWICE DAILY   No facility-administered encounter medications on file as of 08/02/2023.    Surgical History: Past Surgical History:  Procedure Laterality Date   ABDOMINAL HYSTERECTOMY     APPENDECTOMY     CHOLECYSTECTOMY N/A 03/12/2020   Procedure: LAPAROSCOPIC CHOLECYSTECTOMY;  Surgeon: Duanne Guess, MD;  Location: [facility] ORS;  Service: General;  Laterality: N/A;   COLONOSCOPY WITH PROPOFOL N/A 07/09/2015   Procedure: COLONOSCOPY WITH PROPOFOL;  Surgeon: Scot Jun, MD;  Location: [facility] ENDOSCOPY;  Service: Endoscopy;  Laterality: N/A;    Medical History: Past Medical History:  Diagnosis Date   Arthritis    Asthma    Cancer (HCC)    skin, colon polyp   Chronic kidney disease    Complication of anesthesia    asthma attack per pt   COPD (chronic obstructive pulmonary disease) (HCC)    Dyspnea    Hypertension  Hypothyroidism     Family History: Family History  Problem Relation Age of Onset   Heart attack Father    Early death Sister        MVA   Stomach cancer Brother    Cancer Brother        shoulder   Lung cancer Brother    Cancer Sister    Cancer Sister    Early death Sister        husband killed her   Breast cancer Neg Hx     Social History   Socioeconomic History   Marital status: Widowed    Spouse name: Not on file   Number of children: Not on file   Years of education: Not on file   Highest education  level: Not on file  Occupational History   Not on file  Tobacco Use   Smoking status: Never   Smokeless tobacco: Never   Tobacco comments:    second hand smoke  Vaping Use   Vaping status: Never Used  Substance and Sexual Activity   Alcohol use: No   Drug use: No   Sexual activity: Not on file  Other Topics Concern   Not on file  Social History Narrative   Not on file   Social Drivers of Health   Financial Resource Strain: Low Risk  (12/15/2020)   Overall Financial Resource Strain (CARDIA)    Difficulty of Paying Living Expenses: Not very hard  Food Insecurity: Not on file  Transportation Needs: Not on file  Physical Activity: Not on file  Stress: Not on file  Social Connections: Not on file  Intimate Partner Violence: Not on file      Review of Systems  Constitutional:  Negative for chills, fatigue and unexpected weight change.  HENT:  Negative for congestion, postnasal drip, rhinorrhea, sneezing and sore throat.   Respiratory:  Positive for shortness of breath (intermittent). Negative for cough, chest tightness and wheezing.   Cardiovascular:  Negative for chest pain and palpitations.  Gastrointestinal:  Negative for abdominal pain, constipation, diarrhea, nausea and vomiting.  Genitourinary:  Negative for dysuria and frequency.  Skin:  Negative for rash.  Neurological: Negative.  Negative for tremors and numbness.  Psychiatric/Behavioral:  Behavioral problem: Depression. The patient is not nervous/anxious.     Vital Signs: BP (!) 169/69   Pulse 63   Temp 97.8 F (36.6 C)   Resp 16   Ht 4\' 8"  (1.422 m)   Wt 116 lb (52.6 kg)   SpO2 98%   BMI 26.01 kg/m    Physical Exam Vitals reviewed.  Constitutional:      General: She is not in acute distress.    Appearance: Normal appearance. She is well-developed and normal weight. She is not diaphoretic.  HENT:     Head: Normocephalic and atraumatic.  Eyes:     Pupils: Pupils are equal, round, and reactive to  light.  Neck:     Thyroid: No thyromegaly.     Vascular: No JVD.     Trachea: No tracheal deviation.  Cardiovascular:     Rate and Rhythm: Normal rate and regular rhythm.     Heart sounds: Normal heart sounds. No murmur heard.    No friction rub. No gallop.  Pulmonary:     Effort: Pulmonary effort is normal.     Breath sounds: Normal breath sounds. No wheezing.  Lymphadenopathy:     Cervical: No cervical adenopathy.  Neurological:     Mental Status: She  is alert and oriented to person, place, and time.  Psychiatric:        Mood and Affect: Mood normal.        Behavior: Behavior normal.        Assessment/Plan: 1. Essential hypertension (Primary) Improved at home when taking 2.5mg  amlodipine and higher now since running out. Will pick up today to restart on this and continue to monitor. Will also be able to check BP at upcoming pulmonary visit in office. Advised to bring cuff next visit to compare with office readings, as pt gets agitated coming to appts now due to not being able to drive herself and this raises BP in office further. Need to confirm home readings accurate by comparison. Powless need to increase amlodipine in future if rising at home as well.  2. COPD with hypoxia (HCC) Continue oxygen as before, followed by pulmonology   General Counseling: Cyntia verbalizes understanding of the findings of todays visit and agrees with plan of treatment. I have discussed any further diagnostic evaluation that Wilmes be needed or ordered today. We also reviewed her medications today. she has been encouraged to call the office with any questions or concerns that should arise related to todays visit.    No orders of the defined types were placed in this encounter.   No orders of the defined types were placed in this encounter.   This patient was seen by Lynn Ito, PA-C in collaboration with Dr. Beverely Risen as a part of collaborative care agreement.   Total time spent:30  Minutes Time spent includes review of chart, medications, test results, and follow up plan with the patient.      Dr Lyndon Code Internal medicine

## 2023-08-13 DIAGNOSIS — J449 Chronic obstructive pulmonary disease, unspecified: Secondary | ICD-10-CM | POA: Diagnosis not present

## 2023-08-16 ENCOUNTER — Encounter: Payer: Self-pay | Admitting: Nurse Practitioner

## 2023-08-16 ENCOUNTER — Ambulatory Visit: Payer: PPO | Admitting: Nurse Practitioner

## 2023-08-16 VITALS — BP 138/76 | HR 65 | Temp 96.9°F | Resp 16 | Ht <= 58 in | Wt 117.6 lb

## 2023-08-16 DIAGNOSIS — J449 Chronic obstructive pulmonary disease, unspecified: Secondary | ICD-10-CM

## 2023-08-16 DIAGNOSIS — D631 Anemia in chronic kidney disease: Secondary | ICD-10-CM | POA: Diagnosis not present

## 2023-08-16 DIAGNOSIS — Z7189 Other specified counseling: Secondary | ICD-10-CM | POA: Diagnosis not present

## 2023-08-16 DIAGNOSIS — G4733 Obstructive sleep apnea (adult) (pediatric): Secondary | ICD-10-CM | POA: Diagnosis not present

## 2023-08-16 DIAGNOSIS — N1832 Chronic kidney disease, stage 3b: Secondary | ICD-10-CM | POA: Diagnosis not present

## 2023-08-16 DIAGNOSIS — R0602 Shortness of breath: Secondary | ICD-10-CM | POA: Diagnosis not present

## 2023-08-16 DIAGNOSIS — I129 Hypertensive chronic kidney disease with stage 1 through stage 4 chronic kidney disease, or unspecified chronic kidney disease: Secondary | ICD-10-CM | POA: Diagnosis not present

## 2023-08-16 DIAGNOSIS — R319 Hematuria, unspecified: Secondary | ICD-10-CM | POA: Diagnosis not present

## 2023-08-16 DIAGNOSIS — R809 Proteinuria, unspecified: Secondary | ICD-10-CM | POA: Diagnosis not present

## 2023-08-16 DIAGNOSIS — Z9981 Dependence on supplemental oxygen: Secondary | ICD-10-CM

## 2023-08-16 DIAGNOSIS — N2581 Secondary hyperparathyroidism of renal origin: Secondary | ICD-10-CM | POA: Diagnosis not present

## 2023-08-16 MED ORDER — ALBUTEROL SULFATE HFA 108 (90 BASE) MCG/ACT IN AERS: 2.0000 | INHALATION_SPRAY | Freq: Four times a day (QID) | RESPIRATORY_TRACT | 5 refills | Status: DC | PRN

## 2023-08-16 MED ORDER — BUDESONIDE-FORMOTEROL FUMARATE 160-4.5 MCG/ACT IN AERO
2.0000 | INHALATION_SPRAY | Freq: Two times a day (BID) | RESPIRATORY_TRACT | 5 refills | Status: AC
Start: 2023-08-16 — End: ?

## 2023-08-16 NOTE — Progress Notes (Signed)
 [facility] [address] [place], [place] [address]  Internal MEDICINE  Office Visit Note  Patient Name: Brenda Tucker  [id number]  [id number]  Date of Service: 08/16/2023  Chief Complaint  Patient presents with   Follow-up    HPI Brenda Tucker presents for a follow-up visit for OSA and COPD OSA on CPAP -- not tolerating current pressure setting. Cannot get CPAP download. Reports having the CPAP machine for about 2 years now. Using [facility].  COPD with hypoxia -- using symbicort inhaler and prn albuterol inhaler, due for refills. Using supplemental continuous oxygen 2 LPM via [place].    Current Medication: Outpatient Encounter Medications as of 08/16/2023  Medication Sig   acetaminophen (TYLENOL) 500 MG tablet Take 500 mg by mouth every 6 (six) hours as needed for moderate pain or headache.   amLODipine (NORVASC) 2.5 MG tablet Take 2.5 mg by mouth daily.   atorvastatin (LIPITOR) 10 MG tablet TAKE 1 TABLET BY MOUTH EVERY OTHER DAY   calcium carbonate (OSCAL) 1500 (600 Ca) MG TABS tablet Take 600 mg of elemental calcium by mouth daily.   carvedilol (COREG) 25 MG tablet TAKE 1/2 TABLET(12.5 MG) BY MOUTH TWICE DAILY WITH A MEAL   Cholecalciferol (VITAMIN D) 50 MCG (2000 UT) tablet Take 2,000 Units by mouth daily.   furosemide (LASIX) 20 MG tablet TAKE 1 TABLET (20MG ) BY MOUTH DAILY   levothyroxine (SYNTHROID) 50 MCG tablet TAKE 1 TABLET (50 MCG) BY MOUTH DAILY BEFORE BREAKFAST   losartan (COZAAR) 100 MG tablet Take 1 tablet (100 mg total) by mouth daily.   Magnesium Oxide 400 MG CAPS Take 400 mg by mouth daily.   multivitamin-lutein (OCUVITE-LUTEIN) CAPS capsule Take 1 capsule by mouth daily.   OXYGEN Inhale into the lungs. 2 litre AHP   ROCKLATAN 0.02-0.005 % SOLN Apply 1 drop to eye at bedtime.   vitamin B-12 (CYANOCOBALAMIN) 1000 MCG tablet Take 1,000 mcg by mouth daily.   [DISCONTINUED] albuterol (VENTOLIN HFA) 108 (90 Base) MCG/ACT inhaler Inhale 2 puffs into the lungs  every 6 (six) hours as needed for wheezing or shortness of breath.   [DISCONTINUED] budesonide-formoterol (SYMBICORT) 160-4.5 MCG/ACT inhaler Inhale 2 puffs into the lungs 2 (two) times daily.   albuterol (VENTOLIN HFA) 108 (90 Base) MCG/ACT inhaler Inhale 2 puffs into the lungs every 6 (six) hours as needed for wheezing or shortness of breath.   budesonide-formoterol (SYMBICORT) 160-4.5 MCG/ACT inhaler Inhale 2 puffs into the lungs 2 (two) times daily.   [DISCONTINUED] fluticasone-salmeterol (ADVAIR DISKUS) 250-50 MCG/ACT AEPB INHALE 1 PUFF INTO THE LUNGS TWICE DAILY   No facility-administered encounter medications on file as of 08/16/2023.    Surgical History: Past Surgical History:  Procedure Laterality Date   ABDOMINAL HYSTERECTOMY     APPENDECTOMY     CHOLECYSTECTOMY N/A 03/12/2020   Procedure: LAPAROSCOPIC CHOLECYSTECTOMY;  Surgeon: Duanne Guess, MD;  Location: [facility] ORS;  Service: General;  Laterality: N/A;   COLONOSCOPY WITH PROPOFOL N/A 07/09/2015   Procedure: COLONOSCOPY WITH PROPOFOL;  Surgeon: Scot Jun, MD;  Location: [facility] ENDOSCOPY;  Service: Endoscopy;  Laterality: N/A;    Medical History: Past Medical History:  Diagnosis Date   Arthritis    Asthma    Cancer (HCC)    skin, colon polyp   Chronic kidney disease    Complication of anesthesia    asthma attack per pt   COPD (chronic obstructive pulmonary disease) (HCC)    Dyspnea    Hypertension    Hypothyroidism  Family History: Family History  Problem Relation Age of Onset   Heart attack Father    Early death Sister        MVA   Stomach cancer Brother    Cancer Brother        shoulder   Lung cancer Brother    Cancer Sister    Cancer Sister    Early death Sister        husband killed her   Breast cancer Neg Hx     Social History   Socioeconomic History   Marital status: Widowed    Spouse name: Not on file   Number of children: Not on file   Years of education: Not on file   Highest  education level: Not on file  Occupational History   Not on file  Tobacco Use   Smoking status: Never   Smokeless tobacco: Never   Tobacco comments:    second hand smoke  Vaping Use   Vaping status: Never Used  Substance and Sexual Activity   Alcohol use: No   Drug use: No   Sexual activity: Not on file  Other Topics Concern   Not on file  Social History Narrative   Not on file   Social Drivers of Health   Financial Resource Strain: Low Risk  (12/15/2020)   Overall Financial Resource Strain (CARDIA)    Difficulty of Paying Living Expenses: Not very hard  Food Insecurity: Not on file  Transportation Needs: Not on file  Physical Activity: Not on file  Stress: Not on file  Social Connections: Not on file  Intimate Partner Violence: Not on file      Review of Systems  Constitutional:  Negative for chills, fatigue and unexpected weight change.  HENT:  Negative for congestion, postnasal drip, rhinorrhea, sneezing and sore throat.   Respiratory:  Positive for cough (intermittent) and shortness of breath (intermittent). Negative for apnea, chest tightness and wheezing.   Cardiovascular: Negative.  Negative for chest pain and palpitations.  Gastrointestinal:  Negative for abdominal pain, constipation, diarrhea, nausea and vomiting.  Genitourinary:  Negative for dysuria and frequency.  Skin:  Negative for rash.  Neurological: Negative.  Negative for tremors and numbness.  Psychiatric/Behavioral:  Behavioral problem: Depression.     Vital Signs: BP 138/76   Pulse 65   Temp (!) 96.9 F (36.1 C)   Resp 16   Ht 4\' 8"  (1.422 m)   Wt 117 lb 9.6 oz (53.3 kg)   SpO2 95%   PF (!) 2 L/min   BMI 26.37 kg/m    Physical Exam Vitals reviewed.  Constitutional:      General: She is not in acute distress.    Appearance: Normal appearance. She is well-developed and normal weight. She is not diaphoretic.  HENT:     Head: Normocephalic and atraumatic.  Eyes:     Pupils: Pupils  are equal, round, and reactive to light.  Neck:     Thyroid: No thyromegaly.     Vascular: No JVD.     Trachea: No tracheal deviation.  Cardiovascular:     Rate and Rhythm: Normal rate and regular rhythm.     Heart sounds: Normal heart sounds. No murmur heard.    No friction rub. No gallop.  Pulmonary:     Effort: Pulmonary effort is normal. No accessory muscle usage or respiratory distress.     Breath sounds: Normal air entry. Examination of the right-middle field reveals wheezing. Examination of the right-lower field  reveals wheezing. Examination of the left-lower field reveals decreased breath sounds. Decreased breath sounds and wheezing present. No rales.  Chest:     Chest wall: No tenderness.  Lymphadenopathy:     Cervical: No cervical adenopathy.  Neurological:     Mental Status: She is alert and oriented to person, place, and time.  Psychiatric:        Mood and Affect: Mood normal.        Behavior: Behavior normal.        Assessment/Plan: 1. COPD with hypoxia (HCC) (Primary) Spirometry done in office today, due for PFT in 6 months. Continue symbicort and prn albuterol as prescribed.  - Spirometry with Graph - budesonide-formoterol (SYMBICORT) 160-4.5 MCG/ACT inhaler; Inhale 2 puffs into the lungs 2 (two) times daily.  Dispense: 10.2 g; Refill: 5 - albuterol (VENTOLIN HFA) 108 (90 Base) MCG/ACT inhaler; Inhale 2 puffs into the lungs every 6 (six) hours as needed for wheezing or shortness of breath.  Dispense: 18 g; Refill: 5  2. OSA on CPAP Continue CPAP use as instructed   3. CPAP use counseling Continue CPAP use and maintenance as instructed. No supplies needed at this time.   4. Oxygen dependent Continue oxygen use as instructed    General Counseling: Brenda Tucker verbalizes understanding of the findings of todays visit and agrees with plan of treatment. I have discussed any further diagnostic evaluation that Offner be needed or ordered today. We also reviewed her  medications today. she has been encouraged to call the office with any questions or concerns that should arise related to todays visit.    Orders Placed This Encounter  Procedures   Spirometry with Graph    Meds ordered this encounter  Medications   budesonide-formoterol (SYMBICORT) 160-4.5 MCG/ACT inhaler    Sig: Inhale 2 puffs into the lungs 2 (two) times daily.    Dispense:  10.2 g    Refill:  5    For future refills   albuterol (VENTOLIN HFA) 108 (90 Base) MCG/ACT inhaler    Sig: Inhale 2 puffs into the lungs every 6 (six) hours as needed for wheezing or shortness of breath.    Dispense:  18 g    Refill:  5    For future refills    Return in about 6 months (around 02/13/2024) for F/U, pulmonary/sleep, Fadia Marlar or DSK. .   Total time spent:30 Minutes Time spent includes review of chart, medications, test results, and follow up plan with the patient.   [place] Controlled Substance Database was reviewed by me.  This patient was seen by Sallyanne Kuster, FNP-C in collaboration with Dr. Beverely Risen as a part of collaborative care agreement.   Yaretsi Humphres R. Tedd Sias, MSN, FNP-C Internal medicine

## 2023-08-27 DIAGNOSIS — J449 Chronic obstructive pulmonary disease, unspecified: Secondary | ICD-10-CM | POA: Diagnosis not present

## 2023-09-01 ENCOUNTER — Other Ambulatory Visit: Payer: Self-pay | Admitting: Physician Assistant

## 2023-09-05 DIAGNOSIS — J449 Chronic obstructive pulmonary disease, unspecified: Secondary | ICD-10-CM | POA: Diagnosis not present

## 2023-09-05 DIAGNOSIS — R809 Proteinuria, unspecified: Secondary | ICD-10-CM | POA: Diagnosis not present

## 2023-09-05 DIAGNOSIS — N2581 Secondary hyperparathyroidism of renal origin: Secondary | ICD-10-CM | POA: Diagnosis not present

## 2023-09-05 DIAGNOSIS — N1832 Chronic kidney disease, stage 3b: Secondary | ICD-10-CM | POA: Diagnosis not present

## 2023-09-05 DIAGNOSIS — I129 Hypertensive chronic kidney disease with stage 1 through stage 4 chronic kidney disease, or unspecified chronic kidney disease: Secondary | ICD-10-CM | POA: Diagnosis not present

## 2023-09-05 DIAGNOSIS — R319 Hematuria, unspecified: Secondary | ICD-10-CM | POA: Diagnosis not present

## 2023-09-05 DIAGNOSIS — D631 Anemia in chronic kidney disease: Secondary | ICD-10-CM | POA: Diagnosis not present

## 2023-09-22 ENCOUNTER — Encounter: Payer: Self-pay | Admitting: Nurse Practitioner

## 2023-09-24 DIAGNOSIS — G4733 Obstructive sleep apnea (adult) (pediatric): Secondary | ICD-10-CM | POA: Diagnosis not present

## 2023-09-26 DIAGNOSIS — I129 Hypertensive chronic kidney disease with stage 1 through stage 4 chronic kidney disease, or unspecified chronic kidney disease: Secondary | ICD-10-CM | POA: Diagnosis not present

## 2023-09-26 DIAGNOSIS — N2581 Secondary hyperparathyroidism of renal origin: Secondary | ICD-10-CM | POA: Diagnosis not present

## 2023-09-26 DIAGNOSIS — D631 Anemia in chronic kidney disease: Secondary | ICD-10-CM | POA: Diagnosis not present

## 2023-09-26 DIAGNOSIS — R319 Hematuria, unspecified: Secondary | ICD-10-CM | POA: Diagnosis not present

## 2023-09-26 DIAGNOSIS — R809 Proteinuria, unspecified: Secondary | ICD-10-CM | POA: Diagnosis not present

## 2023-09-26 DIAGNOSIS — N1832 Chronic kidney disease, stage 3b: Secondary | ICD-10-CM | POA: Diagnosis not present

## 2023-10-03 DIAGNOSIS — J449 Chronic obstructive pulmonary disease, unspecified: Secondary | ICD-10-CM | POA: Diagnosis not present

## 2023-10-04 DIAGNOSIS — D631 Anemia in chronic kidney disease: Secondary | ICD-10-CM | POA: Diagnosis not present

## 2023-10-04 DIAGNOSIS — R809 Proteinuria, unspecified: Secondary | ICD-10-CM | POA: Diagnosis not present

## 2023-10-04 DIAGNOSIS — I129 Hypertensive chronic kidney disease with stage 1 through stage 4 chronic kidney disease, or unspecified chronic kidney disease: Secondary | ICD-10-CM | POA: Diagnosis not present

## 2023-10-04 DIAGNOSIS — E875 Hyperkalemia: Secondary | ICD-10-CM | POA: Diagnosis not present

## 2023-10-04 DIAGNOSIS — N2581 Secondary hyperparathyroidism of renal origin: Secondary | ICD-10-CM | POA: Diagnosis not present

## 2023-10-04 DIAGNOSIS — N1832 Chronic kidney disease, stage 3b: Secondary | ICD-10-CM | POA: Diagnosis not present

## 2023-10-04 DIAGNOSIS — R319 Hematuria, unspecified: Secondary | ICD-10-CM | POA: Diagnosis not present

## 2023-10-06 DIAGNOSIS — J449 Chronic obstructive pulmonary disease, unspecified: Secondary | ICD-10-CM | POA: Diagnosis not present

## 2023-10-09 DIAGNOSIS — H40003 Preglaucoma, unspecified, bilateral: Secondary | ICD-10-CM | POA: Diagnosis not present

## 2023-10-16 DIAGNOSIS — H401121 Primary open-angle glaucoma, left eye, mild stage: Secondary | ICD-10-CM | POA: Diagnosis not present

## 2023-10-16 DIAGNOSIS — H401111 Primary open-angle glaucoma, right eye, mild stage: Secondary | ICD-10-CM | POA: Diagnosis not present

## 2023-10-16 DIAGNOSIS — H04123 Dry eye syndrome of bilateral lacrimal glands: Secondary | ICD-10-CM | POA: Diagnosis not present

## 2023-10-20 ENCOUNTER — Other Ambulatory Visit: Payer: Self-pay | Admitting: Nurse Practitioner

## 2023-11-01 ENCOUNTER — Ambulatory Visit (INDEPENDENT_AMBULATORY_CARE_PROVIDER_SITE_OTHER): Payer: PPO | Admitting: Physician Assistant

## 2023-11-01 ENCOUNTER — Encounter: Payer: Self-pay | Admitting: Physician Assistant

## 2023-11-01 VITALS — BP 153/71 | HR 68 | Temp 98.2°F | Resp 16 | Ht <= 58 in | Wt 117.6 lb

## 2023-11-01 DIAGNOSIS — I10 Essential (primary) hypertension: Secondary | ICD-10-CM | POA: Diagnosis not present

## 2023-11-01 DIAGNOSIS — N1832 Chronic kidney disease, stage 3b: Secondary | ICD-10-CM | POA: Diagnosis not present

## 2023-11-01 DIAGNOSIS — J449 Chronic obstructive pulmonary disease, unspecified: Secondary | ICD-10-CM

## 2023-11-01 MED ORDER — AMLODIPINE BESYLATE 5 MG PO TABS: 5.0000 mg | ORAL_TABLET | Freq: Every day | ORAL | 1 refills | Status: DC

## 2023-11-01 NOTE — Progress Notes (Signed)
 [facility] [address] [place], [place] [address]  Internal MEDICINE  Office Visit Note  Patient Name: Brenda Tucker  [id number]  [id number]  Date of Service: 11/01/2023  Chief Complaint  Patient presents with   Follow-up   Hypertension    HPI Pt is here for routine follow up -no concerns today -nephrology stopped losartan  in March due to high potassium. Has continued on 2.5mg  amlodipine  and 12.5mg  coreg  BID. BP rising some now -she reports it is better at home, 148-150s/50-60s -170/72 on repeat in office, Will increase amlodipine  to 5mg  due to elevated BP since stopping losartan  and have pt monitor closely -due to see cardiology in June for annual and she will check on this -pt is hard of hearing, but declines hearing aids or referral for testing  Current Medication: Outpatient Encounter Medications as of 11/01/2023  Medication Sig   acetaminophen  (TYLENOL ) 500 MG tablet Take 500 mg by mouth every 6 (six) hours as needed for moderate pain or headache.   albuterol  (VENTOLIN  HFA) 108 (90 Base) MCG/ACT inhaler Inhale 2 puffs into the lungs every 6 (six) hours as needed for wheezing or shortness of breath.   amLODipine  (NORVASC ) 5 MG tablet Take 1 tablet (5 mg total) by mouth daily.   atorvastatin  (LIPITOR) 10 MG tablet TAKE 1 TABLET BY MOUTH EVERY OTHER DAY   budesonide -formoterol  (SYMBICORT ) 160-4.5 MCG/ACT inhaler Inhale 2 puffs into the lungs 2 (two) times daily.   calcium  carbonate (OSCAL) 1500 (600 Ca) MG TABS tablet Take 600 mg of elemental calcium  by mouth daily.   carvedilol  (COREG ) 25 MG tablet TAKE 1/2 TABLET (12.5 MG) BY MOUTH TWICE DAILY WITH A MEAL   Cholecalciferol (VITAMIN D ) 50 MCG (2000 UT) tablet Take 2,000 Units by mouth daily.   furosemide  (LASIX ) 20 MG tablet TAKE 1 TABLET (20MG ) BY MOUTH DAILY   levothyroxine  (SYNTHROID ) 50 MCG tablet TAKE 1 TABLET (50 MCG) BY MOUTH DAILY BEFORE BREAKFAST   multivitamin-lutein (OCUVITE-LUTEIN) CAPS capsule Take 1  capsule by mouth daily.   OXYGEN  Inhale into the lungs. 2 litre AHP   ROCKLATAN 0.02-0.005 % SOLN Apply 1 drop to eye at bedtime.   vitamin B-12 (CYANOCOBALAMIN) 1000 MCG tablet Take 1,000 mcg by mouth daily.   [DISCONTINUED] amLODipine  (NORVASC ) 2.5 MG tablet Take 2.5 mg by mouth daily.   [DISCONTINUED] losartan  (COZAAR ) 100 MG tablet Take 1 tablet (100 mg total) by mouth daily.   [DISCONTINUED] fluticasone -salmeterol (ADVAIR DISKUS) 250-50 MCG/ACT AEPB INHALE 1 PUFF INTO THE LUNGS TWICE DAILY   No facility-administered encounter medications on file as of 11/01/2023.    Surgical History: Past Surgical History:  Procedure Laterality Date   ABDOMINAL HYSTERECTOMY     APPENDECTOMY     CHOLECYSTECTOMY N/A 03/12/2020   Procedure: LAPAROSCOPIC CHOLECYSTECTOMY;  Surgeon: Mercy Stall, MD;  Location: [facility] ORS;  Service: General;  Laterality: N/A;   COLONOSCOPY WITH PROPOFOL  N/A 07/09/2015   Procedure: COLONOSCOPY WITH PROPOFOL ;  Surgeon: Cassie Click, MD;  Location: [facility] ENDOSCOPY;  Service: Endoscopy;  Laterality: N/A;    Medical History: Past Medical History:  Diagnosis Date   Arthritis    Asthma    Cancer (HCC)    skin, colon polyp   Chronic kidney disease    Complication of anesthesia    asthma attack per pt   COPD (chronic obstructive pulmonary disease) (HCC)    Dyspnea    Hypertension    Hypothyroidism     Family History: Family History  Problem Relation Age  of Onset   Heart attack Father    Early death Sister        MVA   Stomach cancer Brother    Cancer Brother        shoulder   Lung cancer Brother    Cancer Sister    Cancer Sister    Early death Sister        husband killed her   Breast cancer Neg Hx     Social History   Socioeconomic History   Marital status: Widowed    Spouse name: Not on file   Number of children: Not on file   Years of education: Not on file   Highest education level: Not on file  Occupational History   Not on file  Tobacco  Use   Smoking status: Never   Smokeless tobacco: Never   Tobacco comments:    second hand smoke  Vaping Use   Vaping status: Never Used  Substance and Sexual Activity   Alcohol use: No   Drug use: No   Sexual activity: Not on file  Other Topics Concern   Not on file  Social History Narrative   Not on file   Social Drivers of Health   Financial Resource Strain: Low Risk  (12/15/2020)   Overall Financial Resource Strain (CARDIA)    Difficulty of Paying Living Expenses: Not very hard  Food Insecurity: Not on file  Transportation Needs: Not on file  Physical Activity: Not on file  Stress: Not on file  Social Connections: Not on file  Intimate Partner Violence: Not on file      Review of Systems  Constitutional:  Negative for chills, fatigue and unexpected weight change.  HENT:  Positive for hearing loss. Negative for congestion, postnasal drip, rhinorrhea, sneezing and sore throat.   Respiratory:  Positive for shortness of breath (intermittent). Negative for cough, chest tightness and wheezing.   Cardiovascular:  Negative for chest pain and palpitations.  Gastrointestinal:  Negative for abdominal pain, constipation, diarrhea, nausea and vomiting.  Genitourinary:  Negative for dysuria and frequency.  Skin:  Negative for rash.  Neurological: Negative.  Negative for tremors and numbness.  Psychiatric/Behavioral:  Behavioral problem: Depression. The patient is not nervous/anxious.     Vital Signs: BP (!) 153/71   Pulse 68   Temp 98.2 F (36.8 C)   Resp 16   Ht 4\' 8"  (1.422 m)   Wt 117 lb 9.6 oz (53.3 kg)   SpO2 94%   BMI 26.37 kg/m    Physical Exam Vitals reviewed.  Constitutional:      General: She is not in acute distress.    Appearance: Normal appearance. She is well-developed and normal weight. She is not diaphoretic.  HENT:     Head: Normocephalic and atraumatic.  Eyes:     Pupils: Pupils are equal, round, and reactive to light.  Neck:     Thyroid : No  thyromegaly.     Vascular: No JVD.     Trachea: No tracheal deviation.  Cardiovascular:     Rate and Rhythm: Normal rate and regular rhythm.     Heart sounds: Normal heart sounds. No murmur heard.    No friction rub. No gallop.  Pulmonary:     Effort: Pulmonary effort is normal.     Breath sounds: Normal breath sounds. No wheezing.  Lymphadenopathy:     Cervical: No cervical adenopathy.  Neurological:     Mental Status: She is alert and oriented to person, place,  and time.  Psychiatric:        Mood and Affect: Mood normal.        Behavior: Behavior normal.        Assessment/Plan: 1. Essential hypertension (Primary) Will increase to 5mg  amlodipine  since BP rising with discontinuation of losartan . Will continue to monitor. Will decrease if BP dropping. - amLODipine  (NORVASC ) 5 MG tablet; Take 1 tablet (5 mg total) by mouth daily.  Dispense: 90 tablet; Refill: 1  2. Stage 3b chronic kidney disease (HCC) Followed by nephrology  3. COPD with hypoxia (HCC) On oxygen , followed by pulmonology   General Counseling: Brelynn verbalizes understanding of the findings of todays visit and agrees with plan of treatment. I have discussed any further diagnostic evaluation that Rugg be needed or ordered today. We also reviewed her medications today. she has been encouraged to call the office with any questions or concerns that should arise related to todays visit.    No orders of the defined types were placed in this encounter.   Meds ordered this encounter  Medications   amLODipine  (NORVASC ) 5 MG tablet    Sig: Take 1 tablet (5 mg total) by mouth daily.    Dispense:  90 tablet    Refill:  1    This patient was seen by Taylor Favia, PA-C in collaboration with Dr. Verneta Gone as a part of collaborative care agreement.   Total time spent:30 Minutes Time spent includes review of chart, medications, test results, and follow up plan with the patient.      Dr Fozia M Khan Internal  medicine

## 2023-11-05 DIAGNOSIS — J449 Chronic obstructive pulmonary disease, unspecified: Secondary | ICD-10-CM | POA: Diagnosis not present

## 2023-11-29 ENCOUNTER — Telehealth: Payer: Self-pay

## 2023-11-29 NOTE — Telephone Encounter (Signed)
 Pt called that her ankle swelling due to she is taking amlodipine  5 mg as per lauren advised her that go back to 2.5 mg and  follow in 1 week and bring her Bp machine

## 2023-12-04 DIAGNOSIS — G4733 Obstructive sleep apnea (adult) (pediatric): Secondary | ICD-10-CM | POA: Diagnosis not present

## 2023-12-06 ENCOUNTER — Encounter: Payer: Self-pay | Admitting: Nurse Practitioner

## 2023-12-06 ENCOUNTER — Ambulatory Visit: Admitting: Nurse Practitioner

## 2023-12-06 VITALS — BP 168/76 | HR 65 | Temp 97.3°F | Resp 16 | Ht <= 58 in | Wt 116.8 lb

## 2023-12-06 DIAGNOSIS — I10 Essential (primary) hypertension: Secondary | ICD-10-CM

## 2023-12-06 DIAGNOSIS — N1832 Chronic kidney disease, stage 3b: Secondary | ICD-10-CM | POA: Diagnosis not present

## 2023-12-06 DIAGNOSIS — J449 Chronic obstructive pulmonary disease, unspecified: Secondary | ICD-10-CM | POA: Diagnosis not present

## 2023-12-06 MED ORDER — AMLODIPINE BESY-BENAZEPRIL HCL 2.5-10 MG PO CAPS: 1.0000 | ORAL_CAPSULE | Freq: Every day | ORAL | 1 refills | Status: DC

## 2023-12-06 NOTE — Patient Instructions (Signed)
 Continue carvedilol  and furosemide  as prescribed  STOP amlodipine   START amlodipine -benazepril 2.5-10 mg tablet once daily as prescribed.

## 2023-12-06 NOTE — Progress Notes (Signed)
 [facility] [address] [place], [place] [address]  Internal MEDICINE  Office Visit Note  Patient Name: Brenda Tucker  [id number]  [id number]  Date of Service: 12/06/2023  Chief Complaint  Patient presents with   Follow-up    F/u bp    HPI Ceil presents for a follow-up visit for elevated blood pressure BP is elevated, was taken off of losartan  by kidney doctor for high potassium.  Is also taking furosemide  and carvedilol .     Current Medication: Outpatient Encounter Medications as of 12/06/2023  Medication Sig   acetaminophen  (TYLENOL ) 500 MG tablet Take 500 mg by mouth every 6 (six) hours as needed for moderate pain or headache.   albuterol  (VENTOLIN  HFA) 108 (90 Base) MCG/ACT inhaler Inhale 2 puffs into the lungs every 6 (six) hours as needed for wheezing or shortness of breath.   amlodipine -benazepril  (LOTREL) 2.5-10 MG capsule Take 1 capsule by mouth daily.   atorvastatin  (LIPITOR) 10 MG tablet TAKE 1 TABLET BY MOUTH EVERY OTHER DAY   budesonide -formoterol  (SYMBICORT ) 160-4.5 MCG/ACT inhaler Inhale 2 puffs into the lungs 2 (two) times daily.   calcium  carbonate (OSCAL) 1500 (600 Ca) MG TABS tablet Take 600 mg of elemental calcium  by mouth daily.   carvedilol  (COREG ) 25 MG tablet TAKE 1/2 TABLET (12.5 MG) BY MOUTH TWICE DAILY WITH A MEAL   Cholecalciferol (VITAMIN D ) 50 MCG (2000 UT) tablet Take 2,000 Units by mouth daily.   furosemide  (LASIX ) 20 MG tablet TAKE 1 TABLET (20MG ) BY MOUTH DAILY   levothyroxine  (SYNTHROID ) 50 MCG tablet TAKE 1 TABLET (50 MCG) BY MOUTH DAILY BEFORE BREAKFAST   multivitamin-lutein (OCUVITE-LUTEIN) CAPS capsule Take 1 capsule by mouth daily.   OXYGEN  Inhale into the lungs. 2 litre AHP   ROCKLATAN 0.02-0.005 % SOLN Apply 1 drop to eye at bedtime.   vitamin B-12 (CYANOCOBALAMIN) 1000 MCG tablet Take 1,000 mcg by mouth daily.   [DISCONTINUED] amLODipine  (NORVASC ) 5 MG tablet Take 1 tablet (5 mg total) by mouth daily.   [DISCONTINUED]  fluticasone -salmeterol (ADVAIR DISKUS) 250-50 MCG/ACT AEPB INHALE 1 PUFF INTO THE LUNGS TWICE DAILY   No facility-administered encounter medications on file as of 12/06/2023.    Surgical History: Past Surgical History:  Procedure Laterality Date   ABDOMINAL HYSTERECTOMY     APPENDECTOMY     CHOLECYSTECTOMY N/A 03/12/2020   Procedure: LAPAROSCOPIC CHOLECYSTECTOMY;  Surgeon: Marolyn Nest, MD;  Location: [facility] ORS;  Service: General;  Laterality: N/A;   COLONOSCOPY WITH PROPOFOL  N/A 07/09/2015   Procedure: COLONOSCOPY WITH PROPOFOL ;  Surgeon: Lamar ONEIDA Holmes, MD;  Location: [facility] ENDOSCOPY;  Service: Endoscopy;  Laterality: N/A;    Medical History: Past Medical History:  Diagnosis Date   Arthritis    Asthma    Cancer (HCC)    skin, colon polyp   Chronic kidney disease    Complication of anesthesia    asthma attack per pt   COPD (chronic obstructive pulmonary disease) (HCC)    Dyspnea    Hypertension    Hypothyroidism     Family History: Family History  Problem Relation Age of Onset   Heart attack Father    Early death Sister        MVA   Stomach cancer Brother    Cancer Brother        shoulder   Lung cancer Brother    Cancer Sister    Cancer Sister    Early death Sister        husband killed her  Breast cancer Neg Hx     Social History   Socioeconomic History   Marital status: Widowed    Spouse name: Not on file   Number of children: Not on file   Years of education: Not on file   Highest education level: Not on file  Occupational History   Not on file  Tobacco Use   Smoking status: Never   Smokeless tobacco: Never   Tobacco comments:    second hand smoke  Vaping Use   Vaping status: Never Used  Substance and Sexual Activity   Alcohol use: No   Drug use: No   Sexual activity: Not on file  Other Topics Concern   Not on file  Social History Narrative   Not on file   Social Drivers of Health   Financial Resource Strain: Low Risk  (12/15/2020)    Overall Financial Resource Strain (CARDIA)    Difficulty of Paying Living Expenses: Not very hard  Food Insecurity: Not on file  Transportation Needs: Not on file  Physical Activity: Not on file  Stress: Not on file  Social Connections: Not on file  Intimate Partner Violence: Not on file      Review of Systems  Constitutional:  Negative for chills, fatigue and unexpected weight change.  HENT:  Negative for congestion, postnasal drip, rhinorrhea, sneezing and sore throat.   Respiratory:  Positive for shortness of breath (intermittent). Negative for cough, chest tightness and wheezing.   Cardiovascular:  Negative for chest pain and palpitations.  Gastrointestinal:  Negative for abdominal pain, constipation, diarrhea, nausea and vomiting.  Genitourinary:  Negative for dysuria and frequency.  Skin:  Negative for rash.  Neurological: Negative.  Negative for tremors and numbness.  Psychiatric/Behavioral:  Behavioral problem: Depression. The patient is not nervous/anxious.     Vital Signs: BP (!) 168/76 Comment: 170/75  Pulse 65   Temp (!) 97.3 F (36.3 C)   Resp 16   Ht 4' 8 (1.422 m)   Wt 116 lb 12.8 oz (53 kg)   SpO2 94% Comment: 2L  BMI 26.19 kg/m    Physical Exam Vitals reviewed.  Constitutional:      Appearance: Normal appearance.  HENT:     Head: Normocephalic and atraumatic.  Eyes:     Pupils: Pupils are equal, round, and reactive to light.  Cardiovascular:     Rate and Rhythm: Normal rate and regular rhythm.  Pulmonary:     Effort: Pulmonary effort is normal. No respiratory distress.  Neurological:     Mental Status: She is alert and oriented to person, place, and time.  Psychiatric:        Mood and Affect: Mood normal.        Behavior: Behavior normal.        Assessment/Plan: 1. Essential hypertension (Primary) Try amlodipine -benazepril , discontinue amlodipine . Follow up in a few weeks with Tinnie RIGGERS PCP  2. Stage 3b chronic kidney disease  (HCC) BP medication changed, will repeat labs in the future  3. COPD with hypoxia (HCC) Stable at this time, continue medications as prescribed.    General Counseling: Mycah verbalizes understanding of the findings of todays visit and agrees with plan of treatment. I have discussed any further diagnostic evaluation that Swaim be needed or ordered today. We also reviewed her medications today. she has been encouraged to call the office with any questions or concerns that should arise related to todays visit.    No orders of the defined types were placed in  this encounter.   Meds ordered this encounter  Medications   amlodipine -benazepril  (LOTREL) 2.5-10 MG capsule    Sig: Take 1 capsule by mouth daily.    Dispense:  90 capsule    Refill:  1    Discontinue all previous orders of amlodipine , fill new script today.    Return for previously scheduled appt with lauren in july.   Total time spent:30 Minutes Time spent includes review of chart, medications, test results, and follow up plan with the patient.   [place] Controlled Substance Database was reviewed by me.  This patient was seen by Mardy Maxin, FNP-C in collaboration with Dr. Sigrid Bathe as a part of collaborative care agreement.   Emileo Semel R. Maxin, MSN, FNP-C Internal medicine

## 2023-12-08 NOTE — Progress Notes (Unsigned)
 Cardiology Clinic Note   Date: 12/08/2023 ID: Tomas Fountain Schellenberg, DOB [birth year], MRN [id number]  Primary Cardiologist:  Constancia Delton, MD  Chief Complaint   Anhelica Fowers Hoheisel is a 89 y.o. female who presents to the clinic today for ***  Patient Profile   Tiffney Haughton Gresham is followed by Dr. Junnie Olives for the history outlined below.      Past medical history significant for: Aortic valve stenosis. Echo 06/20/2022: EF 60 to 65%.  No RWMA.  Grade 1 DD.  Normal RV size/function.  Mild to moderate MR.  The aortic valve has an indeterminate number of cusps.  Mild AI.  Moderate AS, mean gradient 14.7 mmHg. RBBB. PAF. 14-day ZIO 07/30/2020: HR 52 to 162 bpm, average 62 bpm.  Predominantly sinus rhythm.  No evidence of A-fib/flutter. Hypertension. Hyperlipidemia. Lipid panel 01/15/2023: LDL 58, HDL 68, TG 107, total 145. Carotid artery stenosis. Carotid ultrasound 07/14/2020: There is <50% stenosis with moderate plaque formation bilaterally. COPD. GERD. CKD stage III.  In summary, patient was first evaluated by Dr. Junnie Olives on 05/21/2020 for lower extremity edema, shortness of breath and RBBB. Echo at that time showed normal LV function, moderate aortic annular calcification, mildly calcified aortic valve leaflets, borderline AS.  Upon follow-up she reported continued exertional dyspnea with associated chest tightness.  She presented for Lexiscan  in January 2022.  During administration of Lexiscan , during the stress portion of her study, she developed new onset A-fib with RVR with ventricular rates in the 130s.  She spontaneously converted to sinus rhythm 26 minutes into recovery.  Primary cardiologist was contacted and recommended deferring anticoagulation given A-fib was noted with the administration of Lexiscan .  Patient underwent 14-day ZIO monitoring for further evaluation which did not demonstrate A-fib or other arrhythmias.  Upon office follow-up patient was asked if she experienced palpitations while  in A-fib and she denied frank palpitations but reported chest tightness and increased dyspnea during episode.  Last echo December 2023 showed moderate AAS, mean gradient 14.7 mmHg.  Patient was last seen in the office by Dr. Junnie Olives on 12/25/2018 for for evaluation of abnormal bruising on legs x 2 weeks.  She was instructed to stop aspirin.     History of Present Illness    Today, patient ***  Aortic valve stenosis Echo December 2023 showed normal LV/RV function, Grade I DD, mild to moderate MR, mild AI, moderate aortic stenosis mean gradient 14.7 mmHg.  Patient*** - Update echo.  Hypertension BP today***  Hyperlipidemia LDL 58 July 2024, at goal. - Continue atorvastatin . - Continue to follow with PCP. - Continue Lotrel, carvedilol .  ROS: All other systems reviewed and are otherwise negative except as noted in History of Present Illness.  EKGs/Labs Reviewed        No results found for requested labs within last 365 days.   No results found for requested labs within last 365 days.   01/15/2023: TSH 1.570   No results found for requested labs within last 365 days.  ***  Risk Assessment/Calculations    {Does this patient have ATRIAL FIBRILLATION?[phone]} No BP recorded.  {Refresh Note OR Click here to enter BP  :1}***        Physical Exam    VS:  There were no vitals taken for this visit. , BMI There is no height or weight on file to calculate BMI.  GEN: Well nourished, well developed, in no acute distress. Neck: No JVD or carotid bruits. Cardiac: *** RRR. *** No murmur. No rubs  or gallops.   Respiratory:  Respirations regular and unlabored. Clear to auscultation without rales, wheezing or rhonchi. GI: Soft, nontender, nondistended. Extremities: Radials/DP/PT 2+ and equal bilaterally. No clubbing or cyanosis. No edema ***  Skin: Warm and dry, no rash. Neuro: Strength intact.  Assessment & Plan   ***  Disposition: ***     {Are you ordering a CV  Procedure (e.g. stress test, cath, DCCV, TEE, etc)?   Press F2        :[id number]}   Signed, Lonell Rives. Shamari Lofquist, DNP, NP-C

## 2023-12-12 ENCOUNTER — Ambulatory Visit: Attending: Student | Admitting: Student

## 2023-12-12 ENCOUNTER — Encounter: Payer: Self-pay | Admitting: Student

## 2023-12-12 VITALS — BP 150/68 | HR 63 | Ht <= 58 in | Wt 116.2 lb

## 2023-12-12 DIAGNOSIS — I10 Essential (primary) hypertension: Secondary | ICD-10-CM | POA: Diagnosis not present

## 2023-12-12 DIAGNOSIS — E782 Mixed hyperlipidemia: Secondary | ICD-10-CM

## 2023-12-12 DIAGNOSIS — I350 Nonrheumatic aortic (valve) stenosis: Secondary | ICD-10-CM

## 2023-12-12 NOTE — Patient Instructions (Signed)
 Medication Instructions:  Your physician recommends that you continue on your current medications as directed. Please refer to the Current Medication list given to you today.   *If you need a refill on your cardiac medications before your next appointment, please call your pharmacy*  Lab Work: None ordered at this time  If you have labs (blood work) drawn today and your tests are completely normal, you will receive your results only by: MyChart Message (if you have MyChart) OR A paper copy in the mail If you have any lab test that is abnormal or we need to change your treatment, we will call you to review the results.  Testing/Procedures: Your physician has requested that you have an echocardiogram. Echocardiography is a painless test that uses sound waves to create images of your heart. It provides your doctor with information about the size and shape of your heart and how well your heart's chambers and valves are working.   You Lofgren receive an ultrasound enhancing agent through an IV if needed to better visualize your heart during the echo. This procedure takes approximately one hour.  There are no restrictions for this procedure.  This will take place at [address] [facility]) [address], [place] [address]  Please note: We ask at that you not bring children with you during ultrasound (echo/ vascular) testing. Due to room size and safety concerns, children are not allowed in the ultrasound rooms during exams. Our front office staff cannot provide observation of children in our lobby area while testing is being conducted. An adult accompanying a patient to their appointment will only be allowed in the ultrasound room at the discretion of the ultrasound technician under special circumstances. We apologize for any inconvenience.   Follow-Up: At [facility], you and your health needs are our priority.  As part of our continuing mission to provide you with exceptional heart  care, our providers are all part of one team.  This team includes your primary Cardiologist (physician) and Advanced Practice Providers or APPs (Physician Assistants and Nurse Practitioners) who all work together to provide you with the care you need, when you need it.  Your next appointment:   1 year(s)  Provider:   You Rainbow see Constancia Delton, MD or one of the following Advanced Practice Providers on your designated Care Team:   Laneta Pintos, NP Gildardo Labrador, PA-C Varney Gentleman, PA-C Cadence Edgewood, PA-C Ronald Cockayne, NP Morey Ar, NP    We recommend signing up for the patient portal called MyChart.  Sign up information is provided on this After Visit Summary.  MyChart is used to connect with patients for Virtual Visits (Telemedicine).  Patients are able to view lab/test results, encounter notes, upcoming appointments, etc.  Non-urgent messages can be sent to your provider as well.   To learn more about what you can do with MyChart, go to [email or web address].

## 2023-12-17 ENCOUNTER — Other Ambulatory Visit: Payer: Self-pay | Admitting: Physician Assistant

## 2023-12-17 ENCOUNTER — Telehealth: Payer: Self-pay

## 2023-12-17 DIAGNOSIS — I10 Essential (primary) hypertension: Secondary | ICD-10-CM

## 2023-12-17 MED ORDER — AMLODIPINE BESYLATE 2.5 MG PO TABS: 2.5000 mg | ORAL_TABLET | Freq: Every day | ORAL | 3 refills | Status: AC

## 2023-12-17 MED ORDER — HYDRALAZINE HCL 10 MG PO TABS: 10.0000 mg | ORAL_TABLET | Freq: Every day | ORAL | 2 refills | Status: DC | PRN

## 2023-12-17 NOTE — Telephone Encounter (Signed)
 Pt advised stopped Lotrel  and go back to amlodipine  2.5 mg and continue carvedilol  and lasix  as prescribed and take hydralazine 10 mg if BP is higher than 150/90

## 2023-12-27 ENCOUNTER — Other Ambulatory Visit

## 2023-12-27 DIAGNOSIS — R809 Proteinuria, unspecified: Secondary | ICD-10-CM | POA: Diagnosis not present

## 2023-12-27 DIAGNOSIS — D631 Anemia in chronic kidney disease: Secondary | ICD-10-CM | POA: Diagnosis not present

## 2023-12-27 DIAGNOSIS — R319 Hematuria, unspecified: Secondary | ICD-10-CM | POA: Diagnosis not present

## 2023-12-27 DIAGNOSIS — N2581 Secondary hyperparathyroidism of renal origin: Secondary | ICD-10-CM | POA: Diagnosis not present

## 2023-12-27 DIAGNOSIS — N1832 Chronic kidney disease, stage 3b: Secondary | ICD-10-CM | POA: Diagnosis not present

## 2023-12-27 DIAGNOSIS — I129 Hypertensive chronic kidney disease with stage 1 through stage 4 chronic kidney disease, or unspecified chronic kidney disease: Secondary | ICD-10-CM | POA: Diagnosis not present

## 2024-01-05 DIAGNOSIS — J449 Chronic obstructive pulmonary disease, unspecified: Secondary | ICD-10-CM | POA: Diagnosis not present

## 2024-01-08 ENCOUNTER — Encounter: Payer: Self-pay | Admitting: Nurse Practitioner

## 2024-01-08 DIAGNOSIS — R319 Hematuria, unspecified: Secondary | ICD-10-CM | POA: Diagnosis not present

## 2024-01-08 DIAGNOSIS — N1832 Chronic kidney disease, stage 3b: Secondary | ICD-10-CM | POA: Diagnosis not present

## 2024-01-08 DIAGNOSIS — D631 Anemia in chronic kidney disease: Secondary | ICD-10-CM | POA: Diagnosis not present

## 2024-01-08 DIAGNOSIS — E875 Hyperkalemia: Secondary | ICD-10-CM | POA: Diagnosis not present

## 2024-01-08 DIAGNOSIS — N2581 Secondary hyperparathyroidism of renal origin: Secondary | ICD-10-CM | POA: Diagnosis not present

## 2024-01-08 DIAGNOSIS — R809 Proteinuria, unspecified: Secondary | ICD-10-CM | POA: Diagnosis not present

## 2024-01-08 DIAGNOSIS — I129 Hypertensive chronic kidney disease with stage 1 through stage 4 chronic kidney disease, or unspecified chronic kidney disease: Secondary | ICD-10-CM | POA: Diagnosis not present

## 2024-01-14 ENCOUNTER — Ambulatory Visit (INDEPENDENT_AMBULATORY_CARE_PROVIDER_SITE_OTHER): Payer: PPO | Admitting: Physician Assistant

## 2024-01-14 ENCOUNTER — Encounter: Payer: Self-pay | Admitting: Physician Assistant

## 2024-01-14 VITALS — BP 164/79 | HR 64 | Temp 98.2°F | Resp 16 | Ht <= 58 in | Wt 115.4 lb

## 2024-01-14 DIAGNOSIS — Z0000 Encounter for general adult medical examination without abnormal findings: Secondary | ICD-10-CM

## 2024-01-14 DIAGNOSIS — N1832 Chronic kidney disease, stage 3b: Secondary | ICD-10-CM | POA: Diagnosis not present

## 2024-01-14 DIAGNOSIS — I10 Essential (primary) hypertension: Secondary | ICD-10-CM

## 2024-01-14 NOTE — Progress Notes (Signed)
 [facility] [address] [place], [place] [address]  Internal MEDICINE  Office Visit Note  Patient Name: Brenda Tucker  [id number]  [id number]  Date of Service: 01/14/2024  Chief Complaint  Patient presents with   Medicare Wellness   Hypertension    HPI Aicha presents for an annual well visit Well-appearing 89 y.o.female DEXA scan: declines updated testing Other concerns: reports vision changes/worsening over last few weeks, seems like something floating, has appt with eye doctor. Has hx of MD and glaucoma. Advised to call for sooner appt. She does not drive -BP 857/42 at home this morning, insurance home practitioner visit found BP at 136/60. Has not taken any hydralazine  which was added to take as needed for elevated BP. She does have white coat hypertension and given acceptable reading during home visit will continue with current meds. -followed by nephrology and denies any changes -also followed by cardiology and states plan for echo at end of year     01/14/2024   11:24 AM 01/11/2023   10:56 AM 01/05/2022   11:01 AM  MMSE - Mini Mental State Exam  Orientation to time 4 5 5   Orientation to Place 4 5 5   Registration 2 3 3   Attention/ Calculation 5 5 5   Recall 3 3 3   Language- name 2 objects 2 2 2   Language- repeat 1 1 1   Language- follow 3 step command 3 3 3   Language- read & follow direction 1 1 1   Write a sentence 1 1 1   Copy design 1 1 1   Total score 27 30 30     Functional Status Survey: Is the patient deaf or have difficulty hearing?: No Does the patient have difficulty seeing, even when wearing glasses/contacts?: No Does the patient have difficulty concentrating, remembering, or making decisions?: No Does the patient have difficulty walking or climbing stairs?: No Does the patient have difficulty dressing or bathing?: No Does the patient have difficulty doing errands alone such as visiting a doctor's office or shopping?: No     01/11/2023   10:59 AM  06/14/2023   11:24 AM 07/06/2023   10:47 AM 11/01/2023   11:06 AM 01/14/2024   11:24 AM  Fall Risk  Falls in the past year? 0 0 0 0 1  Was there an injury with Fall?     0  Fall Risk Category Calculator     2  Patient at Risk for Falls Due to No Fall Risks  No Fall Risks    Fall risk Follow up Falls evaluation completed  Falls evaluation completed         01/14/2024   11:24 AM  Depression screen PHQ 2/9  Decreased Interest 0  Down, Depressed, Hopeless 0  PHQ - 2 Score 0        No data to display            Current Medication: Outpatient Encounter Medications as of 01/14/2024  Medication Sig   acetaminophen  (TYLENOL ) 500 MG tablet Take 500 mg by mouth every 6 (six) hours as needed for moderate pain or headache.   albuterol  (VENTOLIN  HFA) 108 (90 Base) MCG/ACT inhaler Inhale 2 puffs into the lungs every 6 (six) hours as needed for wheezing or shortness of breath.   amLODipine  (NORVASC ) 2.5 MG tablet Take 1 tablet (2.5 mg total) by mouth daily.   atorvastatin  (LIPITOR) 10 MG tablet TAKE 1 TABLET BY MOUTH EVERY OTHER DAY   budesonide -formoterol  (SYMBICORT ) 160-4.5 MCG/ACT inhaler Inhale 2 puffs into  the lungs 2 (two) times daily.   calcium  carbonate (OSCAL) 1500 (600 Ca) MG TABS tablet Take 600 mg of elemental calcium  by mouth daily.   carvedilol  (COREG ) 25 MG tablet TAKE 1/2 TABLET (12.5 MG) BY MOUTH TWICE DAILY WITH A MEAL   Cholecalciferol (VITAMIN D ) 50 MCG (2000 UT) tablet Take 2,000 Units by mouth daily.   furosemide  (LASIX ) 20 MG tablet TAKE 1 TABLET (20MG ) BY MOUTH DAILY   hydrALAZINE  (APRESOLINE ) 10 MG tablet Take 1 tablet (10 mg total) by mouth daily as needed.   levothyroxine  (SYNTHROID ) 50 MCG tablet TAKE 1 TABLET (50 MCG) BY MOUTH DAILY BEFORE BREAKFAST   multivitamin-lutein (OCUVITE-LUTEIN) CAPS capsule Take 1 capsule by mouth daily.   OXYGEN  Inhale into the lungs. 2 litre AHP   ROCKLATAN 0.02-0.005 % SOLN Apply 1 drop to eye at bedtime.   vitamin B-12  (CYANOCOBALAMIN) 1000 MCG tablet Take 1,000 mcg by mouth daily.   [DISCONTINUED] fluticasone -salmeterol (ADVAIR DISKUS) 250-50 MCG/ACT AEPB INHALE 1 PUFF INTO THE LUNGS TWICE DAILY   No facility-administered encounter medications on file as of 01/14/2024.    Surgical History: Past Surgical History:  Procedure Laterality Date   ABDOMINAL HYSTERECTOMY     APPENDECTOMY     CHOLECYSTECTOMY N/A 03/12/2020   Procedure: LAPAROSCOPIC CHOLECYSTECTOMY;  Surgeon: Marolyn Nest, MD;  Location: [facility] ORS;  Service: General;  Laterality: N/A;   COLONOSCOPY WITH PROPOFOL  N/A 07/09/2015   Procedure: COLONOSCOPY WITH PROPOFOL ;  Surgeon: Lamar ONEIDA Holmes, MD;  Location: [facility] ENDOSCOPY;  Service: Endoscopy;  Laterality: N/A;    Medical History: Past Medical History:  Diagnosis Date   Arthritis    Asthma    Cancer (HCC)    skin, colon polyp   Chronic kidney disease    Complication of anesthesia    asthma attack per pt   COPD (chronic obstructive pulmonary disease) (HCC)    Dyspnea    Hypertension    Hypothyroidism     Family History: Family History  Problem Relation Age of Onset   Heart attack Father    Early death Sister        MVA   Stomach cancer Brother    Cancer Brother        shoulder   Lung cancer Brother    Cancer Sister    Cancer Sister    Early death Sister        husband killed her   Breast cancer Neg Hx     Social History   Socioeconomic History   Marital status: Widowed    Spouse name: Not on file   Number of children: Not on file   Years of education: Not on file   Highest education level: Not on file  Occupational History   Not on file  Tobacco Use   Smoking status: Never   Smokeless tobacco: Never   Tobacco comments:    second hand smoke  Vaping Use   Vaping status: Never Used  Substance and Sexual Activity   Alcohol use: No   Drug use: No   Sexual activity: Not on file  Other Topics Concern   Not on file  Social History Narrative   Not on file    Social Drivers of Health   Financial Resource Strain: Low Risk  (12/15/2020)   Overall Financial Resource Strain (CARDIA)    Difficulty of Paying Living Expenses: Not very hard  Food Insecurity: Not on file  Transportation Needs: Not on file  Physical Activity: Not on file  Stress: Not on file  Social Connections: Not on file  Intimate Partner Violence: Not on file      Review of Systems  Constitutional:  Negative for chills, fatigue and unexpected weight change.  HENT:  Positive for hearing loss. Negative for congestion, postnasal drip, rhinorrhea, sneezing and sore throat.   Eyes:  Positive for visual disturbance.  Respiratory:  Positive for shortness of breath (intermittent). Negative for cough, chest tightness and wheezing.   Cardiovascular:  Negative for chest pain and palpitations.  Gastrointestinal:  Negative for abdominal pain, constipation, diarrhea, nausea and vomiting.  Genitourinary:  Negative for dysuria and frequency.  Skin:  Negative for rash.  Neurological: Negative.  Negative for tremors and numbness.  Psychiatric/Behavioral:  Behavioral problem: Depression. The patient is not nervous/anxious.     Vital Signs: BP (!) 164/79   Pulse 64   Temp 98.2 F (36.8 C)   Resp 16   Ht 4' 8 (1.422 m)   Wt 115 lb 6.4 oz (52.3 kg)   SpO2 95%   BMI 25.87 kg/m    Physical Exam Vitals and nursing note reviewed.  Constitutional:      Appearance: Normal appearance.  HENT:     Head: Normocephalic and atraumatic.  Cardiovascular:     Rate and Rhythm: Normal rate and regular rhythm.  Pulmonary:     Effort: Pulmonary effort is normal.     Breath sounds: Normal breath sounds.  Neurological:     Mental Status: She is alert.  Psychiatric:        Mood and Affect: Mood normal.        Behavior: Behavior normal.        Assessment/Plan: 1. Encounter for annual wellness exam in Medicare patient (Primary) AWV performed  2. White coat syndrome with diagnosis of  hypertension Elevated in office, but better controlled at home. Has hydralazine  to use PRN and will recheck at home today  3. Stage 3b chronic kidney disease (HCC) Followed by nephrology    General Counseling: Mckennah verbalizes understanding of the findings of todays visit and agrees with plan of treatment. I have discussed any further diagnostic evaluation that Tweten be needed or ordered today. We also reviewed her medications today. she has been encouraged to call the office with any questions or concerns that should arise related to todays visit.    No orders of the defined types were placed in this encounter.   No orders of the defined types were placed in this encounter.   Return in about 4 months (around 05/16/2024) for general follow up.   Total time spent:35 Minutes Time spent includes review of chart, medications, test results, and follow up plan with the patient.   [place] Controlled Substance Database was reviewed by me.  This patient was seen by Tinnie Pro, PA-C in collaboration with Dr. Sigrid Bathe as a part of collaborative care agreement.  Tinnie Pro, PA-C Internal medicine

## 2024-02-01 ENCOUNTER — Telehealth: Payer: Self-pay

## 2024-02-01 NOTE — Progress Notes (Signed)
   02/01/2024  Patient ID: Brenda Tucker, female   DOB: December 11, 1933, 89 y.o.   MRN: [id number]  This patient is appearing on a report for being at risk of failing the adherence measure for identified medications this calendar year.   Medication Adherence Summary (STAR/HEDIS Monitoring): Adherence Category: hypertension (ACEi/ARB)    Drug Name: Losartan  100 mg  Last Fill or Sold Date:07/17/2023 Days' Supply: 90    Notes: ? Adherence data pulled from pharmacy claims portal Dr. Annemarie. However, medication was discontinued and no longer on active medication list.  ? Reviewed barriers to adherence: N/A. ? Plan: None  Dorcas Solian, PharmD Clinical Pharmacist Cell: [phone]

## 2024-02-05 DIAGNOSIS — J449 Chronic obstructive pulmonary disease, unspecified: Secondary | ICD-10-CM | POA: Diagnosis not present

## 2024-02-06 DIAGNOSIS — H40003 Preglaucoma, unspecified, bilateral: Secondary | ICD-10-CM | POA: Diagnosis not present

## 2024-02-06 DIAGNOSIS — H401111 Primary open-angle glaucoma, right eye, mild stage: Secondary | ICD-10-CM | POA: Diagnosis not present

## 2024-02-13 DIAGNOSIS — H353134 Nonexudative age-related macular degeneration, bilateral, advanced atrophic with subfoveal involvement: Secondary | ICD-10-CM | POA: Diagnosis not present

## 2024-02-13 DIAGNOSIS — H353190 Nonexudative age-related macular degeneration, unspecified eye, stage unspecified: Secondary | ICD-10-CM | POA: Diagnosis not present

## 2024-02-13 DIAGNOSIS — H40003 Preglaucoma, unspecified, bilateral: Secondary | ICD-10-CM | POA: Diagnosis not present

## 2024-02-14 ENCOUNTER — Ambulatory Visit: Payer: PPO | Admitting: Nurse Practitioner

## 2024-02-18 ENCOUNTER — Telehealth: Payer: Self-pay

## 2024-02-18 NOTE — Progress Notes (Signed)
   02/18/2024  Patient ID: Brenda Tucker, female   DOB: May 09, 1934, 89 y.o.   MRN: [id number]  This patient is appearing on a report for being at risk of failing the adherence measure for identified medications this calendar year.   Medication Adherence Summary (STAR/HEDIS Monitoring): Adherence Category: hypertension (ACEi/ARB)    Drug Name: Losartan  100 mg  Last Fill or Sold Date:07/17/2023 Days' Supply: 90 - Losartan  100 mg and amlodipine / benazepril  2.5-10 mg are both on the report. Benazepril  2.5-10 mg last filled on 12/07/2023 for 90 days supply; losartan  fill history previously noted. Currently, patient is prescribed amlodipine  2.5 mg daily and last picked up medication on 01/08/2024 for 90 days supply.     Notes: ? Adherence data pulled from pharmacy claims portal Dr. Annemarie. However, medication was discontinued and no longer on active medication list.  ? Reviewed barriers to adherence: N/A. ? Plan: None  Dorcas Solian, PharmD Clinical Pharmacist Cell: [phone]

## 2024-02-19 ENCOUNTER — Encounter: Payer: Self-pay | Admitting: Internal Medicine

## 2024-02-19 ENCOUNTER — Ambulatory Visit: Admitting: Internal Medicine

## 2024-02-19 VITALS — BP 140/80 | HR 68 | Temp 97.8°F | Resp 16 | Ht <= 58 in | Wt 115.0 lb

## 2024-02-19 DIAGNOSIS — N1832 Chronic kidney disease, stage 3b: Secondary | ICD-10-CM | POA: Diagnosis not present

## 2024-02-19 DIAGNOSIS — J449 Chronic obstructive pulmonary disease, unspecified: Secondary | ICD-10-CM

## 2024-02-19 DIAGNOSIS — R0602 Shortness of breath: Secondary | ICD-10-CM

## 2024-02-19 DIAGNOSIS — J301 Allergic rhinitis due to pollen: Secondary | ICD-10-CM | POA: Diagnosis not present

## 2024-02-19 DIAGNOSIS — G4733 Obstructive sleep apnea (adult) (pediatric): Secondary | ICD-10-CM | POA: Diagnosis not present

## 2024-02-19 NOTE — Patient Instructions (Signed)

## 2024-02-19 NOTE — Progress Notes (Signed)
 [facility] [address] [place], [place] [address]  Pulmonary Sleep Medicine   Office Visit Note  Patient Name: Brenda Tucker DOB: 1933/10/11 MRN [id number]  Date of Service: 02/19/2024  Complaints/HPI: She is doing about the same. She has beseline shortness of breath. She is not coughing up anything. She has some wheeze noted. She denies fevers or chills. No recent admissions. Denies having chest pain. She states she is still using her inhalers as prescribed. She states she feels better after using the inhalers.  Office Spirometry Results:     ROS  General: (-) fever, (-) chills, (-) night sweats, (-) weakness Skin: (-) rashes, (-) itching,. Eyes: (-) visual changes, (-) redness, (-) itching. Nose and Sinuses: (-) nasal stuffiness or itchiness, (-) postnasal drip, (-) nosebleeds, (-) sinus trouble. Mouth and Throat: (-) sore throat, (-) hoarseness. Neck: (-) swollen glands, (-) enlarged thyroid , (-) neck pain. Respiratory: - cough, (-) bloody sputum, + shortness of breath, - wheezing. Cardiovascular: + ankle swelling, (-) chest pain. Lymphatic: (-) lymph node enlargement. Neurologic: (-) numbness, (-) tingling. Psychiatric: (-) anxiety, (-) depression   Current Medication: Outpatient Encounter Medications as of 02/19/2024  Medication Sig   acetaminophen  (TYLENOL ) 500 MG tablet Take 500 mg by mouth every 6 (six) hours as needed for moderate pain or headache.   albuterol  (VENTOLIN  HFA) 108 (90 Base) MCG/ACT inhaler Inhale 2 puffs into the lungs every 6 (six) hours as needed for wheezing or shortness of breath.   amLODipine  (NORVASC ) 2.5 MG tablet Take 1 tablet (2.5 mg total) by mouth daily.   atorvastatin  (LIPITOR) 10 MG tablet TAKE 1 TABLET BY MOUTH EVERY OTHER DAY   budesonide -formoterol  (SYMBICORT ) 160-4.5 MCG/ACT inhaler Inhale 2 puffs into the lungs 2 (two) times daily.   calcium  carbonate (OSCAL) 1500 (600 Ca) MG TABS tablet Take 600 mg of elemental calcium  by  mouth daily.   carvedilol  (COREG ) 25 MG tablet TAKE 1/2 TABLET (12.5 MG) BY MOUTH TWICE DAILY WITH A MEAL   Cholecalciferol (VITAMIN D ) 50 MCG (2000 UT) tablet Take 2,000 Units by mouth daily.   furosemide  (LASIX ) 20 MG tablet TAKE 1 TABLET (20MG ) BY MOUTH DAILY   hydrALAZINE  (APRESOLINE ) 10 MG tablet Take 1 tablet (10 mg total) by mouth daily as needed.   levothyroxine  (SYNTHROID ) 50 MCG tablet TAKE 1 TABLET (50 MCG) BY MOUTH DAILY BEFORE BREAKFAST   multivitamin-lutein (OCUVITE-LUTEIN) CAPS capsule Take 1 capsule by mouth daily.   OXYGEN  Inhale into the lungs. 2 litre AHP   ROCKLATAN 0.02-0.005 % SOLN Apply 1 drop to eye at bedtime.   vitamin B-12 (CYANOCOBALAMIN) 1000 MCG tablet Take 1,000 mcg by mouth daily.   [DISCONTINUED] fluticasone -salmeterol (ADVAIR DISKUS) 250-50 MCG/ACT AEPB INHALE 1 PUFF INTO THE LUNGS TWICE DAILY   No facility-administered encounter medications on file as of 02/19/2024.    Surgical History: Past Surgical History:  Procedure Laterality Date   ABDOMINAL HYSTERECTOMY     APPENDECTOMY     CHOLECYSTECTOMY N/A 03/12/2020   Procedure: LAPAROSCOPIC CHOLECYSTECTOMY;  Surgeon: Marolyn Nest, MD;  Location: [facility] ORS;  Service: General;  Laterality: N/A;   COLONOSCOPY WITH PROPOFOL  N/A 07/09/2015   Procedure: COLONOSCOPY WITH PROPOFOL ;  Surgeon: Lamar ONEIDA Holmes, MD;  Location: [facility] ENDOSCOPY;  Service: Endoscopy;  Laterality: N/A;    Medical History: Past Medical History:  Diagnosis Date   Arthritis    Asthma    Cancer (HCC)    skin, colon polyp   Chronic kidney disease    Complication  of anesthesia    asthma attack per pt   COPD (chronic obstructive pulmonary disease) (HCC)    Dyspnea    Hypertension    Hypothyroidism     Family History: Family History  Problem Relation Age of Onset   Heart attack Father    Early death Sister        MVA   Stomach cancer Brother    Cancer Brother        shoulder   Lung cancer Brother    Cancer Sister    Cancer  Sister    Early death Sister        husband killed her   Breast cancer Neg Hx     Social History: Social History   Socioeconomic History   Marital status: Widowed    Spouse name: Not on file   Number of children: Not on file   Years of education: Not on file   Highest education level: Not on file  Occupational History   Not on file  Tobacco Use   Smoking status: Never   Smokeless tobacco: Never   Tobacco comments:    second hand smoke  Vaping Use   Vaping status: Never Used  Substance and Sexual Activity   Alcohol use: No   Drug use: No   Sexual activity: Not on file  Other Topics Concern   Not on file  Social History Narrative   Not on file   Social Drivers of Health   Financial Resource Strain: Low Risk  (12/15/2020)   Overall Financial Resource Strain (CARDIA)    Difficulty of Paying Living Expenses: Not very hard  Food Insecurity: Not on file  Transportation Needs: Not on file  Physical Activity: Not on file  Stress: Not on file  Social Connections: Not on file  Intimate Partner Violence: Not on file    Vital Signs: Blood pressure (!) 140/80, pulse 68, temperature 97.8 F (36.6 C), resp. rate 16, height 4' 8 (1.422 m), weight 115 lb (52.2 kg), SpO2 95%.  Examination: General Appearance: The patient is well-developed, well-nourished, and in no distress. Skin: Gross inspection of skin unremarkable. Head: normocephalic, no gross deformities. Eyes: no gross deformities noted. ENT: ears appear grossly normal no exudates. Neck: Supple. No thyromegaly. No LAD. Respiratory: no rhonchi noted. Cardiovascular: Normal S1 and S2 without murmur or rub. Extremities: No cyanosis. pulses are equal. Neurologic: Alert and oriented. No involuntary movements.  LABS: No results found for this or any previous visit (from the past 2160 hours).  Radiology: DG Chest 2 View Result Date: 06/14/2023 CLINICAL DATA:  Chest pain with cough EXAM: CHEST - 2 VIEW COMPARISON:   01/17/2018 FINDINGS: The heart size and mediastinal contours are stable. Aortic atherosclerosis. No focal airspace consolidation, pleural effusion, or pneumothorax. No acute bony finding. IMPRESSION: No active cardiopulmonary disease. Electronically Signed   By: Mabel Converse D.O.   On: 06/14/2023 13:13    No results found.  No results found.  Assessment and Plan: Patient Active Problem List   Diagnosis Date Noted   Generalized abdominal pain 12/30/2019   Stage 3 chronic kidney disease (HCC) 06/30/2019   Urinary tract infection with hematuria 12/20/2018   Low back pain 12/20/2018   Bilateral carotid artery stenosis 06/19/2018   Cellulitis of finger of right hand 03/13/2018   Encounter for screening mammogram for malignant neoplasm of breast 12/18/2017   Vitamin D  deficiency 12/18/2017   Dysuria 12/18/2017   Chronic obstructive pulmonary disease (HCC) 08/17/2017   SOB (shortness  of breath) 08/17/2017   GERD (gastroesophageal reflux disease) 08/17/2017   Hyperlipidemia 08/17/2017   Hypertension 08/17/2017   Hypothyroidism 08/17/2017   Osteoarthritis 08/17/2017    1. COPD with hypoxia (HCC) (Primary) She is at [facility] states she is on oxygen  and she will continue to use it as prescribed along with all inhalers  2. Shortness of breath - Spirometry with Graph  3. OSA on CPAP Will continue with PAP therapy at current pressures  4. Stage 3b chronic kidney disease (HCC) She follows with nephrology to monitor her renal issues   General Counseling: I have discussed the findings of the evaluation and examination with [facility].  I have also discussed any further diagnostic evaluation thatmay be needed or ordered today. Consetta verbalizes understanding of the findings of todays visit. We also reviewed her medications today and discussed drug interactions and side effects including but not limited excessive drowsiness and altered mental states. We also discussed that there is always a risk  not just to her but also people around her. she has been encouraged to call the office with any questions or concerns that should arise related to todays visit.  Orders Placed This Encounter  Procedures   Spirometry with Graph    Where should this test be performed?:   [facility]     Time spent: 34  I have personally obtained a history, examined the patient, evaluated laboratory and imaging results, formulated the assessment and plan and placed orders.    Elfreda DELENA Bathe, MD [facility] Pulmonary and Critical Care Sleep medicine

## 2024-03-02 ENCOUNTER — Other Ambulatory Visit: Payer: Self-pay | Admitting: Physician Assistant

## 2024-03-02 ENCOUNTER — Other Ambulatory Visit: Payer: Self-pay | Admitting: Internal Medicine

## 2024-03-02 DIAGNOSIS — E782 Mixed hyperlipidemia: Secondary | ICD-10-CM

## 2024-03-06 ENCOUNTER — Telehealth: Payer: Self-pay

## 2024-03-06 NOTE — Progress Notes (Signed)
   03/06/2024  Patient ID: Brenda Tucker, female   DOB: [birth year], 89 y.o.   MRN: [id number]  This patient is appearing on a report for being at risk of failing the adherence measure for identified medications this calendar year.   Medication Adherence Summary (STAR/HEDIS Monitoring): Adherence Category: hypertension (ACEi/ARB)    Drug Name: Losartan  100 mg  Last Fill or Sold Date:07/17/2023 Days' Supply: 90  Drug Name: Atorvastatin  10 mg  Sold Date:12/07/2023 Days' Supply: 90  - No refills remaining and due for a refill. I called to verify she is taking medications and denies missing doses. I called patient to get consent to refill new prescription of atorvastatin  in which patient agreed. Update: Medication is filled and ready for pickup    Notes: ? Adherence data pulled from pharmacy claims portal Dr. Annemarie.  ? Reviewed barriers to adherence: N/A. ? Plan: Will call pharmacy to refill medication  Dorcas Solian, PharmD Clinical Pharmacist Cell: [phone]

## 2024-03-07 DIAGNOSIS — J449 Chronic obstructive pulmonary disease, unspecified: Secondary | ICD-10-CM | POA: Diagnosis not present

## 2024-03-16 ENCOUNTER — Other Ambulatory Visit: Payer: Self-pay | Admitting: Physician Assistant

## 2024-03-16 DIAGNOSIS — I10 Essential (primary) hypertension: Secondary | ICD-10-CM

## 2024-03-21 NOTE — Progress Notes (Signed)
   03/21/2024  Patient ID: Brenda Tucker, female   DOB: 20-Jul-1933, 89 y.o.   MRN: [id number]  Pharmacy Quality Measure Review  This patient is appearing on a report for being at risk of failing the adherence measure for hypertension (ACEi/ARB) medications this calendar year.   Medication: Losartan  and Amlodipine /Benazepril   Both medications only filled once for 2025. Losartan  was stopped by nephro per chart review due to elevated potassium levels. Patient was unable to tolerate benazepril . No further action needed at this time.  Jon VEAR Lindau, PharmD Clinical Pharmacist [phone]

## 2024-03-25 DIAGNOSIS — H353124 Nonexudative age-related macular degeneration, left eye, advanced atrophic with subfoveal involvement: Secondary | ICD-10-CM | POA: Diagnosis not present

## 2024-03-25 DIAGNOSIS — H353190 Nonexudative age-related macular degeneration, unspecified eye, stage unspecified: Secondary | ICD-10-CM | POA: Diagnosis not present

## 2024-03-25 DIAGNOSIS — H353114 Nonexudative age-related macular degeneration, right eye, advanced atrophic with subfoveal involvement: Secondary | ICD-10-CM | POA: Diagnosis not present

## 2024-03-26 ENCOUNTER — Telehealth: Payer: Self-pay | Admitting: Internal Medicine

## 2024-03-26 NOTE — Telephone Encounter (Addendum)
 02/05/2024 O2 order signed. Faxed back to [facility]: [phone]. Scanned-Toni

## 2024-04-06 DIAGNOSIS — J449 Chronic obstructive pulmonary disease, unspecified: Secondary | ICD-10-CM | POA: Diagnosis not present

## 2024-04-07 DIAGNOSIS — G4733 Obstructive sleep apnea (adult) (pediatric): Secondary | ICD-10-CM | POA: Diagnosis not present

## 2024-05-02 ENCOUNTER — Encounter: Payer: Self-pay | Admitting: Emergency Medicine

## 2024-05-02 ENCOUNTER — Other Ambulatory Visit: Payer: Self-pay

## 2024-05-02 ENCOUNTER — Emergency Department

## 2024-05-02 ENCOUNTER — Inpatient Hospital Stay
Admission: EM | Admit: 2024-05-02 | Discharge: 2024-05-04 | DRG: 281 | Disposition: A | Discharge status: Home / Self Care | Attending: Internal Medicine | Admitting: Internal Medicine

## 2024-05-02 DIAGNOSIS — Z79890 Hormone replacement therapy (postmenopausal): Secondary | ICD-10-CM

## 2024-05-02 DIAGNOSIS — E871 Hypo-osmolality and hyponatremia: Secondary | ICD-10-CM | POA: Diagnosis not present

## 2024-05-02 DIAGNOSIS — R54 Age-related physical debility: Secondary | ICD-10-CM | POA: Diagnosis present

## 2024-05-02 DIAGNOSIS — D631 Anemia in chronic kidney disease: Secondary | ICD-10-CM | POA: Diagnosis present

## 2024-05-02 DIAGNOSIS — I10 Essential (primary) hypertension: Secondary | ICD-10-CM | POA: Diagnosis present

## 2024-05-02 DIAGNOSIS — Z7951 Long term (current) use of inhaled steroids: Secondary | ICD-10-CM

## 2024-05-02 DIAGNOSIS — E039 Hypothyroidism, unspecified: Secondary | ICD-10-CM | POA: Diagnosis present

## 2024-05-02 DIAGNOSIS — I160 Hypertensive urgency: Secondary | ICD-10-CM | POA: Diagnosis not present

## 2024-05-02 DIAGNOSIS — J449 Chronic obstructive pulmonary disease, unspecified: Secondary | ICD-10-CM | POA: Diagnosis present

## 2024-05-02 DIAGNOSIS — I480 Paroxysmal atrial fibrillation: Secondary | ICD-10-CM | POA: Diagnosis present

## 2024-05-02 DIAGNOSIS — N1832 Chronic kidney disease, stage 3b: Secondary | ICD-10-CM | POA: Diagnosis not present

## 2024-05-02 DIAGNOSIS — J4489 Other specified chronic obstructive pulmonary disease: Secondary | ICD-10-CM | POA: Diagnosis present

## 2024-05-02 DIAGNOSIS — Z79899 Other long term (current) drug therapy: Secondary | ICD-10-CM

## 2024-05-02 DIAGNOSIS — I214 Non-ST elevation (NSTEMI) myocardial infarction: Secondary | ICD-10-CM | POA: Diagnosis not present

## 2024-05-02 DIAGNOSIS — K219 Gastro-esophageal reflux disease without esophagitis: Secondary | ICD-10-CM | POA: Diagnosis present

## 2024-05-02 DIAGNOSIS — I452 Bifascicular block: Secondary | ICD-10-CM | POA: Diagnosis not present

## 2024-05-02 DIAGNOSIS — Z888 Allergy status to other drugs, medicaments and biological substances status: Secondary | ICD-10-CM | POA: Diagnosis not present

## 2024-05-02 DIAGNOSIS — Z8249 Family history of ischemic heart disease and other diseases of the circulatory system: Secondary | ICD-10-CM | POA: Diagnosis not present

## 2024-05-02 DIAGNOSIS — R300 Dysuria: Secondary | ICD-10-CM | POA: Diagnosis present

## 2024-05-02 DIAGNOSIS — Z85828 Personal history of other malignant neoplasm of skin: Secondary | ICD-10-CM

## 2024-05-02 DIAGNOSIS — Z66 Do not resuscitate: Secondary | ICD-10-CM | POA: Diagnosis not present

## 2024-05-02 DIAGNOSIS — I700 Atherosclerosis of aorta: Secondary | ICD-10-CM | POA: Diagnosis not present

## 2024-05-02 DIAGNOSIS — Z860100 Personal history of colon polyps, unspecified: Secondary | ICD-10-CM

## 2024-05-02 DIAGNOSIS — E785 Hyperlipidemia, unspecified: Secondary | ICD-10-CM | POA: Diagnosis not present

## 2024-05-02 DIAGNOSIS — I350 Nonrheumatic aortic (valve) stenosis: Secondary | ICD-10-CM | POA: Diagnosis not present

## 2024-05-02 DIAGNOSIS — Z801 Family history of malignant neoplasm of trachea, bronchus and lung: Secondary | ICD-10-CM

## 2024-05-02 DIAGNOSIS — R079 Chest pain, unspecified: Secondary | ICD-10-CM | POA: Diagnosis not present

## 2024-05-02 DIAGNOSIS — Z809 Family history of malignant neoplasm, unspecified: Secondary | ICD-10-CM

## 2024-05-02 DIAGNOSIS — Z800 Family history of malignant neoplasm of digestive organs: Secondary | ICD-10-CM | POA: Diagnosis not present

## 2024-05-02 DIAGNOSIS — I129 Hypertensive chronic kidney disease with stage 1 through stage 4 chronic kidney disease, or unspecified chronic kidney disease: Secondary | ICD-10-CM | POA: Diagnosis not present

## 2024-05-02 LAB — PROTIME-INR
INR: 1 (ref 0.8–1.2)
Prothrombin Time: 14.2 s (ref 11.4–15.2)

## 2024-05-02 LAB — URINALYSIS, COMPLETE (UACMP) WITH MICROSCOPIC
Bilirubin Urine: NEGATIVE
Glucose, UA: NEGATIVE mg/dL
Hgb urine dipstick: NEGATIVE
Ketones, ur: NEGATIVE mg/dL
Nitrite: NEGATIVE
Protein, ur: 30 mg/dL — AB
Specific Gravity, Urine: 1.005 (ref 1.005–1.030)
Squamous Epithelial / HPF: 0 /HPF (ref 0–5)
WBC, UA: >50 WBC/hpf (ref 0–5)
pH: 8 (ref 5.0–8.0)

## 2024-05-02 LAB — HEPARIN LEVEL (UNFRACTIONATED): Heparin Unfractionated: 0.24 [IU]/mL — ABNORMAL LOW (ref 0.30–0.70)

## 2024-05-02 LAB — BASIC METABOLIC PANEL WITH GFR
Anion gap: 11 (ref 5–15)
BUN: 16 mg/dL (ref 8–23)
CO2: 27 mmol/L (ref 22–32)
Calcium: 8.9 mg/dL (ref 8.9–10.3)
Chloride: 94 mmol/L — ABNORMAL LOW (ref 98–111)
Creatinine, Ser: 1.45 mg/dL — ABNORMAL HIGH (ref 0.44–1.00)
GFR, Estimated: 35 mL/min — ABNORMAL LOW (ref >60)
Glucose, Bld: 163 mg/dL — ABNORMAL HIGH (ref 70–99)
Potassium: 4.4 mmol/L (ref 3.5–5.1)
Sodium: 132 mmol/L — ABNORMAL LOW (ref 135–145)

## 2024-05-02 LAB — CBC
HCT: 34.7 % — ABNORMAL LOW (ref 36.0–46.0)
Hemoglobin: 11.5 g/dL — ABNORMAL LOW (ref 12.0–15.0)
MCH: 32.4 pg (ref 26.0–34.0)
MCHC: 33.1 g/dL (ref 30.0–36.0)
MCV: 97.7 fL (ref 80.0–100.0)
Platelets: 284 K/uL (ref 150–400)
RBC: 3.55 MIL/uL — ABNORMAL LOW (ref 3.87–5.11)
RDW: 12.1 % (ref 11.5–15.5)
WBC: 7.4 K/uL (ref 4.0–10.5)
nRBC: 0 % (ref 0.0–0.2)

## 2024-05-02 LAB — TROPONIN I (HIGH SENSITIVITY)
Troponin I (High Sensitivity): 61 ng/L — ABNORMAL HIGH (ref <18)
Troponin I (High Sensitivity): 101 ng/L (ref <18)

## 2024-05-02 LAB — APTT: aPTT: 27 s (ref 24–36)

## 2024-05-02 MED ORDER — IPRATROPIUM-ALBUTEROL 0.5-2.5 (3) MG/3ML IN SOLN: 3.0000 mL | Freq: Four times a day (QID) | RESPIRATORY_TRACT | Status: DC | PRN

## 2024-05-02 MED ORDER — HYDRALAZINE HCL 20 MG/ML IJ SOLN
5.0000 mg | Freq: Four times a day (QID) | INTRAMUSCULAR | Status: DC | PRN
  Administered 2024-05-02: 5 mg via INTRAVENOUS
  Filled 2024-05-02: qty 1

## 2024-05-02 MED ORDER — VITAMIN B-12 1000 MCG PO TABS
1000.0000 ug | ORAL_TABLET | Freq: Every day | ORAL | Status: DC
  Administered 2024-05-03 – 2024-05-04 (×2): 1000 ug via ORAL
  Filled 2024-05-02 (×2): qty 1

## 2024-05-02 MED ORDER — MELATONIN 5 MG PO TABS: 5.0000 mg | ORAL_TABLET | Freq: Every evening | ORAL | Status: DC | PRN

## 2024-05-02 MED ORDER — HEPARIN BOLUS VIA INFUSION
700.0000 [IU] | Freq: Once | INTRAVENOUS | Status: AC
  Administered 2024-05-03: 700 [IU] via INTRAVENOUS
  Filled 2024-05-02: qty 700

## 2024-05-02 MED ORDER — CARVEDILOL 12.5 MG PO TABS
12.5000 mg | ORAL_TABLET | Freq: Two times a day (BID) | ORAL | Status: DC
  Administered 2024-05-02 – 2024-05-04 (×4): 12.5 mg via ORAL
  Filled 2024-05-02 (×2): qty 1
  Filled 2024-05-02: qty 2
  Filled 2024-05-02: qty 1

## 2024-05-02 MED ORDER — ONDANSETRON HCL 4 MG PO TABS: 4.0000 mg | ORAL_TABLET | Freq: Four times a day (QID) | ORAL | Status: DC | PRN

## 2024-05-02 MED ORDER — HEPARIN BOLUS VIA INFUSION
2800.0000 [IU] | Freq: Once | INTRAVENOUS | Status: AC
Start: 2024-05-02 — End: 2024-05-02
  Administered 2024-05-02: 2800 [IU] via INTRAVENOUS
  Filled 2024-05-02: qty 2800

## 2024-05-02 MED ORDER — VITAMIN D 25 MCG (1000 UNIT) PO TABS
2000.0000 [IU] | ORAL_TABLET | Freq: Every day | ORAL | Status: DC
  Administered 2024-05-02 – 2024-05-04 (×3): 2000 [IU] via ORAL
  Filled 2024-05-02 (×3): qty 2

## 2024-05-02 MED ORDER — ATORVASTATIN CALCIUM 10 MG PO TABS
10.0000 mg | ORAL_TABLET | ORAL | Status: DC
  Administered 2024-05-02 – 2024-05-04 (×2): 10 mg via ORAL
  Filled 2024-05-02 (×3): qty 1

## 2024-05-02 MED ORDER — ACETAMINOPHEN 325 MG PO TABS: 650.0000 mg | ORAL_TABLET | Freq: Four times a day (QID) | ORAL | Status: DC | PRN

## 2024-05-02 MED ORDER — LEVOTHYROXINE SODIUM 50 MCG PO TABS
50.0000 ug | ORAL_TABLET | Freq: Every day | ORAL | Status: DC
  Administered 2024-05-03 – 2024-05-04 (×2): 50 ug via ORAL
  Filled 2024-05-02 (×3): qty 1

## 2024-05-02 MED ORDER — PROSIGHT PO TABS
1.0000 | ORAL_TABLET | Freq: Every day | ORAL | Status: DC
  Administered 2024-05-02 – 2024-05-04 (×3): 1 via ORAL
  Filled 2024-05-02 (×3): qty 1

## 2024-05-02 MED ORDER — HEPARIN (PORCINE) 25000 UT/250ML-% IV SOLN
750.0000 [IU]/h | INTRAVENOUS | Status: DC
  Administered 2024-05-02: 550 [IU]/h via INTRAVENOUS
  Filled 2024-05-02 (×2): qty 250

## 2024-05-02 MED ORDER — ONDANSETRON HCL 4 MG/2ML IJ SOLN: 4.0000 mg | Freq: Four times a day (QID) | INTRAMUSCULAR | Status: DC | PRN

## 2024-05-02 MED ORDER — NETARSUDIL-LATANOPROST 0.02-0.005 % OP SOLN: 1.0000 [drp] | Freq: Every day | OPHTHALMIC | Status: DC

## 2024-05-02 MED ORDER — ACETAMINOPHEN 650 MG RE SUPP: 650.0000 mg | Freq: Four times a day (QID) | RECTAL | Status: DC | PRN

## 2024-05-02 MED ORDER — AMLODIPINE BESYLATE 5 MG PO TABS
2.5000 mg | ORAL_TABLET | Freq: Every day | ORAL | Status: DC
Start: 2024-05-02 — End: 2024-05-04
  Administered 2024-05-02 – 2024-05-04 (×3): 2.5 mg via ORAL
  Filled 2024-05-02 (×3): qty 1

## 2024-05-02 MED ORDER — FLUTICASONE FUROATE-VILANTEROL 200-25 MCG/ACT IN AEPB
1.0000 | INHALATION_SPRAY | Freq: Every day | RESPIRATORY_TRACT | Status: DC
  Administered 2024-05-02 – 2024-05-04 (×2): 1 via RESPIRATORY_TRACT
  Filled 2024-05-02 (×2): qty 28

## 2024-05-02 NOTE — Assessment & Plan Note (Signed)
Not in acute exacerbation DuoNebs every 6 hours as needed for wheezing and shortness of breath

## 2024-05-02 NOTE — ED Provider Notes (Signed)
 [facility] Provider Note    Event Date/Time   First MD Initiated Contact with Patient 05/02/24 1211     (approximate)   History   Chest Pain   HPI  Brenda Tucker is a 89 y.o. female past medical history significant for hypertension, hyperlipidemia, aortic valve stenosis, who presents to the emergency department with chest pain and not feeling well.  States that she was having chest pain with nausea and dizziness and feeling very lightheaded.  Was given aspirin and nitroglycerin with EMS and states since arriving to the emergency department she is feeling better.  Denies any chest pain at this time.  Describes her chest pain as a dull nonradiating pain that just did not feel well.  Denies any sharp stabbing pain.  No significant shortness of breath or diaphoresis.  Denies any recent similar episodes.      Physical Exam   Triage Vital Signs: ED Triage Vitals  Encounter Vitals Group     BP 05/02/24 1043 (!) 151/81     Girls Systolic BP Percentile --      Girls Diastolic BP Percentile --      Boys Systolic BP Percentile --      Boys Diastolic BP Percentile --      Pulse Rate 05/02/24 1043 85     Resp 05/02/24 1043 17     Temp 05/02/24 1043 98.2 F (36.8 C)     Temp Source 05/02/24 1043 Oral     SpO2 05/02/24 1043 96 %     Weight 05/02/24 1044 115 lb (52.2 kg)     Height 05/02/24 1044 4' 8 (1.422 m)     Head Circumference --      Peak Flow --      Pain Score 05/02/24 1044 0     Pain Loc --      Pain Education --      Exclude from Growth Chart --     Most recent vital signs: Vitals:   05/02/24 1043  BP: (!) 151/81  Pulse: 85  Resp: 17  Temp: 98.2 F (36.8 C)  SpO2: 96%    Physical Exam Constitutional:      Appearance: She is well-developed.  HENT:     Head: Atraumatic.  Eyes:     Conjunctiva/sclera: Conjunctivae normal.  Cardiovascular:     Rate and Rhythm: Regular rhythm.     Heart sounds: Normal heart sounds.  Pulmonary:      Effort: No respiratory distress.  Abdominal:     General: There is no distension.  Musculoskeletal:        General: Normal range of motion.     Cervical back: Normal range of motion.  Skin:    General: Skin is warm.  Neurological:     Mental Status: She is alert. Mental status is at baseline.     IMPRESSION / MDM / ASSESSMENT AND PLAN / ED COURSE  I reviewed the triage vital signs and the nursing notes.  Differential diagnosis including ACS, CHF exacerbation, pneumonia, electrolyte abnormality, dehydration  Notified with the patient was out in triage that her troponin came back elevated at 61.  Placed in a room.  Placed on cardiac telemetry.  EKG  I, Clotilda Punter, the attending physician, personally viewed and interpreted this ECG.  EKG showed right bundle branch block.  PVC.  Nonspecific ST changes.  No significant change when compared to prior EKG.  Negative for Sgarbossa's criteria.  No tachycardic or bradycardic  dysrhythmias while on cardiac telemetry.  RADIOLOGY I independently reviewed imaging, my interpretation of imaging: Chest x-ray with no findings of pneumonia no signs of pulmonary edema.  LABS (all labs ordered are listed, but only abnormal results are displayed) Labs interpreted as -    Labs Reviewed  BASIC METABOLIC PANEL WITH GFR - Abnormal; Notable for the following components:      Result Value   Sodium 132 (*)    Chloride 94 (*)    Glucose, Bld 163 (*)    Creatinine, Ser 1.45 (*)    GFR, Estimated 35 (*)    All other components within normal limits  CBC - Abnormal; Notable for the following components:   RBC 3.55 (*)    Hemoglobin 11.5 (*)    HCT 34.7 (*)    All other components within normal limits  TROPONIN I (HIGH SENSITIVITY) - Abnormal; Notable for the following components:   Troponin I (High Sensitivity) 61 (*)    All other components within normal limits  TROPONIN I (HIGH SENSITIVITY) - Abnormal; Notable for the following components:    Troponin I (High Sensitivity) 101 (*)    All other components within normal limits  APTT  PROTIME-INR  HEPARIN LEVEL (UNFRACTIONATED)  URINALYSIS, COMPLETE (UACMP) WITH MICROSCOPIC     MDM  Patient's troponin elevated at 61 and repeat troponin continued to be elevated at 101.  Creatinine appears to be at her baseline.  Mild hyponatremia.  Mild anemia but does not meet criteria for blood transfusion and no signs or symptoms of a GI bleed.  Currently chest pain-free.  Concern for possible ACS, was given aspirin and nitroglycerin prior to arrival.  Started on heparin.  Consulted hospitalist for admission for NSTEMI     PROCEDURES:  Critical Care performed: yes  .Critical Care  Performed by: Suzanne Kirsch, MD Authorized by: Suzanne Kirsch, MD   Critical care provider statement:    Critical care time (minutes):  30   Critical care time was exclusive of:  Separately billable procedures and treating other patients   Critical care was necessary to treat or prevent imminent or life-threatening deterioration of the following conditions:  Cardiac failure   Critical care was time spent personally by me on the following activities:  Development of treatment plan with patient or surrogate, discussions with consultants, evaluation of patient's response to treatment, examination of patient, ordering and review of laboratory studies, ordering and review of radiographic studies, ordering and performing treatments and interventions, pulse oximetry, re-evaluation of patient's condition and review of old charts   Care discussed with: admitting provider     Patient's presentation is most consistent with acute presentation with potential threat to life or bodily function.   MEDICATIONS ORDERED IN ED: Medications  heparin ADULT infusion 100 units/mL (25000 units/250mL) (550 Units/hr Intravenous New Bag/Given 05/02/24 1415)  atorvastatin  (LIPITOR) tablet 10 mg (has no administration in time range)   amLODipine  (NORVASC ) tablet 2.5 mg (has no administration in time range)  carvedilol  (COREG ) tablet 12.5 mg (has no administration in time range)  levothyroxine  (SYNTHROID ) tablet 50 mcg (has no administration in time range)  cyanocobalamin (VITAMIN B12) tablet 1,000 mcg (has no administration in time range)  acetaminophen  (TYLENOL ) tablet 650 mg (has no administration in time range)    Or  acetaminophen  (TYLENOL ) suppository 650 mg (has no administration in time range)  ondansetron  (ZOFRAN ) tablet 4 mg (has no administration in time range)    Or  ondansetron  (ZOFRAN ) injection 4 mg (has no  administration in time range)  hydrALAZINE  (APRESOLINE ) injection 5 mg (has no administration in time range)  ipratropium-albuterol  (DUONEB) 0.5-2.5 (3) MG/3ML nebulizer solution 3 mL (has no administration in time range)  melatonin tablet 5 mg (has no administration in time range)  heparin bolus via infusion 2,800 Units (2,800 Units Intravenous Bolus from Bag 05/02/24 1415)    FINAL CLINICAL IMPRESSION(S) / ED DIAGNOSES   Final diagnoses:  NSTEMI (non-ST elevated myocardial infarction) (HCC)     Rx / DC Orders   ED Discharge Orders     None        Note:  This document was prepared using Dragon voice recognition software and Neaves include unintentional dictation errors.   Suzanne Kirsch, MD 05/02/24 [phone]

## 2024-05-02 NOTE — Assessment & Plan Note (Signed)
 Check a UA

## 2024-05-02 NOTE — Consult Note (Signed)
 Cardiology Consultation   Patient ID: Brenda Tucker MRN: [id number]; DOB: 1934/06/22  Admit date: 05/02/2024 Date of Consult: 05/02/2024  PCP:  Brenda Tinnie POUR, PA-C   Brenda Tucker Providers Cardiologist:  Brenda Cave, MD        Patient Profile: Brenda Tucker is a 89 y.o. female with a hx of paroxysmal atrial fibrillation (07/08/2020) in the setting of Lexiscan  administration, diastolic dysfunction, mild to moderate aortic valve stenosis, CKD stage IIIb, carotid artery stenosis, hypertension, hyperlipidemia, COPD without prior tobacco use, who is being seen 05/02/2024 for the evaluation of NSTEMI at the request of Dr. Sherre.  History of Present Illness: Brenda Tucker was first evaluated in our clinic 05/21/2020 for lower extremity edema, shortness of breath and RBBB.  Echocardiogram at that time showed normal LV function, moderate aortic annular calcification, mildly calcified aortic valve leaflets and borderline aortic stenosis.  Upon follow-up she reported continued exertional dyspnea with associated chest tightness.  She presented for Lexiscan  in January 2022.  During administration of Lexiscan  agent during the stress portion of the study she developed new onset atrial fibrillation with RVR with ventricular rates in the 130s.  She spontaneously converted to sinus rhythm 26 minutes into recovery.  Primary cardiologist was contacted recommend different anticoagulation given A-fib was noted with the administration of Lexiscan .  She underwent 14-day ZIO monitoring for further evaluations which did not demonstrate A-fib or other arrhythmias.  Upon office follow-up patient asked if she experienced palpitations while in A-fib but she denied frank palpitations reported chest tightness and increased dyspnea during episode.  Carotid artery stenosis on the last ultrasound completed in 07/2020 revealed <50% stenosis with moderate plaque formation bilaterally.  Echocardiogram December 2023 showed  moderate aortic stenosis with a mean gradient of 14.7 mmHg.  LVEF 60 to 65%, no RWMA, G1 DD, normal RV size/function.  Mild to moderate MR, mild AI.  She was last seen in clinic 12/12/2023 reporting that she had been doing well from a cardiac perspective.  Blood pressure had been stable in the 130s to 150s.  It was slightly elevated at her appointment.  She was scheduled for an updated echocardiogram.  There were no medication changes that were made at that time.  Presented to the [facility] emergency department from home with reports of dull chest pain that was nonradiating.  EMS administered 2 sprays of nitroglycerin and gave her 324 mg of aspirin.  She denied any further chest pain once receiving nitro.  Vital signs on arrival reveal blood pressure 171/96, heart rate of 80.  Patient stated that she woke up with the chest pain on the left side this morning.  Repeat vitals when she arrived to the emergency department for blood pressure 151/81, pulse of 85, respirations 17, temperature 98.2.  Labs revealed sodium 132, chloride 94, blood glucose 163, serum creatinine 1.45, GFR 35, hemoglobin 11.5, high-sensitivity troponin of 61 and 101.  Chest x-ray revealed no acute cardiopulmonary findings, aortic atherosclerosis, and levoconvex lower thoracic scoliosis and thoracolumbar degenerative changes.  Cardiology was consulted to assist with management of chest pain and elevated high-sensitivity troponins.   Past Medical History:  Diagnosis Date   Arthritis    Asthma    Cancer (HCC)    skin, colon polyp   Chronic kidney disease    Complication of anesthesia    asthma attack per pt   COPD (chronic obstructive pulmonary disease) (HCC)    Dyspnea    Hypertension    Hypothyroidism  Past Surgical History:  Procedure Laterality Date   ABDOMINAL HYSTERECTOMY     APPENDECTOMY     CHOLECYSTECTOMY N/A 03/12/2020   Procedure: LAPAROSCOPIC CHOLECYSTECTOMY;  Surgeon: Marolyn Nest, MD;  Location: [facility] ORS;   Service: General;  Laterality: N/A;   COLONOSCOPY WITH PROPOFOL  N/A 07/09/2015   Procedure: COLONOSCOPY WITH PROPOFOL ;  Surgeon: Lamar ONEIDA Holmes, MD;  Location: [facility] ENDOSCOPY;  Service: Endoscopy;  Laterality: N/A;     Home Medications:  Prior to Admission medications   Medication Sig Start Date End Date Taking? Authorizing Provider  acetaminophen  (TYLENOL ) 500 MG tablet Take 500 mg by mouth every 6 (six) hours as needed for moderate pain or headache.    [provider]  albuterol  (VENTOLIN  HFA) 108 (90 Base) MCG/ACT inhaler Inhale 2 puffs into the lungs every 6 (six) hours as needed for wheezing or shortness of breath. 08/16/23   Abernathy, Alyssa, NP  amLODipine  (NORVASC ) 2.5 MG tablet Take 1 tablet (2.5 mg total) by mouth daily. 12/17/23   McDonough, Lauren K, PA-C  atorvastatin  (LIPITOR) 10 MG tablet TAKE 1 TABLET BY MOUTH EVERY OTHER DAY 03/04/24   McDonough, Lauren K, PA-C  budesonide -formoterol  (SYMBICORT ) 160-4.5 MCG/ACT inhaler Inhale 2 puffs into the lungs 2 (two) times daily. 08/16/23   Liana Fish, NP  calcium  carbonate (OSCAL) 1500 (600 Ca) MG TABS tablet Take 600 mg of elemental calcium  by mouth daily.    [provider]  carvedilol  (COREG ) 25 MG tablet TAKE 1/2 TABLET (12.5 MG) BY MOUTH TWICE DAILY WITH A MEAL 03/04/24   McDonough, Lauren K, PA-C  Cholecalciferol (VITAMIN D ) 50 MCG (2000 UT) tablet Take 2,000 Units by mouth daily.    [provider]  furosemide  (LASIX ) 20 MG tablet TAKE 1 TABLET (20MG ) BY MOUTH DAILY 10/22/23   Liana Fish, NP  hydrALAZINE  (APRESOLINE ) 10 MG tablet TAKE 1 TABLET BY MOUTH EVERY DAY AS NEEDED 03/17/24   McDonough, Lauren K, PA-C  levothyroxine  (SYNTHROID ) 50 MCG tablet TAKE 1 TABLET (50 MCG) BY MOUTH DAILY BEFORE BREAKFAST 07/19/23   McDonough, Lauren K, PA-C  multivitamin-lutein (OCUVITE-LUTEIN) CAPS capsule Take 1 capsule by mouth daily.    [provider]  OXYGEN  Inhale into the lungs. 2 litre AHP    [provider]  ROCKLATAN 0.02-0.005 % SOLN Apply 1 drop to eye at bedtime. 06/08/23   [provider]  vitamin B-12 (CYANOCOBALAMIN) 1000 MCG tablet Take 1,000 mcg by mouth daily.    [provider]  fluticasone -salmeterol (ADVAIR DISKUS) 250-50 MCG/ACT AEPB INHALE 1 PUFF INTO THE LUNGS TWICE DAILY 03/28/21 08/08/21  Khan, Saadat A, MD    Scheduled Meds:  amLODipine   2.5 mg Oral Daily   atorvastatin   10 mg Oral QODAY   carvedilol   12.5 mg Oral BID WC   [START ON 05/03/2024] cyanocobalamin  1,000 mcg Oral Daily   [START ON 05/03/2024] levothyroxine   50 mcg Oral Q0600   Continuous Infusions:  heparin 550 Units/hr (05/02/24 1415)   PRN Meds: acetaminophen  **OR** acetaminophen , hydrALAZINE , ipratropium-albuterol , melatonin, ondansetron  **OR** ondansetron  (ZOFRAN ) IV  Allergies:    Allergies  Allergen Reactions   Theodrenaline     Shaky, nervous    Social History:   Social History   Socioeconomic History   Marital status: Widowed    Spouse name: Not on file   Number of children: Not on file   Years of education: Not on file   Highest education level: Not on file  Occupational History   Not on file  Tobacco Use   Smoking status: Never   Smokeless tobacco: Never   Tobacco comments:    second hand smoke  Vaping Use   Vaping status: Never Used  Substance and Sexual Activity   Alcohol use: No   Drug use: No   Sexual activity: Not Currently  Other Topics Concern   Not on file  Social History Narrative   Not on file   Social Drivers of Health   Financial Resource Strain: Low Risk  (12/15/2020)   Overall Financial Resource Strain (CARDIA)    Difficulty of Paying Living Expenses: Not very hard  Food Insecurity: Not on file  Transportation Needs: Not on file  Physical Activity: Not on file  Stress: Not on file  Social Connections: Not on file  Intimate Partner Violence: Not on file    Family History:    Family History  Problem Relation Age of Onset    Heart attack Father    Early death Sister        MVA   Stomach cancer Brother    Cancer Brother        shoulder   Lung cancer Brother    Cancer Sister    Cancer Sister    Early death Sister        husband killed her   Breast cancer Neg Hx      ROS:  Please see the history of present illness.  Review of Systems  Constitutional:  Positive for malaise/fatigue.  Eyes:        Loss of eye sight  Respiratory:  Positive for shortness of breath.        SOB that is unchanged from baseline  Cardiovascular:  Positive for chest pain and leg swelling.  Neurological:  Positive for weakness.  Psychiatric/Behavioral:  Positive for memory loss.     All other ROS reviewed and negative.     Physical Exam/Data: Vitals:   05/02/24 1043 05/02/24 1044  BP: (!) 151/81   Pulse: 85   Resp: 17   Temp: 98.2 F (36.8 C)   TempSrc: Oral   SpO2: 96%   Weight:  52.2 kg  Height:  4' 8 (1.422 m)   No intake or output data in the 24 hours ending 05/02/24 1534    05/02/2024   10:44 AM 02/19/2024    3:28 PM 01/14/2024   11:12 AM  Last 3 Weights  Weight (lbs) 115 lb 115 lb 115 lb 6.4 oz  Weight (kg) 52.164 kg 52.164 kg 52.345 kg     Body mass index is 25.78 kg/m.  General: Frail chronically ill-appearing in no acute distress HEENT: normal Neck: no JVD Vascular: No carotid bruits; Distal pulses 2+ bilaterally Cardiac:  normal S1, S2; RRR;  II/VI systolic murmur RUSB without rubs or gallops Lungs:  clear to auscultation bilaterally, no wheezing, rhonchi or rales  Abd: soft, nontender, no hepatomegaly  Ext: 1+ edema to BLE Musculoskeletal:  No deformities, BUE and BLE strength normal and equal Skin: warm and dry  Neuro:  CNs 2-12 intact, no focal abnormalities noted Psych:  Normal affect   EKG:  The EKG was personally reviewed and demonstrates: Sinus rhythm with rate 83, chronic right bundle branch block, left anterior fascicular block, bifascicular block noted Telemetry:  Telemetry was  personally reviewed and demonstrates: Sinus rhythm rate of 70-80 with chronic right bundle branch block  Relevant CV Studies: 2d echo 06/20/2022 1. Left ventricular ejection fraction, by estimation, is 60 to 65%. The  left ventricle  has normal function. The left ventricle has no regional  wall motion abnormalities. Left ventricular diastolic parameters are  consistent with Grade I diastolic  dysfunction (impaired relaxation).   2. Right ventricular systolic function is normal. The right ventricular  size is normal.   3. The mitral valve is normal in structure. Mild to moderate mitral valve  regurgitation. No evidence of mitral stenosis.   4. The aortic valve has an indeterminant number of cusps. There is  moderate calcification of the aortic valve. Aortic valve regurgitation is  mild. Moderate aortic valve stenosis by Aortic valve area, by VTI measures  0.95 cm. Mild to moderate stenosis  by Aortic valve mean gradient measures 14.7 mmHg. Aortic valve Vmax  measures 2.70 m/s.   5. The inferior vena cava is normal in size with greater than 50%  respiratory variability, suggesting right atrial pressure of 3 mmHg.   Event Monitor (zio) 07/30/2020 Patient had a min HR of 52 bpm, max HR of 162 bpm, and avg HR of 62 bpm. Predominant underlying rhythm was Sinus Rhythm.  No evidence of atrial fibrillation or atrial flutter noted on cardiac monitor.  Lexiscan  MPI 07/08/2020 Pharmacological myocardial perfusion imaging study with no significant  ischemia Normal wall motion, EF estimated at 88% No EKG changes concerning for ischemia at peak stress or in recovery. Patient developed atrial fibrillation with RVR in recovery, rate 140 bpm with associated chest tightness.  Converted back to normal sinus rhythm at 25 min in recovery without intervention CT attenuation correction images with moderate diffuse aortic atherosclerosis, moderate coronary calcification of the LAD and RCA Low risk scan   2d  echo 06/16/2020 1. Left ventricular ejection fraction, by estimation, is 55 to 60%. The  left ventricle has normal function. The left ventricle has no regional  wall motion abnormalities. Left ventricular diastolic parameters are  consistent with Grade II diastolic  dysfunction (pseudonormalization).   2. Right ventricular systolic function is normal. The right ventricular  size is normal.   3. The mitral valve is normal in structure. Trivial mitral valve  regurgitation. No evidence of mitral stenosis.   4. Aortic valve DVI = 0.33, mean Gradient 18, PG 29.. The aortic valve is  calcified. Aortic valve regurgitation is not visualized. Mild to moderate  aortic valve stenosis.   5. The inferior vena cava is normal in size with <50% respiratory  variability, suggesting right atrial pressure of 8 mmHg.   Laboratory Data: High Sensitivity Troponin:   Recent Labs  Lab 05/02/24 1047 05/02/24 1224  TROPONINIHS 61* 101*     Chemistry Recent Labs  Lab 05/02/24 1047  NA 132*  K 4.4  CL 94*  CO2 27  GLUCOSE 163*  BUN 16  CREATININE 1.45*  CALCIUM  8.9  GFRNONAA 35*  ANIONGAP 11    No results for input(s): PROT, ALBUMIN, AST, ALT, ALKPHOS, BILITOT in the last 168 hours. Lipids No results for input(s): CHOL, TRIG, HDL, LABVLDL, LDLCALC, CHOLHDL in the last 168 hours.  Hematology Recent Labs  Lab 05/02/24 1047  WBC 7.4  RBC 3.55*  HGB 11.5*  HCT 34.7*  MCV 97.7  MCH 32.4  MCHC 33.1  RDW 12.1  PLT 284   Thyroid  No results for input(s): TSH, FREET4 in the last 168 hours.  BNPNo results for input(s): BNP, PROBNP in the last 168 hours.  DDimer No results for input(s): DDIMER in the last 168 hours.  Radiology/Studies:  DG Chest 2 View Result Date: 05/02/2024 EXAM: 2 VIEW(S) XRAY  OF THE CHEST 05/02/2024 11:06:00 AM COMPARISON: 06/14/2023 CLINICAL HISTORY: Chest pain. FINDINGS: LUNGS AND PLEURA: No focal pulmonary opacity. No pulmonary edema. No  pleural effusion. No pneumothorax. HEART AND MEDIASTINUM: Aortic atherosclerosis. BONES AND SOFT TISSUES: Levoconvex lower thoracic scoliosis and degenerative changes. IMPRESSION: 1. No acute cardiopulmonary findings. 2. Aortic atherosclerosis. 3. Levoconvex lower thoracic scoliosis and thoracolumbar degenerative changes. Electronically signed by: Ryan Salvage MD 05/02/2024 11:55 AM EDT RP Workstation: [id number]     Assessment and Plan: Elevated high-sensitivity troponin/chest pain - After waking this morning had left-sided chest pain that radiated into her left shoulder with associated dizziness and lightheadedness, EMS was activated by her daughter - EMS gave 2 sprays of nitroglycerin - Has remained chest pain-free since that time -EKG nonacute -High-sensitivity troponins trended to 61 and 101 -Currently on heparin infusion to be continued for 48 hours - Discussed potentially doing left heart catheterization on Monday.  Patient and family would like to discuss options prior to if not we will continue with medical management - Echocardiogram ordered and pending with further recommendations to follow - Continue on telemetry monitoring - EKG as needed for pain or changes  Hypertension -Blood pressure 151/81 -Continued on amlodipine  2.5 mg daily, carvedilol  12.5 mg twice daily - Vital signs per unit protocol  Hyperlipidemia -LDL 58 -Continue on atorvastatin  10 mg daily  CKD stage IIIb - Serum creatinine 1.45 -Serum creatinine baseline 1.35-1.45 -Monitor urine output -Monitor/trend/replete electrolytes as needed -Daily BMP -Avoid nephrotoxic agents were able  Aortic valve stenosis -2/6 systolic murmur noted on exam - Reports baseline chronic shortness of breath related to COPD - Echocardiogram ordered and pending  COPD -Not in acute exacerbation -Continued on nebulizers every 6 hours as needed   Risk Assessment/Risk Scores:    TIMI Risk Score for Unstable Angina or  Non-ST Elevation MI:   The patient's TIMI risk score is 4, which indicates a 20% risk of all cause mortality, new or recurrent myocardial infarction or need for urgent revascularization in the next 14 days.         For questions or updates, please contact King Tucker Please consult [email or web address] for contact info under      Signed, Keir Viernes, NP  05/02/2024 3:34 PM

## 2024-05-02 NOTE — Assessment & Plan Note (Signed)
 Home atorvastatin  10 mg every other day resumed

## 2024-05-02 NOTE — Assessment & Plan Note (Signed)
 Home levothyroxine 50 mcg daily resumed

## 2024-05-02 NOTE — Assessment & Plan Note (Signed)
 At baseline

## 2024-05-02 NOTE — ED Notes (Signed)
 Mumma, MD, made aware of troponin 101

## 2024-05-02 NOTE — Assessment & Plan Note (Addendum)
 Cardiology has been consulted via secure chat and epic order to [facility] cardiology Complete echo ordered Continue with heparin GGT per pharmacy Admit to telemetry, inpatient

## 2024-05-02 NOTE — Consult Note (Signed)
 PHARMACY - ANTICOAGULATION CONSULT NOTE  Pharmacy Consult for heparin infusion Indication: chest pain/ACS  Allergies  Allergen Reactions   Theodrenaline     Shaky, nervous    Patient Measurements: Height: 4' 8 (142.2 cm) Weight: 52.2 kg (115 lb) IBW/kg (Calculated) : 36.3 HEPARIN DW (KG): 47.4  Vital Signs: Temp: 98.2 F (36.8 C) (10/31 1043) Temp Source: Oral (10/31 1043) BP: 151/81 (10/31 1043) Pulse Rate: 85 (10/31 1043)  Labs: Recent Labs    05/02/24 1047 05/02/24 1224  HGB 11.5*  --   HCT 34.7*  --   PLT 284  --   CREATININE 1.45*  --   TROPONINIHS 61* 101*    Estimated Creatinine Clearance: 18.1 mL/min (A) (by C-G formula based on SCr of 1.45 mg/dL (H)).   Medical History: Past Medical History:  Diagnosis Date   Arthritis    Asthma    Cancer (HCC)    skin, colon polyp   Chronic kidney disease    Complication of anesthesia    asthma attack per pt   COPD (chronic obstructive pulmonary disease) (HCC)    Dyspnea    Hypertension    Hypothyroidism     Medications:  No home anticoagulants per pharmacist review  Assessment: 89 yo female presented to ED with complaint of chest pain.  In ED patient found to have elevated troponin.  Pharmacy consulted to initiate heparin infusion.  Baseline aPTT and PT/INR ordered  Goal of Therapy:  Heparin level 0.3-0.7 units/ml Monitor platelets by anticoagulation protocol: Yes   Plan:  Give 2800 units bolus x 1 Start heparin infusion at 550 units/hr Check anti-Xa level in 8 hours and daily while on heparin Continue to monitor H&H and platelets  Kayla JULIANNA Blew, PharmD 05/02/2024,1:37 PM

## 2024-05-02 NOTE — ED Triage Notes (Signed)
 Arrived by [facility] from home. Reports dull chest pain. Non radiating.   EMS administered: 2 spray nitroglycerin 324 aspirin  Denies chest pain once receiving nitroglycerin   EMS vitals: 171/96 b/p 80HR 140CBG  18G Left AC

## 2024-05-02 NOTE — H&P (Signed)
 History and Physical   Brenda Tucker FMW:[id number] DOB: 03/01/1934 DOA: 05/02/2024  PCP: Kristina Tinnie POUR, PA-C  Outpatient Specialists: Dr. Daleen Bandy, [facility] cardiology Patient coming from: Home via EMS  I have personally briefly reviewed patient's old medical records in [facility] EMR.  Chief Concern: Chest pain  HPI: [name] is an 89 year old female with history of GERD, hypothyroid, hypertension, hyperlipidemia, COPD, who presents ED for chief concerns of chest pain.  Per ED documentation, patient received 2 sprays of nitroglycerin, aspirin 324 mg p.o. per EMS.  Vitals in the ED showed tof 98.2, rr17, hr 85, blood pressure 151/81, currently 221/66, SpO2 100% on room air.  Serum sodium is 132, potassium 4.4, chloride 94, bicarb 27, BUN of 16, serum creatinine 1.45, eGFR 35, nonfasting blood glucose 163, WBC 7.4, hemoglobin 11.5, platelets of 284.  HS troponin is 61 and on repeat is 101.  ED treatment: Heparin per pharmacy ---------------------------------------- At bedside, patient is able to tell me her first and last name, age, current location, and current year.  She woke up with the chest pressure and tightness this morning. She denies trauma to her person. She denies shortness of breath. She reprots the chest pain went away after the spray medications that EMS gave.   She endorses buring and pain with urination for about 1-1.5 weeks.   Social history: She lives at home on her own.   ROS: Constitutional: no weight change, no fever ENT/Mouth: no sore throat, no rhinorrhea Eyes: no eye pain, no vision changes Cardiovascular: no chest pain, no dyspnea,  no edema, no palpitations Respiratory: no cough, no sputum, no wheezing Gastrointestinal: no nausea, no vomiting, no diarrhea, no constipation Genitourinary: no urinary incontinence, no dysuria, no hematuria Musculoskeletal: no arthralgias, no myalgias Skin: no skin lesions, no pruritus, Neuro: + weakness, no loss  of consciousness, no syncope Psych: no anxiety, no depression, + decrease appetite Heme/Lymph: no bruising, no bleeding  ED Course: Discussed with EDP, patient requiring hospitalization for chief concerns of NSTEMI.  Assessment/Plan  Principal Problem:   NSTEMI (non-ST elevated myocardial infarction) (HCC) Active Problems:   Chronic obstructive pulmonary disease (HCC)   Hypertension   GERD (gastroesophageal reflux disease)   Hyperlipidemia   CKD stage 3b, GFR 30-44 ml/min (HCC)   Hypothyroidism   Assessment and Plan:  * NSTEMI (non-ST elevated myocardial infarction) [facility]) Cardiology has been consulted via secure chat and epic order to [facility] cardiology Complete echo ordered Continue with heparin GGT per pharmacy Admit to telemetry, inpatient  Hypertension Home amlodipine  2.5 mg daily, Coreg  12.5 mg p.o. twice daily with meals resumed Hydralazine  5 mg IV every 6 hours as needed for SBP greater 175, 5 days ordered  Chronic obstructive pulmonary disease (HCC) Not in acute exacerbation DuoNebs every 6 hours as needed for wheezing and shortness of breath  CKD stage 3b, GFR 30-44 ml/min (HCC) At baseline  Hyperlipidemia Home atorvastatin  10 mg every other day resumed  Hypothyroidism Home levothyroxine  50 mcg daily resumed  Chart reviewed.   DVT prophylaxis: Heparin GGT Code Status: Patient would like to be allowed to pass naturally if she should have a cardiac arrest.  DNR/DNI Diet: Heart healthy diet Family Communication: Updated daughter Sherrye) and son-in-law at bedside with patient's permission Disposition Plan: Pending clinical course Consults called: Cardiology Admission status: Telemetry, inpatient  Past Medical History:  Diagnosis Date   Arthritis    Asthma    Cancer (HCC)    skin, colon polyp   Chronic kidney  disease    Complication of anesthesia    asthma attack per pt   COPD (chronic obstructive pulmonary disease) (HCC)    Dyspnea    Hypertension     Hypothyroidism    Past Surgical History:  Procedure Laterality Date   ABDOMINAL HYSTERECTOMY     APPENDECTOMY     CHOLECYSTECTOMY N/A 03/12/2020   Procedure: LAPAROSCOPIC CHOLECYSTECTOMY;  Surgeon: Marolyn Nest, MD;  Location: [facility] ORS;  Service: General;  Laterality: N/A;   COLONOSCOPY WITH PROPOFOL  N/A 07/09/2015   Procedure: COLONOSCOPY WITH PROPOFOL ;  Surgeon: Lamar ONEIDA Holmes, MD;  Location: [facility] ENDOSCOPY;  Service: Endoscopy;  Laterality: N/A;   Social History:  reports that she has never smoked. She has never used smokeless tobacco. She reports that she does not drink alcohol and does not use drugs.  Allergies  Allergen Reactions   Theodrenaline     Shaky, nervous   Family History  Problem Relation Age of Onset   Heart attack Father    Early death Sister        MVA   Stomach cancer Brother    Cancer Brother        shoulder   Lung cancer Brother    Cancer Sister    Cancer Sister    Early death Sister        husband killed her   Breast cancer Neg Hx    Family history: Family history reviewed and not pertinent.  Prior to Admission medications   Medication Sig Start Date End Date Taking? Authorizing Provider  acetaminophen  (TYLENOL ) 500 MG tablet Take 500 mg by mouth every 6 (six) hours as needed for moderate pain or headache.    [provider]  albuterol  (VENTOLIN  HFA) 108 (90 Base) MCG/ACT inhaler Inhale 2 puffs into the lungs every 6 (six) hours as needed for wheezing or shortness of breath. 08/16/23   Abernathy, Alyssa, NP  amLODipine  (NORVASC ) 2.5 MG tablet Take 1 tablet (2.5 mg total) by mouth daily. 12/17/23   McDonough, Lauren K, PA-C  atorvastatin  (LIPITOR) 10 MG tablet TAKE 1 TABLET BY MOUTH EVERY OTHER DAY 03/04/24   McDonough, Lauren K, PA-C  budesonide -formoterol  (SYMBICORT ) 160-4.5 MCG/ACT inhaler Inhale 2 puffs into the lungs 2 (two) times daily. 08/16/23   Abernathy, Alyssa, NP  calcium  carbonate (OSCAL) 1500 (600 Ca) MG TABS tablet Take 600 mg  of elemental calcium  by mouth daily.    [provider]  carvedilol  (COREG ) 25 MG tablet TAKE 1/2 TABLET (12.5 MG) BY MOUTH TWICE DAILY WITH A MEAL 03/04/24   McDonough, Lauren K, PA-C  Cholecalciferol (VITAMIN D ) 50 MCG (2000 UT) tablet Take 2,000 Units by mouth daily.    [provider]  furosemide  (LASIX ) 20 MG tablet TAKE 1 TABLET (20MG ) BY MOUTH DAILY 10/22/23   Liana Fish, NP  hydrALAZINE  (APRESOLINE ) 10 MG tablet TAKE 1 TABLET BY MOUTH EVERY DAY AS NEEDED 03/17/24   McDonough, Lauren K, PA-C  levothyroxine  (SYNTHROID ) 50 MCG tablet TAKE 1 TABLET (50 MCG) BY MOUTH DAILY BEFORE BREAKFAST 07/19/23   McDonough, Lauren K, PA-C  multivitamin-lutein (OCUVITE-LUTEIN) CAPS capsule Take 1 capsule by mouth daily.    [provider]  OXYGEN  Inhale into the lungs. 2 litre AHP    [provider]  ROCKLATAN 0.02-0.005 % SOLN Apply 1 drop to eye at bedtime. 06/08/23   [provider]  vitamin B-12 (CYANOCOBALAMIN) 1000 MCG tablet Take 1,000 mcg by mouth daily.    [provider]  fluticasone -salmeterol (ADVAIR DISKUS)  250-50 MCG/ACT AEPB INHALE 1 PUFF INTO THE LUNGS TWICE DAILY 03/28/21 08/08/21  Fernand Elfreda LABOR, MD    Physical Exam: Vitals:   05/02/24 1043 05/02/24 1044  BP: (!) 151/81   Pulse: 85   Resp: 17   Temp: 98.2 F (36.8 C)   TempSrc: Oral   SpO2: 96%   Weight:  52.2 kg  Height:  4' 8 (1.422 m)   Constitutional: appears age-appropriate, frail, calm Eyes: PERRL, lids and conjunctivae normal ENMT: Mucous membranes are moist. Posterior pharynx clear of any exudate or lesions. Age-appropriate dentition. Hearing appropriate Neck: normal, supple, no masses, no thyromegaly Respiratory: clear to auscultation bilaterally, no wheezing, no crackles. Normal respiratory effort. No accessory muscle use.  Cardiovascular: Regular rate and rhythm, no murmurs / rubs / gallops. No extremity edema. 2+ pedal pulses. No carotid bruits.  Abdomen: no  tenderness, no masses palpated, no hepatosplenomegaly. Bowel sounds positive.  Musculoskeletal: no clubbing / cyanosis. No joint deformity upper and lower extremities. Good ROM, no contractures, no atrophy. Normal muscle tone.  Skin: no rashes, lesions, ulcers. No induration Neurologic: Sensation intact. Strength 5/5 in all 4.  Psychiatric: Normal judgment and insight. Alert and oriented x 3. Normal mood.   EKG: independently reviewed, showing sinus rhythm with rate of 73, QTc 463  Chest x-ray on Admission: I personally reviewed and I agree with radiologist reading as below.  DG Chest 2 View Result Date: 05/02/2024 EXAM: 2 VIEW(S) XRAY OF THE CHEST 05/02/2024 11:06:00 AM COMPARISON: 06/14/2023 CLINICAL HISTORY: Chest pain. FINDINGS: LUNGS AND PLEURA: No focal pulmonary opacity. No pulmonary edema. No pleural effusion. No pneumothorax. HEART AND MEDIASTINUM: Aortic atherosclerosis. BONES AND SOFT TISSUES: Levoconvex lower thoracic scoliosis and degenerative changes. IMPRESSION: 1. No acute cardiopulmonary findings. 2. Aortic atherosclerosis. 3. Levoconvex lower thoracic scoliosis and thoracolumbar degenerative changes. Electronically signed by: Ryan Salvage MD 05/02/2024 11:55 AM EDT RP Workstation: [id number]   Labs on Admission: I have personally reviewed following labs  CBC: Recent Labs  Lab 05/02/24 1047  WBC 7.4  HGB 11.5*  HCT 34.7*  MCV 97.7  PLT 284   Basic Metabolic Panel: Recent Labs  Lab 05/02/24 1047  NA 132*  K 4.4  CL 94*  CO2 27  GLUCOSE 163*  BUN 16  CREATININE 1.45*  CALCIUM  8.9   GFR: Estimated Creatinine Clearance: 18.1 mL/min (A) (by C-G formula based on SCr of 1.45 mg/dL (H)).  Coagulation Profile: Recent Labs  Lab 05/02/24 1340  INR 1.0   Urine analysis:    Component Value Date/Time   APPEARANCEUR Clear 01/11/2023 1549   GLUCOSEU Negative 01/11/2023 1549   BILIRUBINUR Negative 01/11/2023 1549   PROTEINUR Negative 01/11/2023 1549    UROBILINOGEN 0.2 12/19/2018 1056   NITRITE Negative 01/11/2023 1549   LEUKOCYTESUR 2+ (A) 01/11/2023 1549   This document was prepared using Dragon Voice Recognition software and Carcione include unintentional dictation errors.  Dr. Sherre [facility]  If 7PM-7AM, please contact overnight-coverage provider If 7AM-7PM, please contact day attending provider [email or web address]  05/02/2024, 2:17 PM

## 2024-05-02 NOTE — Assessment & Plan Note (Signed)
 Home amlodipine  2.5 mg daily, Coreg  12.5 mg p.o. twice daily with meals resumed Hydralazine  5 mg IV every 6 hours as needed for SBP greater 175, 5 days ordered

## 2024-05-02 NOTE — ED Triage Notes (Signed)
 Patient to ED via [facility] from home for left sided CP. States started when she woke up this AM. Denies pain at this time.  Given 2 spray nitroglycerin and 324 ASA.

## 2024-05-02 NOTE — Hospital Course (Addendum)
 Ms. Jennice Renegar is an 89 year old female with history of GERD, hypothyroid, hypertension, hyperlipidemia, COPD, who presents ED for chief concerns of chest pain.  Per ED documentation, patient received 2 sprays of nitroglycerin, aspirin 324 mg p.o. per EMS.  Vitals in the ED showed tof 98.2, rr17, hr 85, blood pressure 151/81, currently 221/66, SpO2 100% on room air.  Serum sodium is 132, potassium 4.4, chloride 94, bicarb 27, BUN of 16, serum creatinine 1.45, eGFR 35, nonfasting blood glucose 163, WBC 7.4, hemoglobin 11.5, platelets of 284.  HS troponin is 61 and on repeat is 101.  ED treatment: Heparin per pharmacy

## 2024-05-02 NOTE — Consult Note (Signed)
 PHARMACY - ANTICOAGULATION CONSULT NOTE  Pharmacy Consult for heparin infusion Indication: chest pain/ACS  Allergies  Allergen Reactions   Theodrenaline     Shaky, nervous    Patient Measurements: Height: 4' 8 (142.2 cm) Weight: 52.2 kg (115 lb) IBW/kg (Calculated) : 36.3 HEPARIN DW (KG): 47.4  Vital Signs: Temp: 98.3 F (36.8 C) (10/31 1951) BP: 132/39 (10/31 1951) Pulse Rate: 69 (10/31 1951)  Labs: Recent Labs    05/02/24 1047 05/02/24 1224 05/02/24 1340 05/02/24 2231  HGB 11.5*  --   --   --   HCT 34.7*  --   --   --   PLT 284  --   --   --   APTT  --   --  27  --   LABPROT  --   --  14.2  --   INR  --   --  1.0  --   HEPARINUNFRC  --   --   --  0.24*  CREATININE 1.45*  --   --   --   TROPONINIHS 61* 101*  --   --     Estimated Creatinine Clearance: 18.1 mL/min (A) (by C-G formula based on SCr of 1.45 mg/dL (H)).   Medical History: Past Medical History:  Diagnosis Date   Arthritis    Asthma    Cancer (HCC)    skin, colon polyp   Chronic kidney disease    Complication of anesthesia    asthma attack per pt   COPD (chronic obstructive pulmonary disease) (HCC)    Dyspnea    Hypertension    Hypothyroidism     Medications:  No home anticoagulants per pharmacist review  Assessment: 89 yo female presented to ED with complaint of chest pain.  In ED patient found to have elevated troponin.  Pharmacy consulted to initiate heparin infusion.  Baseline aPTT and PT/INR ordered  Goal of Therapy:  Heparin level 0.3-0.7 units/ml Monitor platelets by anticoagulation protocol: Yes  10/31 2231 HL 0.24, subtherapeutic   Plan:  Bolus 700 units x 1 Increase heparin infusion to 650 units/hr Recheck HL in 8 hrs after rate change CBC daily while on heparin  Rankin CANDIE Dills, PharmD, [facility] 05/02/2024 11:33 PM

## 2024-05-03 ENCOUNTER — Inpatient Hospital Stay: Admit: 2024-05-03 | Discharge: 2024-05-03 | Disposition: A | Attending: Internal Medicine

## 2024-05-03 DIAGNOSIS — R079 Chest pain, unspecified: Secondary | ICD-10-CM

## 2024-05-03 DIAGNOSIS — I10 Essential (primary) hypertension: Secondary | ICD-10-CM

## 2024-05-03 DIAGNOSIS — I214 Non-ST elevation (NSTEMI) myocardial infarction: Secondary | ICD-10-CM

## 2024-05-03 DIAGNOSIS — N1832 Chronic kidney disease, stage 3b: Secondary | ICD-10-CM

## 2024-05-03 DIAGNOSIS — I350 Nonrheumatic aortic (valve) stenosis: Secondary | ICD-10-CM | POA: Diagnosis not present

## 2024-05-03 LAB — CBC
HCT: 32.4 % — ABNORMAL LOW (ref 36.0–46.0)
Hemoglobin: 10.6 g/dL — ABNORMAL LOW (ref 12.0–15.0)
MCH: 31.8 pg (ref 26.0–34.0)
MCHC: 32.7 g/dL (ref 30.0–36.0)
MCV: 97.3 fL (ref 80.0–100.0)
Platelets: 284 K/uL (ref 150–400)
RBC: 3.33 MIL/uL — ABNORMAL LOW (ref 3.87–5.11)
RDW: 12.2 % (ref 11.5–15.5)
WBC: 7.4 K/uL (ref 4.0–10.5)
nRBC: 0 % (ref 0.0–0.2)

## 2024-05-03 LAB — BASIC METABOLIC PANEL WITH GFR
Anion gap: 12 (ref 5–15)
BUN: 22 mg/dL (ref 8–23)
CO2: 27 mmol/L (ref 22–32)
Calcium: 9.1 mg/dL (ref 8.9–10.3)
Chloride: 94 mmol/L — ABNORMAL LOW (ref 98–111)
Creatinine, Ser: 1.59 mg/dL — ABNORMAL HIGH (ref 0.44–1.00)
GFR, Estimated: 31 mL/min — ABNORMAL LOW (ref >60)
Glucose, Bld: 105 mg/dL — ABNORMAL HIGH (ref 70–99)
Potassium: 4.6 mmol/L (ref 3.5–5.1)
Sodium: 133 mmol/L — ABNORMAL LOW (ref 135–145)

## 2024-05-03 LAB — LIPID PANEL
Cholesterol: 148 mg/dL (ref 0–200)
HDL: 65 mg/dL (ref >40)
LDL Cholesterol: 70 mg/dL (ref 0–99)
Total CHOL/HDL Ratio: 2.3 ratio
Triglycerides: 66 mg/dL (ref <150)
VLDL: 13 mg/dL (ref 0–40)

## 2024-05-03 LAB — HEPARIN LEVEL (UNFRACTIONATED)
Heparin Unfractionated: 0.29 [IU]/mL — ABNORMAL LOW (ref 0.30–0.70)
Heparin Unfractionated: 0.69 [IU]/mL (ref 0.30–0.70)

## 2024-05-03 LAB — ECHOCARDIOGRAM COMPLETE
AR max vel: 0.61 cm2
AV Area VTI: 0.6 cm2
AV Area mean vel: 0.6 cm2
AV Mean grad: 28.6 mmHg
AV Peak grad: 51.7 mmHg
Ao pk vel: 3.59 m/s
Area-P 1/2: 2.43 cm2
Height: 56 in
P 1/2 time: 534 ms
S' Lateral: 2.5 cm
Weight: 1840 [oz_av]

## 2024-05-03 LAB — HEMOGLOBIN A1C
Hgb A1c MFr Bld: 5.9 % — ABNORMAL HIGH (ref 4.8–5.6)
Mean Plasma Glucose: 122.63 mg/dL

## 2024-05-03 MED ORDER — HEPARIN BOLUS VIA INFUSION
700.0000 [IU] | Freq: Once | INTRAVENOUS | Status: AC
Start: 2024-05-03 — End: 2024-05-03
  Administered 2024-05-03: 700 [IU] via INTRAVENOUS
  Filled 2024-05-03: qty 700

## 2024-05-03 NOTE — Progress Notes (Signed)
  Echocardiogram 2D Echocardiogram has been performed.  Brenda Tucker 05/03/2024, 11:09 AM

## 2024-05-03 NOTE — Progress Notes (Signed)
 Rounding Note   Patient Name: Brenda Tucker Date of Encounter: 05/03/2024  [facility] Cardiologist: Redell Cave, MD   Subjective Patient seen on a.m. rounds.  Denies any chest pain or shortness of breath.  Overnight was uneventful.  Echocardiogram tech at bedside to complete echo this morning.  Scheduled Meds:  amLODipine   2.5 mg Oral Daily   atorvastatin   10 mg Oral QODAY   carvedilol   12.5 mg Oral BID WC   cholecalciferol  2,000 Units Oral Daily   cyanocobalamin  1,000 mcg Oral Daily   fluticasone  furoate-vilanterol  1 puff Inhalation Daily   levothyroxine   50 mcg Oral Q0600   multivitamin  1 tablet Oral Daily   Netarsudil-Latanoprost  1 drop Both Eyes QHS   Continuous Infusions:  heparin 750 Units/hr (05/03/24 0957)   PRN Meds: acetaminophen  **OR** acetaminophen , hydrALAZINE , ipratropium-albuterol , melatonin, ondansetron  **OR** ondansetron  (ZOFRAN ) IV   Vital Signs  Vitals:   05/02/24 1951 05/03/24 0024 05/03/24 0347 05/03/24 0812  BP: (!) 132/39 (!) 138/42 (!) 162/49 (!) 148/46  Pulse: 69 66 63 64  Resp: 18 18 18 18   Temp: 98.3 F (36.8 C) 98.3 F (36.8 C) 98.3 F (36.8 C) 98.1 F (36.7 C)  TempSrc:  Oral Oral   SpO2: 99% 93% 99% 96%  Weight:      Height:        Intake/Output Summary (Last 24 hours) at 05/03/2024 1030 Last data filed at 05/03/2024 0352 Gross per 24 hour  Intake 120 ml  Output --  Net 120 ml      05/02/2024   10:44 AM 02/19/2024    3:28 PM 01/14/2024   11:12 AM  Last 3 Weights  Weight (lbs) 115 lb 115 lb 115 lb 6.4 oz  Weight (kg) 52.164 kg 52.164 kg 52.345 kg      Telemetry Sinus rhythm with rate of 60-70 with chronic right bundle-branch block and 1 3 beat run of nonsustained V. tach- Personally Reviewed  ECG  No new tracings- Personally Reviewed  Physical Exam  GEN: No acute distress.   Neck: No JVD Cardiac: RRR, II/VI systolic murmur RUSB, without rubs or gallops.  Respiratory: Clear to auscultation  bilaterally.  Respirations are unlabored at rest on room air GI: Soft, nontender, non-distended  MS: Trace pretibial edema; No deformity. Neuro:  Nonfocal  Psych: Normal affect   Labs High Sensitivity Troponin:   Recent Labs  Lab 05/02/24 1047 05/02/24 1224  TROPONINIHS 61* 101*     Chemistry Recent Labs  Lab 05/02/24 1047 05/03/24 0425  NA 132* 133*  K 4.4 4.6  CL 94* 94*  CO2 27 27  GLUCOSE 163* 105*  BUN 16 22  CREATININE 1.45* 1.59*  CALCIUM  8.9 9.1  GFRNONAA 35* 31*  ANIONGAP 11 12    Lipids  Recent Labs  Lab 05/03/24 0425  CHOL 148  TRIG 66  HDL 65  LDLCALC 70  CHOLHDL 2.3    Hematology Recent Labs  Lab 05/02/24 1047 05/03/24 0425  WBC 7.4 7.4  RBC 3.55* 3.33*  HGB 11.5* 10.6*  HCT 34.7* 32.4*  MCV 97.7 97.3  MCH 32.4 31.8  MCHC 33.1 32.7  RDW 12.1 12.2  PLT 284 284   Thyroid  No results for input(s): TSH, FREET4 in the last 168 hours.  BNPNo results for input(s): BNP, PROBNP in the last 168 hours.  DDimer No results for input(s): DDIMER in the last 168 hours.   Radiology  DG Chest 2 View Result Date: 05/02/2024  EXAM: 2 VIEW(S) XRAY OF THE CHEST 05/02/2024 11:06:00 AM COMPARISON: 06/14/2023 CLINICAL HISTORY: Chest pain. FINDINGS: LUNGS AND PLEURA: No focal pulmonary opacity. No pulmonary edema. No pleural effusion. No pneumothorax. HEART AND MEDIASTINUM: Aortic atherosclerosis. BONES AND SOFT TISSUES: Levoconvex lower thoracic scoliosis and degenerative changes. IMPRESSION: 1. No acute cardiopulmonary findings. 2. Aortic atherosclerosis. 3. Levoconvex lower thoracic scoliosis and thoracolumbar degenerative changes. Electronically signed by: Ryan Salvage MD 05/02/2024 11:55 AM EDT RP Workstation: [id number]    Cardiac Studies Echocardiogram ordered and pending  Patient Profile   89 y.o. female with a past medical history of paroxysmal atrial fibrillation in the setting of Lexiscan  administration, diastolic dysfunction, mild to  moderate aortic valve stenosis, CKD stage IIIb, carotid artery stenosis, hypertension, hyperlipidemia, COPD without prior tobacco use, who is being seen and evaluated for NSTEMI.  Assessment & Plan  Elevated high-sensitivity troponin/chest pain -Patient presented with complaints of left-sided chest pain that radiated to the left shoulder and associated dizziness and lightheadedness EMS was activated by her daughter -EMS administered 2 sprays of nitroglycerin -Since that time patient has remained chest pain-free -High-sensitivity troponins trended 61 and 101 -Started on heparin infusion in the emergency department can consider it for 48 hours -Discussed potentially doing left heart catheterization on Monday.  Patient and family would like to consider options and discuss prior to making decision on procedure -Echocardiogram ordered and pending with further recommendations to follow -Continue with telemetry monitoring -EKG as needed for pain or changes  Hypertension - Blood pressure -Continued on amlodipine  and carvedilol  -Vital signs per unit protocol  Hyperlipidemia -LDL 58 -Continued on atorvastatin  10 mg daily  CKD stage IIIb -Serum creatinine 1.59 -Baseline serum creatinine 1.35-1.45 -Continue to monitor urine output -Monitor/trend/replace electrolytes as needed -Daily BMP -Avoid nephrotoxic agents were able  Aortic valve stenosis -Heart murmur noted on exam -Chronic chronic baseline shortness of breath related to COPD -Echocardiogram ordered and pending with further recommendations to follow  COPD -Not in acute exacerbation -Continued on nebulizers every 6 hours as needed    For questions or updates, please contact [facility] Please consult [email or web address] for contact info under       Signed, Tanish Prien, NP  05/03/2024, 10:30 AM  \

## 2024-05-03 NOTE — Plan of Care (Signed)

## 2024-05-03 NOTE — Progress Notes (Signed)
 Progress Note    Brenda Tucker  FMW:[id number] DOB: 28-Mar-1934  DOA: 05/02/2024 PCP: Kristina Tinnie POUR, PA-C      Brief Narrative:    Medical records reviewed and are as summarized below:  Brenda Tucker is a 89 y.o. female with medical history significant for COPD, hypertension, hypothyroidism, hyperlipidemia, GERD, who presented to the hospital with chest pain.  Vital signs in the ED: Temperature 98.2 F, respiratory 17, pulse 75, BP 151/81, O2 sat 96% on room air.  BP went up to 221/66.   Initial troponin was 61 and repeat troponin up to 101.  She was admitted to the hospital for possible acute NSTEMI.     Assessment/Plan:   Principal Problem:   NSTEMI (non-ST elevated myocardial infarction) (HCC) Active Problems:   Chronic obstructive pulmonary disease (HCC)   Hypertension   GERD (gastroesophageal reflux disease)   Hyperlipidemia   CKD stage 3b, GFR 30-44 ml/min (HCC)   Hypothyroidism   Dysuria    Body mass index is 25.78 kg/m.   Chest pain, elevated troponin: Elevated troponin probably from demand ischemia.  Cardiologist recommended continuing IV heparin drip through tomorrow.  Monitor heparin level per protocol. Possibility of left heart cath on Monday 05/05/2024.   Severe aortic stenosis: This could be the cause of symptoms. 2D echo showed EF estimated at 60 to 65%, mild concentric LVH, grade 1 diastolic dysfunction, severe aortic stenosis   Hypertensive urgency, hypertension: BP is better.  Continue antihypertensives   COPD: Continue bronchodilators   Dysuria, abnormal urinalysis: Obtain urine culture.  Hold off antibiotics for now.   Comorbidities include CKD stage IIIb, hyperlipidemia,   Diet Order             Diet Heart Fluid consistency: Thin  Diet effective now                                  Consultants: Cardiologist  Procedures: None    Medications:    amLODipine   2.5 mg Oral Daily   atorvastatin    10 mg Oral QODAY   carvedilol   12.5 mg Oral BID WC   cholecalciferol  2,000 Units Oral Daily   cyanocobalamin  1,000 mcg Oral Daily   fluticasone  furoate-vilanterol  1 puff Inhalation Daily   levothyroxine   50 mcg Oral Q0600   multivitamin  1 tablet Oral Daily   Netarsudil-Latanoprost  1 drop Both Eyes QHS   Continuous Infusions:  heparin 750 Units/hr (05/03/24 0957)     Anti-infectives (From admission, onward)    None              Family Communication/Anticipated D/C date and plan/Code Status   DVT prophylaxis: Place TED hose Start: 05/02/24 1404     Code Status: Limited: Do not attempt resuscitation (DNR) -DNR-LIMITED -Do Not Intubate/DNI   Family Communication: None Disposition Plan: Plan to discharge home   Status is: Inpatient Remains inpatient appropriate because: Chest pain, severe aortic stenosis       Subjective:   Interval events noted.  No chest pain or shortness of breath.  She feels better.  Objective:    Vitals:   05/03/24 0024 05/03/24 0347 05/03/24 0812 05/03/24 1207  BP: (!) 138/42 (!) 162/49 (!) 148/46 (!) 114/50  Pulse: 66 63 64 (!) 59  Resp: 18 18 18 18   Temp: 98.3 F (36.8 C) 98.3 F (36.8 C) 98.1 F (36.7 C) 97.7 F (  36.5 C)  TempSrc: Oral Oral    SpO2: 93% 99% 96% 96%  Weight:      Height:       No data found.   Intake/Output Summary (Last 24 hours) at 05/03/2024 1550 Last data filed at 05/03/2024 1419 Gross per 24 hour  Intake 720 ml  Output --  Net 720 ml   Filed Weights   05/02/24 1044  Weight: 52.2 kg    Exam:  GEN: NAD SKIN: Warm and dry EYES: No pallor or icterus ENT: MMM CV: RRR, systolic murmur loudest in aortic area PULM: CTA B ABD: soft, ND, NT, +BS CNS: AAO x 3, non focal EXT: No edema or tenderness        Data Reviewed:   I have personally reviewed following labs and imaging studies:  Labs: Labs show the following:   Basic Metabolic Panel: Recent Labs  Lab 05/02/24 1047  05/03/24 0425  NA 132* 133*  K 4.4 4.6  CL 94* 94*  CO2 27 27  GLUCOSE 163* 105*  BUN 16 22  CREATININE 1.45* 1.59*  CALCIUM  8.9 9.1   GFR Estimated Creatinine Clearance: 16.5 mL/min (A) (by C-G formula based on SCr of 1.59 mg/dL (H)). Liver Function Tests: No results for input(s): AST, ALT, ALKPHOS, BILITOT, PROT, ALBUMIN in the last 168 hours. No results for input(s): LIPASE, AMYLASE in the last 168 hours. No results for input(s): AMMONIA in the last 168 hours. Coagulation profile Recent Labs  Lab 05/02/24 1340  INR 1.0    CBC: Recent Labs  Lab 05/02/24 1047 05/03/24 0425  WBC 7.4 7.4  HGB 11.5* 10.6*  HCT 34.7* 32.4*  MCV 97.7 97.3  PLT 284 284   Cardiac Enzymes: No results for input(s): CKTOTAL, CKMB, CKMBINDEX, TROPONINI in the last 168 hours. BNP (last 3 results) No results for input(s): PROBNP in the last 8760 hours. CBG: No results for input(s): GLUCAP in the last 168 hours. D-Dimer: No results for input(s): DDIMER in the last 72 hours. Hgb A1c: Recent Labs    05/03/24 0800  HGBA1C 5.9*   Lipid Profile: Recent Labs    05/03/24 0425  CHOL 148  HDL 65  LDLCALC 70  TRIG 66  CHOLHDL 2.3   Thyroid  function studies: No results for input(s): TSH, T4TOTAL, T3FREE, THYROIDAB in the last 72 hours.  Invalid input(s): FREET3 Anemia work up: No results for input(s): VITAMINB12, FOLATE, FERRITIN, TIBC, IRON, RETICCTPCT in the last 72 hours. Sepsis Labs: Recent Labs  Lab 05/02/24 1047 05/03/24 0425  WBC 7.4 7.4    Microbiology No results found for this or any previous visit (from the past 240 hours).  Procedures and diagnostic studies:  ECHOCARDIOGRAM COMPLETE Result Date: 05/03/2024    ECHOCARDIOGRAM REPORT   Patient Name:   Brenda Tucker Date of Exam: 05/03/2024 Medical Rec #:  [id number]  Height:       56.0 in Accession #:    [id number] Weight:       115.0 lb Date of Birth:  [birth year]  BSA:           1.403 m Patient Age:    89 years   BP:           162/49 mmHg Patient Gender: F          HR:           65 bpm. Exam Location:  [facility] Procedure: 2D Echo, Cardiac Doppler, Color Doppler and Strain Analysis (Both  Spectral and Color Flow Doppler were utilized during procedure). Indications:     Chest Pain R07.9  History:         Patient has prior history of Echocardiogram examinations, most                  recent 06/20/2022.  Sonographer:     Thedora Louder RDCS, FASE Referring Phys:  [id number] AMY N COX Diagnosing Phys: Shelda Bruckner MD  Sonographer Comments: Global longitudinal strain was attempted. IMPRESSIONS  1. Left ventricular ejection fraction, by estimation, is 60 to 65%. The left ventricle has normal function. The left ventricle has no regional wall motion abnormalities. There is mild concentric left ventricular hypertrophy. Left ventricular diastolic parameters are consistent with Grade I diastolic dysfunction (impaired relaxation). The average left ventricular global longitudinal strain is -16.7 %. The global longitudinal strain is normal.  2. Right ventricular systolic function is normal. The right ventricular size is normal. There is normal pulmonary artery systolic pressure.  3. Left atrial size was mildly dilated.  4. The mitral valve is normal in structure. Trivial mitral valve regurgitation. No evidence of mitral stenosis. Moderate mitral annular calcification.  5. Aortic valve stenosis is severe by AVA and DI, moderate by velocity/gradient but with low stroke volume, suspect this is low flow low gradient. The aortic valve is calcified. There is moderate calcification of the aortic valve. There is moderate thickening of the aortic valve. Aortic valve regurgitation is mild. moderate to severe. Aortic valve area, by VTI measures 0.60 cm. Aortic valve mean gradient measures 28.6 mmHg. Aortic valve Vmax measures 3.59 m/s.  6. The inferior vena cava is normal in size with greater  than 50% respiratory variability, suggesting right atrial pressure of 3 mmHg. Comparison(s): Changes from prior study are noted. Aortic stenosis has progressed to severe compared to study from 2023. Conclusion(s)/Recommendation(s): Low flow low gradient severe aortic stenosis as noted. FINDINGS  Left Ventricle: Left ventricular ejection fraction, by estimation, is 60 to 65%. The left ventricle has normal function. The left ventricle has no regional wall motion abnormalities. The average left ventricular global longitudinal strain is -16.7 %. Strain was performed and the global longitudinal strain is normal. The left ventricular internal cavity size was normal in size. There is mild concentric left ventricular hypertrophy. Left ventricular diastolic parameters are consistent with Grade I diastolic dysfunction (impaired relaxation). Right Ventricle: The right ventricular size is normal. No increase in right ventricular wall thickness. Right ventricular systolic function is normal. There is normal pulmonary artery systolic pressure. The tricuspid regurgitant velocity is 1.97 m/s, and  with an assumed right atrial pressure of 3 mmHg, the estimated right ventricular systolic pressure is 18.5 mmHg. Left Atrium: Left atrial size was mildly dilated. Right Atrium: Right atrial size was normal in size. Pericardium: Trivial pericardial effusion is present. Mitral Valve: The mitral valve is normal in structure. Moderate mitral annular calcification. Trivial mitral valve regurgitation. No evidence of mitral valve stenosis. Tricuspid Valve: The tricuspid valve is normal in structure. Tricuspid valve regurgitation is trivial. No evidence of tricuspid stenosis. Aortic Valve: Aortic valve stenosis is severe by AVA and DI, moderate by velocity/gradient but with low stroke volume, suspect this is low flow low gradient. The aortic valve is calcified. There is moderate calcification of the aortic valve. There is moderate thickening of  the aortic valve. Aortic valve regurgitation is mild. Aortic regurgitation PHT measures 534 msec. Moderate to severe. Aortic valve mean gradient measures 28.6 mmHg. Aortic valve peak gradient measures  51.7 mmHg. Aortic valve area, by VTI measures 0.60 cm. Pulmonic Valve: The pulmonic valve was not well visualized. Pulmonic valve regurgitation is not visualized. No evidence of pulmonic stenosis. Aorta: The aortic root and ascending aorta are structurally normal, with no evidence of dilitation. Venous: The inferior vena cava is normal in size with greater than 50% respiratory variability, suggesting right atrial pressure of 3 mmHg. IAS/Shunts: The atrial septum is grossly normal.  LEFT VENTRICLE PLAX 2D LVIDd:         3.80 cm   Diastology LVIDs:         2.50 cm   LV e' medial:    5.44 cm/s LV PW:         1.50 cm   LV E/e' medial:  13.2 LV IVS:        1.30 cm   LV e' lateral:   4.90 cm/s LVOT diam:     1.80 cm   LV E/e' lateral: 14.6 LV SV:         47 LV SV Index:   34        2D Longitudinal Strain LVOT Area:     2.54 cm  2D Strain GLS (A4C):   -13.7 % LV IVRT:       119 msec  2D Strain GLS (A3C):   -16.3 %                          2D Strain GLS (A2C):   -20.2 %                          2D Strain GLS Avg:     -16.7 % RIGHT VENTRICLE RV Basal diam:  2.50 cm RV S prime:     10.00 cm/s TAPSE (M-mode): 2.3 cm LEFT ATRIUM           Index        RIGHT ATRIUM          Index LA diam:      3.90 cm 2.78 cm/m   RA Area:     7.44 cm LA Vol (A2C): 27.4 ml 19.53 ml/m  RA Volume:   12.20 ml 8.69 ml/m LA Vol (A4C): 40.1 ml 28.58 ml/m  AORTIC VALVE                     PULMONIC VALVE AV Area (Vmax):    0.61 cm      PV Vmax:        1.10 m/s AV Area (Vmean):   0.60 cm      PV Peak grad:   4.8 mmHg AV Area (VTI):     0.60 cm      RVOT Peak grad: 4 mmHg AV Vmax:           359.40 cm/s AV Vmean:          250.400 cm/s AV VTI:            0.786 m AV Peak Grad:      51.7 mmHg AV Mean Grad:      28.6 mmHg LVOT Vmax:         86.70 cm/s  LVOT Vmean:        59.300 cm/s LVOT VTI:          0.186 m LVOT/AV VTI ratio: 0.24 AI PHT:            534 msec  AORTA Ao Root diam: 2.80 cm Ao Asc diam:  2.90 cm MITRAL VALVE               TRICUSPID VALVE MV Area (PHT): 2.43 cm    TR Peak grad:   15.5 mmHg MV Decel Time: 312 msec    TR Vmax:        197.00 cm/s MV E velocity: 71.60 cm/s MV A velocity: 94.30 cm/s  SHUNTS MV E/A ratio:  0.76        Systemic VTI:  0.19 m                            Systemic Diam: 1.80 cm Shelda Bruckner MD Electronically signed by Shelda Bruckner MD Signature Date/Time: 05/03/2024/1:13:11 PM    Final    DG Chest 2 View Result Date: 05/02/2024 EXAM: 2 VIEW(S) XRAY OF THE CHEST 05/02/2024 11:06:00 AM COMPARISON: 06/14/2023 CLINICAL HISTORY: Chest pain. FINDINGS: LUNGS AND PLEURA: No focal pulmonary opacity. No pulmonary edema. No pleural effusion. No pneumothorax. HEART AND MEDIASTINUM: Aortic atherosclerosis. BONES AND SOFT TISSUES: Levoconvex lower thoracic scoliosis and degenerative changes. IMPRESSION: 1. No acute cardiopulmonary findings. 2. Aortic atherosclerosis. 3. Levoconvex lower thoracic scoliosis and thoracolumbar degenerative changes. Electronically signed by: Ryan Salvage MD 05/02/2024 11:55 AM EDT RP Workstation: [id number]               LOS: 1 day   Tonio Seider  [facility]   Pager on [email or web address]. If 7PM-7AM, please contact night-coverage at [email or web address]     05/03/2024, 3:50 PM

## 2024-05-03 NOTE — Consult Note (Signed)
 PHARMACY - ANTICOAGULATION CONSULT NOTE  Pharmacy Consult for heparin infusion Indication: chest pain/ACS  Allergies  Allergen Reactions   Theodrenaline     Shaky, nervous    Patient Measurements: Height: 4' 8 (142.2 cm) Weight: 52.2 kg (115 lb) IBW/kg (Calculated) : 36.3 HEPARIN DW (KG): 47.4  Vital Signs: Temp: 98.1 F (36.7 C) (11/01 0812) Temp Source: Oral (11/01 0347) BP: 148/46 (11/01 0812) Pulse Rate: 64 (11/01 0812)  Labs: Recent Labs    05/02/24 1047 05/02/24 1224 05/02/24 1340 05/02/24 2231 05/03/24 0425 05/03/24 0831  HGB 11.5*  --   --   --  10.6*  --   HCT 34.7*  --   --   --  32.4*  --   PLT 284  --   --   --  284  --   APTT  --   --  27  --   --   --   LABPROT  --   --  14.2  --   --   --   INR  --   --  1.0  --   --   --   HEPARINUNFRC  --   --   --  0.24*  --  0.29*  CREATININE 1.45*  --   --   --  1.59*  --   TROPONINIHS 61* 101*  --   --   --   --     Estimated Creatinine Clearance: 16.5 mL/min (A) (by C-G formula based on SCr of 1.59 mg/dL (H)).   Medical History: Past Medical History:  Diagnosis Date   Arthritis    Asthma    Cancer (HCC)    skin, colon polyp   Chronic kidney disease    Complication of anesthesia    asthma attack per pt   COPD (chronic obstructive pulmonary disease) (HCC)    Dyspnea    Hypertension    Hypothyroidism     Medications:  No home anticoagulants per pharmacist review  Assessment: 89 yo female presented to ED with complaint of chest pain.  In ED patient found to have elevated troponin.  Pharmacy consulted to initiate heparin infusion.  10/31 2231 HL 0.24, subtherapeutic 11/01 0831 HL 0.29.    Goal of Therapy:  Heparin level 0.3-0.7 units/ml Monitor platelets by anticoagulation protocol: Yes    Plan:  Heparin level is subtherapeutic. Will give heparin bolus of 700 units x 1 and increase heparin infusion to 750 units/hr. Recheck heparin level in 8 hours. CBC daily while on heparin.   Cathaleen Blanch, PharmD, BCPS 05/03/2024 9:11 AM

## 2024-05-04 DIAGNOSIS — I214 Non-ST elevation (NSTEMI) myocardial infarction: Secondary | ICD-10-CM | POA: Diagnosis not present

## 2024-05-04 DIAGNOSIS — I350 Nonrheumatic aortic (valve) stenosis: Secondary | ICD-10-CM | POA: Diagnosis not present

## 2024-05-04 DIAGNOSIS — I10 Essential (primary) hypertension: Secondary | ICD-10-CM | POA: Diagnosis not present

## 2024-05-04 DIAGNOSIS — N1832 Chronic kidney disease, stage 3b: Secondary | ICD-10-CM | POA: Diagnosis not present

## 2024-05-04 LAB — CBC
HCT: 29.7 % — ABNORMAL LOW (ref 36.0–46.0)
Hemoglobin: 10 g/dL — ABNORMAL LOW (ref 12.0–15.0)
MCH: 32.5 pg (ref 26.0–34.0)
MCHC: 33.7 g/dL (ref 30.0–36.0)
MCV: 96.4 fL (ref 80.0–100.0)
Platelets: 260 K/uL (ref 150–400)
RBC: 3.08 MIL/uL — ABNORMAL LOW (ref 3.87–5.11)
RDW: 12.1 % (ref 11.5–15.5)
WBC: 10.7 K/uL — ABNORMAL HIGH (ref 4.0–10.5)
nRBC: 0 % (ref 0.0–0.2)

## 2024-05-04 LAB — HEPARIN LEVEL (UNFRACTIONATED): Heparin Unfractionated: 0.44 [IU]/mL (ref 0.30–0.70)

## 2024-05-04 NOTE — Progress Notes (Signed)
 Rounding Note   Patient Name: Brenda Tucker Date of Encounter: 05/04/2024  [facility] Cardiologist: Redell Cave, MD   Subjective Patient seen on a.m. rounds.  Denies any chest pain or shortness of breath.  Overnight was uneventful. Sitting up in bed eating breakfast.   Scheduled Meds:  amLODipine   2.5 mg Oral Daily   atorvastatin   10 mg Oral QODAY   carvedilol   12.5 mg Oral BID WC   cholecalciferol  2,000 Units Oral Daily   cyanocobalamin  1,000 mcg Oral Daily   fluticasone  furoate-vilanterol  1 puff Inhalation Daily   levothyroxine   50 mcg Oral Q0600   multivitamin  1 tablet Oral Daily   Netarsudil-Latanoprost  1 drop Both Eyes QHS   Continuous Infusions:  heparin 750 Units/hr (05/03/24 0957)   PRN Meds: acetaminophen  **OR** acetaminophen , hydrALAZINE , ipratropium-albuterol , melatonin, ondansetron  **OR** ondansetron  (ZOFRAN ) IV   Vital Signs  Vitals:   05/03/24 1622 05/03/24 1958 05/03/24 2353 05/04/24 0430  BP: (!) 142/56 (!) 118/41 (!) 130/52 (!) 136/49  Pulse: 67 64 67 65  Resp: 20 17 18 17   Temp: 98.3 F (36.8 C) 98.3 F (36.8 C) 98.4 F (36.9 C) 98.8 F (37.1 C)  TempSrc:  Oral Oral Oral  SpO2: 97% 97% 97% 97%  Weight:      Height:        Intake/Output Summary (Last 24 hours) at 05/04/2024 0804 Last data filed at 05/04/2024 0400 Gross per 24 hour  Intake 1165.15 ml  Output --  Net 1165.15 ml      05/02/2024   10:44 AM 02/19/2024    3:28 PM 01/14/2024   11:12 AM  Last 3 Weights  Weight (lbs) 115 lb 115 lb 115 lb 6.4 oz  Weight (kg) 52.164 kg 52.164 kg 52.345 kg      Telemetry Sinus rhythm with rate of 60-70 with chronic right bundle-branch block and 1 3 beat run of nonsustained V. tach- Personally Reviewed  ECG  No new tracings- Personally Reviewed  Physical Exam  GEN: Frail. No acute distress.   Neck: No JVD Cardiac: RRR, II/VI systolic murmur RUSB, without rubs or gallops.  Respiratory: Clear to auscultation bilaterally.   Respirations are unlabored at rest on room air GI: Soft, nontender, non-distended  MS: Trace pretibial edema; No deformity. Neuro:  Nonfocal  Psych: Normal affect   Labs High Sensitivity Troponin:   Recent Labs  Lab 05/02/24 1047 05/02/24 1224  TROPONINIHS 61* 101*     Chemistry Recent Labs  Lab 05/02/24 1047 05/03/24 0425  NA 132* 133*  K 4.4 4.6  CL 94* 94*  CO2 27 27  GLUCOSE 163* 105*  BUN 16 22  CREATININE 1.45* 1.59*  CALCIUM  8.9 9.1  GFRNONAA 35* 31*  ANIONGAP 11 12    Lipids  Recent Labs  Lab 05/03/24 0425  CHOL 148  TRIG 66  HDL 65  LDLCALC 70  CHOLHDL 2.3    Hematology Recent Labs  Lab 05/02/24 1047 05/03/24 0425 05/04/24 0539  WBC 7.4 7.4 10.7*  RBC 3.55* 3.33* 3.08*  HGB 11.5* 10.6* 10.0*  HCT 34.7* 32.4* 29.7*  MCV 97.7 97.3 96.4  MCH 32.4 31.8 32.5  MCHC 33.1 32.7 33.7  RDW 12.1 12.2 12.1  PLT 284 284 260   Thyroid  No results for input(s): TSH, FREET4 in the last 168 hours.  BNPNo results for input(s): BNP, PROBNP in the last 168 hours.  DDimer No results for input(s): DDIMER in the last 168 hours.  Radiology    Cardiac Studies 2d echo 05/03/2024  1. Left ventricular ejection fraction, by estimation, is 60 to 65%. The  left ventricle has normal function. The left ventricle has no regional  wall motion abnormalities. There is mild concentric left ventricular  hypertrophy. Left ventricular diastolic  parameters are consistent with Grade I diastolic dysfunction (impaired  relaxation). The average left ventricular global longitudinal strain is  -16.7 %. The global longitudinal strain is normal.   2. Right ventricular systolic function is normal. The right ventricular  size is normal. There is normal pulmonary artery systolic pressure.   3. Left atrial size was mildly dilated.   4. The mitral valve is normal in structure. Trivial mitral valve  regurgitation. No evidence of mitral stenosis. Moderate mitral annular   calcification.   5. Aortic valve stenosis is severe by AVA and DI, moderate by  velocity/gradient but with low stroke volume, suspect this is low flow low  gradient. The aortic valve is calcified. There is moderate calcification  of the aortic valve. There is moderate  thickening of the aortic valve. Aortic valve regurgitation is mild.  moderate to severe. Aortic valve area, by VTI measures 0.60 cm. Aortic  valve mean gradient measures 28.6 mmHg. Aortic valve Vmax measures 3.59  m/s.   6. The inferior vena cava is normal in size with greater than 50%  respiratory variability, suggesting right atrial pressure of 3 mmHg.   Patient Profile   89 y.o. female with a past medical history of paroxysmal atrial fibrillation in the setting of Lexiscan  administration, diastolic dysfunction, mild to moderate aortic valve stenosis, CKD stage IIIb, carotid artery stenosis, hypertension, hyperlipidemia, COPD without prior tobacco use, who is being seen and evaluated for elevated high-sensitivity troponin with chest pain.  Assessment & Plan  Elevated high-sensitivity troponin/chest pain -Patient presented with complaints of left-sided chest pain that radiated to the left shoulder and associated dizziness and lightheadedness EMS was activated by her daughter -EMS administered 2 sprays of nitroglycerin -Since that time patient has remained chest pain-free -High-sensitivity troponins trended 61 and 101 -Recommend d/c Heparin infusion at the end of 48 hour timeframe -Discussed potentially doing right and left heart catheterization on Monday.  Patient continues to be hesitant at this time.  She would be high risk for PCI.  Patient and family would like to consider options and discuss prior to making decision on procedure. Still undecided.  -Continue with telemetry monitoring -EKG as needed for pain or changes  Hypertension - Blood pressure 136/49 -Continued on amlodipine  and carvedilol  -Vital signs per  unit protocol  Hyperlipidemia -LDL 58 -Continued on atorvastatin  10 mg daily  CKD stage IIIb -No new results for today as of the time of rounds -Baseline serum creatinine 1.35-1.45 -Continue to monitor urine output -Monitor/trend/replace electrolytes as needed -Daily BMP -Avoid nephrotoxic agents were able  Aortic valve stenosis -Heart murmur noted on exam -Chronic chronic baseline shortness of breath related to COPD - Likely the etiology of her symptoms. - Patient would like to discuss next steps with her family when they are available.  COPD -Not in acute exacerbation -Continued on nebulizers every 6 hours as needed    For questions or updates, please contact [facility] Please consult [email or web address] for contact info under       Signed, Jawanza Zambito, NP  05/04/2024, 8:04 AM  \

## 2024-05-04 NOTE — Discharge Summary (Addendum)
 Physician Discharge Summary   Patient: Brenda Tucker MRN: [id number] DOB: December 02, 1933  Admit date:     05/02/2024  Discharge date: 05/05/24  Discharge Physician: AIDA CHO   PCP: Kristina Tinnie POUR, PA-C   Recommendations at discharge:   Follow-up with PCP in 1 week Follow-up with [facility] cardiology in 1 week  Discharge Diagnoses: Principal Problem:   NSTEMI (non-ST elevated myocardial infarction) (HCC) Active Problems:   Chronic obstructive pulmonary disease (HCC)   Hypertension   GERD (gastroesophageal reflux disease)   Hyperlipidemia   CKD stage 3b, GFR 30-44 ml/min (HCC)   Hypothyroidism   Dysuria  Resolved Problems:   * No resolved hospital problems. [facility] Course:  Brenda Tucker is a 89 y.o. female with medical history significant for COPD, hypertension, hypothyroidism, hyperlipidemia, GERD, who presented to the hospital with chest pain.   Vital signs in the ED: Temperature 98.2 F, respiratory 17, pulse 75, BP 151/81, O2 sat 96% on room air.  BP went up to 221/66.     Initial troponin was 61 and repeat troponin up to 101.   She was admitted to the hospital for possible acute NSTEMI.     Assessment and Plan:   Chest pain, elevated troponin: Elevated troponin probably from demand ischemia.  S/p treatment with IV heparin drip. Patient declined left heart catheterization.      Severe aortic stenosis: This could be the cause of symptoms. 2D echo showed EF estimated at 60 to 65%, mild concentric LVH, grade 1 diastolic dysfunction, severe aortic stenosis. Case discussed with Dr. Lonni, cardiologist.  Patient is okay for discharge from her standpoint.     Hypertensive urgency, hypertension: BP is better.  Continue antihypertensives     COPD: Continue bronchodilators     Abnormal urinalysis: Currently asymptomatic. Urine culture showed multiple species.      Comorbidities include CKD stage IIIb, hyperlipidemia,    Her condition has improved.   She wants to go home today.  No cardiac, respiratory, neurologic, constitutional or urinary symptoms.       Consultants: Cardiologist Procedures performed: None Disposition: Home Diet recommendation:  Discharge Diet Orders (From admission, onward)     Start     Ordered   05/04/24 0000  Diet - low sodium heart healthy        05/04/24 1404           Cardiac diet DISCHARGE MEDICATION: Allergies as of 05/04/2024       Reactions   Theodrenaline    Shaky, nervous        Medication List     TAKE these medications    acetaminophen  500 MG tablet Commonly known as: TYLENOL  Take 500 mg by mouth every 6 (six) hours as needed for moderate pain or headache.   albuterol  108 (90 Base) MCG/ACT inhaler Commonly known as: VENTOLIN  HFA Inhale 2 puffs into the lungs every 6 (six) hours as needed for wheezing or shortness of breath.   amLODipine  2.5 MG tablet Commonly known as: NORVASC  Take 1 tablet (2.5 mg total) by mouth daily.   atorvastatin  10 MG tablet Commonly known as: LIPITOR TAKE 1 TABLET BY MOUTH EVERY OTHER DAY   budesonide -formoterol  160-4.5 MCG/ACT inhaler Commonly known as: SYMBICORT  Inhale 2 puffs into the lungs 2 (two) times daily.   calcium  carbonate 1500 (600 Ca) MG Tabs tablet Commonly known as: OSCAL Take 600 mg of elemental calcium  by mouth daily.   carvedilol  25 MG tablet Commonly known as: COREG  TAKE 1/2  TABLET (12.5 MG) BY MOUTH TWICE DAILY WITH A MEAL   cyanocobalamin 1000 MCG tablet Commonly known as: VITAMIN B12 Take 1,000 mcg by mouth daily.   furosemide  20 MG tablet Commonly known as: LASIX  TAKE 1 TABLET (20MG ) BY MOUTH DAILY   hydrALAZINE  10 MG tablet Commonly known as: APRESOLINE  TAKE 1 TABLET BY MOUTH EVERY DAY AS NEEDED   levothyroxine  50 MCG tablet Commonly known as: SYNTHROID  TAKE 1 TABLET (50 MCG) BY MOUTH DAILY BEFORE BREAKFAST   multivitamin-lutein Caps capsule Take 1 capsule by mouth daily.   OXYGEN  Inhale into the  lungs. 2 litre AHP   Rocklatan 0.02-0.005 % Soln Generic drug: Netarsudil-Latanoprost Place 1 drop into both eyes at bedtime.   Vitamin D  50 MCG (2000 UT) tablet Take 2,000 Units by mouth daily.        Follow-up Information     [facility]. Schedule an appointment as soon as possible for a visit in 1 week(s).   Specialty: Cardiology Contact information: [address] [place]  [address] [phone]               Discharge Exam: Fredricka Weights   05/02/24 1044  Weight: 52.2 kg   GEN: NAD SKIN: Warm and dry EYES: No pallor or icterus ENT: MMM CV: RRR systolic murmur loudest in the aortic area PULM: CTA B ABD: soft, ND, NT, +BS CNS: AAO x 3, non focal EXT: No edema or tenderness   Condition at discharge: good  The results of significant diagnostics from this hospitalization (including imaging, microbiology, ancillary and laboratory) are listed below for reference.   Imaging Studies: ECHOCARDIOGRAM COMPLETE Result Date: 05/03/2024    ECHOCARDIOGRAM REPORT   Patient Name:   Brenda Tucker Saline Date of Exam: 05/03/2024 Medical Rec #:  [id number]  Height:       56.0 in Accession #:    [id number] Weight:       115.0 lb Date of Birth:  09/02/1933  BSA:          1.403 m Patient Age:    89 years   BP:           162/49 mmHg Patient Gender: F          HR:           65 bpm. Exam Location:  [facility] Procedure: 2D Echo, Cardiac Doppler, Color Doppler and Strain Analysis (Both            Spectral and Color Flow Doppler were utilized during procedure). Indications:     Chest Pain R07.9  History:         Patient has prior history of Echocardiogram examinations, most                  recent 06/20/2022.  Sonographer:     Thedora Louder RDCS, FASE Referring Phys:  [id number] AMY N COX Diagnosing Phys: Shelda Bruckner MD  Sonographer Comments: Global longitudinal strain was attempted. IMPRESSIONS  1. Left ventricular ejection fraction, by  estimation, is 60 to 65%. The left ventricle has normal function. The left ventricle has no regional wall motion abnormalities. There is mild concentric left ventricular hypertrophy. Left ventricular diastolic parameters are consistent with Grade I diastolic dysfunction (impaired relaxation). The average left ventricular global longitudinal strain is -16.7 %. The global longitudinal strain is normal.  2. Right ventricular systolic function is normal. The right ventricular size is normal. There is normal pulmonary artery systolic pressure.  3. Left atrial  size was mildly dilated.  4. The mitral valve is normal in structure. Trivial mitral valve regurgitation. No evidence of mitral stenosis. Moderate mitral annular calcification.  5. Aortic valve stenosis is severe by AVA and DI, moderate by velocity/gradient but with low stroke volume, suspect this is low flow low gradient. The aortic valve is calcified. There is moderate calcification of the aortic valve. There is moderate thickening of the aortic valve. Aortic valve regurgitation is mild. moderate to severe. Aortic valve area, by VTI measures 0.60 cm. Aortic valve mean gradient measures 28.6 mmHg. Aortic valve Vmax measures 3.59 m/s.  6. The inferior vena cava is normal in size with greater than 50% respiratory variability, suggesting right atrial pressure of 3 mmHg. Comparison(s): Changes from prior study are noted. Aortic stenosis has progressed to severe compared to study from 2023. Conclusion(s)/Recommendation(s): Low flow low gradient severe aortic stenosis as noted. FINDINGS  Left Ventricle: Left ventricular ejection fraction, by estimation, is 60 to 65%. The left ventricle has normal function. The left ventricle has no regional wall motion abnormalities. The average left ventricular global longitudinal strain is -16.7 %. Strain was performed and the global longitudinal strain is normal. The left ventricular internal cavity size was normal in size. There is  mild concentric left ventricular hypertrophy. Left ventricular diastolic parameters are consistent with Grade I diastolic dysfunction (impaired relaxation). Right Ventricle: The right ventricular size is normal. No increase in right ventricular wall thickness. Right ventricular systolic function is normal. There is normal pulmonary artery systolic pressure. The tricuspid regurgitant velocity is 1.97 m/s, and  with an assumed right atrial pressure of 3 mmHg, the estimated right ventricular systolic pressure is 18.5 mmHg. Left Atrium: Left atrial size was mildly dilated. Right Atrium: Right atrial size was normal in size. Pericardium: Trivial pericardial effusion is present. Mitral Valve: The mitral valve is normal in structure. Moderate mitral annular calcification. Trivial mitral valve regurgitation. No evidence of mitral valve stenosis. Tricuspid Valve: The tricuspid valve is normal in structure. Tricuspid valve regurgitation is trivial. No evidence of tricuspid stenosis. Aortic Valve: Aortic valve stenosis is severe by AVA and DI, moderate by velocity/gradient but with low stroke volume, suspect this is low flow low gradient. The aortic valve is calcified. There is moderate calcification of the aortic valve. There is moderate thickening of the aortic valve. Aortic valve regurgitation is mild. Aortic regurgitation PHT measures 534 msec. Moderate to severe. Aortic valve mean gradient measures 28.6 mmHg. Aortic valve peak gradient measures 51.7 mmHg. Aortic valve area, by VTI measures 0.60 cm. Pulmonic Valve: The pulmonic valve was not well visualized. Pulmonic valve regurgitation is not visualized. No evidence of pulmonic stenosis. Aorta: The aortic root and ascending aorta are structurally normal, with no evidence of dilitation. Venous: The inferior vena cava is normal in size with greater than 50% respiratory variability, suggesting right atrial pressure of 3 mmHg. IAS/Shunts: The atrial septum is grossly normal.   LEFT VENTRICLE PLAX 2D LVIDd:         3.80 cm   Diastology LVIDs:         2.50 cm   LV e' medial:    5.44 cm/s LV PW:         1.50 cm   LV E/e' medial:  13.2 LV IVS:        1.30 cm   LV e' lateral:   4.90 cm/s LVOT diam:     1.80 cm   LV E/e' lateral: 14.6 LV SV:  47 LV SV Index:   34        2D Longitudinal Strain LVOT Area:     2.54 cm  2D Strain GLS (A4C):   -13.7 % LV IVRT:       119 msec  2D Strain GLS (A3C):   -16.3 %                          2D Strain GLS (A2C):   -20.2 %                          2D Strain GLS Avg:     -16.7 % RIGHT VENTRICLE RV Basal diam:  2.50 cm RV S prime:     10.00 cm/s TAPSE (M-mode): 2.3 cm LEFT ATRIUM           Index        RIGHT ATRIUM          Index LA diam:      3.90 cm 2.78 cm/m   RA Area:     7.44 cm LA Vol (A2C): 27.4 ml 19.53 ml/m  RA Volume:   12.20 ml 8.69 ml/m LA Vol (A4C): 40.1 ml 28.58 ml/m  AORTIC VALVE                     PULMONIC VALVE AV Area (Vmax):    0.61 cm      PV Vmax:        1.10 m/s AV Area (Vmean):   0.60 cm      PV Peak grad:   4.8 mmHg AV Area (VTI):     0.60 cm      RVOT Peak grad: 4 mmHg AV Vmax:           359.40 cm/s AV Vmean:          250.400 cm/s AV VTI:            0.786 m AV Peak Grad:      51.7 mmHg AV Mean Grad:      28.6 mmHg LVOT Vmax:         86.70 cm/s LVOT Vmean:        59.300 cm/s LVOT VTI:          0.186 m LVOT/AV VTI ratio: 0.24 AI PHT:            534 msec  AORTA Ao Root diam: 2.80 cm Ao Asc diam:  2.90 cm MITRAL VALVE               TRICUSPID VALVE MV Area (PHT): 2.43 cm    TR Peak grad:   15.5 mmHg MV Decel Time: 312 msec    TR Vmax:        197.00 cm/s MV E velocity: 71.60 cm/s MV A velocity: 94.30 cm/s  SHUNTS MV E/A ratio:  0.76        Systemic VTI:  0.19 m                            Systemic Diam: 1.80 cm Shelda Bruckner MD Electronically signed by Shelda Bruckner MD Signature Date/Time: 05/03/2024/1:13:11 PM    Final    DG Chest 2 View Result Date: 05/02/2024 EXAM: 2 VIEW(S) XRAY OF THE CHEST 05/02/2024  11:06:00 AM COMPARISON: 06/14/2023 CLINICAL HISTORY: Chest pain. FINDINGS: LUNGS AND PLEURA: No focal pulmonary opacity. No  pulmonary edema. No pleural effusion. No pneumothorax. HEART AND MEDIASTINUM: Aortic atherosclerosis. BONES AND SOFT TISSUES: Levoconvex lower thoracic scoliosis and degenerative changes. IMPRESSION: 1. No acute cardiopulmonary findings. 2. Aortic atherosclerosis. 3. Levoconvex lower thoracic scoliosis and thoracolumbar degenerative changes. Electronically signed by: Ryan Salvage MD 05/02/2024 11:55 AM EDT RP Workstation: [id number]    Microbiology: Results for orders placed or performed during the hospital encounter of 05/02/24  Urine Culture (for pregnant, neutropenic or urologic patients or patients with an indwelling urinary catheter)     Status: Abnormal   Collection Time: 05/02/24  5:22 PM   Specimen: Urine, Clean Catch  Result Value Ref Range Status   Specimen Description   Final    URINE, CLEAN CATCH Performed at [facility], [address] [place], [place] [address]    Special Requests   Final    NONE Performed at [facility], [address] [place], [place] [address]    Culture MULTIPLE SPECIES PRESENT, SUGGEST RECOLLECTION (A)  Final   Report Status 05/05/2024 FINAL  Final    Labs: CBC: Recent Labs  Lab 05/02/24 1047 05/03/24 0425 05/04/24 0539  WBC 7.4 7.4 10.7*  HGB 11.5* 10.6* 10.0*  HCT 34.7* 32.4* 29.7*  MCV 97.7 97.3 96.4  PLT 284 284 260   Basic Metabolic Panel: Recent Labs  Lab 05/02/24 1047 05/03/24 0425  NA 132* 133*  K 4.4 4.6  CL 94* 94*  CO2 27 27  GLUCOSE 163* 105*  BUN 16 22  CREATININE 1.45* 1.59*  CALCIUM  8.9 9.1   Liver Function Tests: No results for input(s): AST, ALT, ALKPHOS, BILITOT, PROT, ALBUMIN in the last 168 hours. CBG: No results for input(s): GLUCAP in the last 168 hours.  Discharge time spent: greater than 30 minutes.  Signed: AIDA CHO, MD [facility] 05/05/2024

## 2024-05-04 NOTE — Consult Note (Signed)
 PHARMACY - ANTICOAGULATION CONSULT NOTE  Pharmacy Consult for heparin infusion Indication: chest pain/ACS  Allergies  Allergen Reactions   Theodrenaline     Shaky, nervous    Patient Measurements: Height: 4' 8 (142.2 cm) Weight: 52.2 kg (115 lb) IBW/kg (Calculated) : 36.3 HEPARIN DW (KG): 47.4  Vital Signs: Temp: 98.8 F (37.1 C) (11/02 0907) Temp Source: Oral (11/02 0907) BP: 139/39 (11/02 0907) Pulse Rate: 66 (11/02 0907)  Labs: Recent Labs    05/02/24 1047 05/02/24 1224 05/02/24 1340 05/02/24 2231 05/03/24 0425 05/03/24 0831 05/03/24 1756 05/04/24 0539 05/04/24 1141  HGB 11.5*  --   --   --  10.6*  --   --  10.0*  --   HCT 34.7*  --   --   --  32.4*  --   --  29.7*  --   PLT 284  --   --   --  284  --   --  260  --   APTT  --   --  27  --   --   --   --   --   --   LABPROT  --   --  14.2  --   --   --   --   --   --   INR  --   --  1.0  --   --   --   --   --   --   HEPARINUNFRC  --   --   --    < >  --  0.29* 0.69  --  0.44  CREATININE 1.45*  --   --   --  1.59*  --   --   --   --   TROPONINIHS 61* 101*  --   --   --   --   --   --   --    < > = values in this interval not displayed.    Estimated Creatinine Clearance: 16.5 mL/min (A) (by C-G formula based on SCr of 1.59 mg/dL (H)).   Medical History: Past Medical History:  Diagnosis Date   Arthritis    Asthma    Cancer (HCC)    skin, colon polyp   Chronic kidney disease    Complication of anesthesia    asthma attack per pt   COPD (chronic obstructive pulmonary disease) (HCC)    Dyspnea    Hypertension    Hypothyroidism     Medications:  No home anticoagulants per pharmacist review  Assessment: 89 yo female presented to ED with complaint of chest pain.  In ED patient found to have elevated troponin.  Pharmacy consulted to initiate heparin infusion.  10/31 2231 HL 0.24, subtherapeutic 11/01 0831 HL 0.29.  11/02 1141 HL 0.44, therapeutic x 1    Goal of Therapy:  Heparin level 0.3-0.7  units/ml Monitor platelets by anticoagulation protocol: Yes    Plan:  - Continue current heparin gtt rate at 750 units/hr - Will recheck HL in 8 hours - Monitor CBC daily while on heparin   Roshanna Cimino PGY-1 Pharmacy Resident  [place] [facility]  05/04/2024 12:27 PM

## 2024-05-05 LAB — URINE CULTURE: Culture: >=100000 — AB

## 2024-05-12 ENCOUNTER — Ambulatory Visit: Admitting: Physician Assistant

## 2024-05-12 ENCOUNTER — Encounter: Payer: Self-pay | Admitting: Physician Assistant

## 2024-05-12 VITALS — BP 150/71 | HR 75 | Temp 98.0°F | Resp 16 | Ht <= 58 in | Wt 109.0 lb

## 2024-05-12 DIAGNOSIS — N1832 Chronic kidney disease, stage 3b: Secondary | ICD-10-CM

## 2024-05-12 DIAGNOSIS — Z09 Encounter for follow-up examination after completed treatment for conditions other than malignant neoplasm: Secondary | ICD-10-CM | POA: Diagnosis not present

## 2024-05-12 DIAGNOSIS — I350 Nonrheumatic aortic (valve) stenosis: Secondary | ICD-10-CM

## 2024-05-12 DIAGNOSIS — I214 Non-ST elevation (NSTEMI) myocardial infarction: Secondary | ICD-10-CM

## 2024-05-12 DIAGNOSIS — J449 Chronic obstructive pulmonary disease, unspecified: Secondary | ICD-10-CM

## 2024-05-12 DIAGNOSIS — I10 Essential (primary) hypertension: Secondary | ICD-10-CM | POA: Diagnosis not present

## 2024-05-12 MED ORDER — HYDRALAZINE HCL 10 MG PO TABS: 10.0000 mg | ORAL_TABLET | Freq: Every day | ORAL | Status: DC

## 2024-05-12 NOTE — Progress Notes (Signed)
 [facility] [address] [place], [place] [address]  Internal MEDICINE  Office Visit Note  Patient Name: Brenda Tucker  [id number]  [id number]  Date of Service: 05/12/2024     Chief Complaint  Patient presents with   Hospitalization Follow-up    Myocardial Infarction    Medication Management     HPI Pt is here for recent hospital follow up. Her daughter in law is with her today Avi to ED on 10/31 for CP and was given nitroglycerin and aspirin in ED and found to have elevated troponin. Given IV heparin drip. Admitted until 05/04/24 -States she was declines cath during stay and that they were very worried about her valve. She reports thinking they said something about valve in throat, however on chart review I think she means her severe aortic stenosis which was thought to be main cause of her symptoms -no more CP since then -Continue to use oxygen , states she was given breo in hospital, but would rather continue her symbicort  instead which is still listed on her discharge summary -BP at home 159/62 this morning -only taking prn hydralazine  rarely. Will change to once daily now, Assad need to increase in future pending response. Will see cardiology Friday for hospital follow up  Current Medication: Outpatient Encounter Medications as of 05/12/2024  Medication Sig Note   acetaminophen  (TYLENOL ) 500 MG tablet Take 500 mg by mouth every 6 (six) hours as needed for moderate pain or headache. 05/02/2024: prn   amLODipine  (NORVASC ) 2.5 MG tablet Take 1 tablet (2.5 mg total) by mouth daily.    atorvastatin  (LIPITOR) 10 MG tablet TAKE 1 TABLET BY MOUTH EVERY OTHER DAY    budesonide -formoterol  (SYMBICORT ) 160-4.5 MCG/ACT inhaler Inhale 2 puffs into the lungs 2 (two) times daily.    calcium  carbonate (OSCAL) 1500 (600 Ca) MG TABS tablet Take 600 mg of elemental calcium  by mouth daily.    carvedilol  (COREG ) 25 MG tablet TAKE 1/2 TABLET (12.5 MG) BY MOUTH TWICE DAILY WITH A MEAL     Cholecalciferol (VITAMIN D ) 50 MCG (2000 UT) tablet Take 2,000 Units by mouth daily.    fluticasone  furoate-vilanterol (BREO ELLIPTA) 200-25 MCG/ACT AEPB Inhale 1 puff into the lungs daily.    furosemide  (LASIX ) 20 MG tablet TAKE 1 TABLET (20MG ) BY MOUTH DAILY    hydrALAZINE  (APRESOLINE ) 10 MG tablet TAKE 1 TABLET BY MOUTH EVERY DAY AS NEEDED 05/02/2024: prn   levothyroxine  (SYNTHROID ) 50 MCG tablet TAKE 1 TABLET (50 MCG) BY MOUTH DAILY BEFORE BREAKFAST    OXYGEN  Inhale into the lungs. 2 litre AHP    ROCKLATAN 0.02-0.005 % SOLN Place 1 drop into both eyes at bedtime.    vitamin B-12 (CYANOCOBALAMIN) 1000 MCG tablet Take 1,000 mcg by mouth daily.    [DISCONTINUED] albuterol  (VENTOLIN  HFA) 108 (90 Base) MCG/ACT inhaler Inhale 2 puffs into the lungs every 6 (six) hours as needed for wheezing or shortness of breath. 05/02/2024: prn   [DISCONTINUED] multivitamin-lutein (OCUVITE-LUTEIN) CAPS capsule Take 1 capsule by mouth daily.    [DISCONTINUED] fluticasone -salmeterol (ADVAIR DISKUS) 250-50 MCG/ACT AEPB INHALE 1 PUFF INTO THE LUNGS TWICE DAILY    No facility-administered encounter medications on file as of 05/12/2024.    Surgical History: Past Surgical History:  Procedure Laterality Date   ABDOMINAL HYSTERECTOMY     APPENDECTOMY     CHOLECYSTECTOMY N/A 03/12/2020   Procedure: LAPAROSCOPIC CHOLECYSTECTOMY;  Surgeon: Marolyn Nest, MD;  Location: [facility] ORS;  Service: General;  Laterality: N/A;   COLONOSCOPY WITH  PROPOFOL  N/A 07/09/2015   Procedure: COLONOSCOPY WITH PROPOFOL ;  Surgeon: Lamar ONEIDA Holmes, MD;  Location: [facility] ENDOSCOPY;  Service: Endoscopy;  Laterality: N/A;    Medical History: Past Medical History:  Diagnosis Date   Arthritis    Asthma    Cancer (HCC)    skin, colon polyp   Chronic kidney disease    Complication of anesthesia    asthma attack per pt   COPD (chronic obstructive pulmonary disease) (HCC)    Dyspnea    Hypertension    Hypothyroidism     Family  History: Family History  Problem Relation Age of Onset   Heart attack Father    Early death Sister        MVA   Stomach cancer Brother    Cancer Brother        shoulder   Lung cancer Brother    Cancer Sister    Cancer Sister    Early death Sister        husband killed her   Breast cancer Neg Hx     Social History   Socioeconomic History   Marital status: Widowed    Spouse name: Not on file   Number of children: Not on file   Years of education: Not on file   Highest education level: Not on file  Occupational History   Not on file  Tobacco Use   Smoking status: Never   Smokeless tobacco: Never   Tobacco comments:    second hand smoke  Vaping Use   Vaping status: Never Used  Substance and Sexual Activity   Alcohol use: No   Drug use: No   Sexual activity: Not Currently  Other Topics Concern   Not on file  Social History Narrative   Not on file   Social Drivers of Health   Financial Resource Strain: Low Risk  (12/15/2020)   Overall Financial Resource Strain (CARDIA)    Difficulty of Paying Living Expenses: Not very hard  Food Insecurity: No Food Insecurity (05/02/2024)   Hunger Vital Sign    Worried About Running Out of Food in the Last Year: Never true    Ran Out of Food in the Last Year: Never true  Transportation Needs: No Transportation Needs (05/02/2024)   PRAPARE - Administrator, Civil Service (Medical): No    Lack of Transportation (Non-Medical): No  Physical Activity: Not on file  Stress: Not on file  Social Connections: Unknown (05/02/2024)   Social Connection and Isolation Panel    Frequency of Communication with Friends and Family: Three times a week    Frequency of Social Gatherings with Friends and Family: Twice a week    Attends Religious Services: Patient declined    Database Administrator or Organizations: Patient declined    Attends Banker Meetings: Patient declined    Marital Status: Patient declined  Intimate  Partner Violence: Not At Risk (05/02/2024)   Humiliation, Afraid, Rape, and Kick questionnaire    Fear of Current or Ex-Partner: No    Emotionally Abused: No    Physically Abused: No    Sexually Abused: No      Review of Systems  Constitutional:  Negative for chills, fatigue and unexpected weight change.  HENT:  Positive for hearing loss. Negative for congestion, postnasal drip, rhinorrhea, sneezing and sore throat.   Respiratory:  Positive for shortness of breath (intermittent). Negative for cough, chest tightness and wheezing.   Cardiovascular:  Negative for chest pain  and palpitations.  Gastrointestinal:  Negative for abdominal pain, constipation, diarrhea, nausea and vomiting.  Genitourinary:  Negative for dysuria and frequency.  Skin:  Negative for rash.  Neurological: Negative.  Negative for tremors and numbness.  Psychiatric/Behavioral:  Behavioral problem: Depression. The patient is not nervous/anxious.     Vital Signs: BP (!) 150/71   Pulse 75   Temp 98 F (36.7 C)   Resp 16   Ht 4' 8 (1.422 m)   Wt 109 lb (49.4 kg)   SpO2 97%   BMI 24.44 kg/m    Physical Exam Vitals reviewed.  Constitutional:      General: She is not in acute distress.    Appearance: Normal appearance. She is well-developed and normal weight. She is not diaphoretic.  HENT:     Head: Normocephalic and atraumatic.  Eyes:     Extraocular Movements: Extraocular movements intact.  Neck:     Thyroid : No thyromegaly.     Vascular: No JVD.     Trachea: No tracheal deviation.  Cardiovascular:     Rate and Rhythm: Normal rate and regular rhythm.     Heart sounds: Murmur heard.     No friction rub. No gallop.  Pulmonary:     Effort: Pulmonary effort is normal.     Breath sounds: Normal breath sounds. No wheezing.  Skin:    General: Skin is warm and dry.  Neurological:     Mental Status: She is alert and oriented to person, place, and time.  Psychiatric:        Mood and Affect: Mood normal.         Behavior: Behavior normal.       Assessment/Plan: 1. NSTEMI (non-ST elevated myocardial infarction) (HCC) (Primary) No longer having CP, will follow up with cardiology on Friday as planned  2. Severe aortic valve stenosis Likely contributor to symptoms, will be seeing cardiology on Friday  3. Hospital discharge follow-up Reviewed hospital course  4. Essential hypertension Will start hydralazine  daily at low dose and titrate as needed. Dutan need BID in future. Will monitor, but want to avoid lows - hydrALAZINE  (APRESOLINE ) 10 MG tablet; Take 1 tablet (10 mg total) by mouth daily.  5. Stage 3b chronic kidney disease (HCC) Followed by nephrology  6. COPD with hypoxia (HCC) Followed by pulmonology, continue oxygen  as before and Kriesel continue on symbicort    General Counseling: Zayah verbalizes understanding of the findings of todays visit and agrees with plan of treatment. I have discussed any further diagnostic evaluation that [place] be needed or ordered today. We also reviewed her medications today. she has been encouraged to call the office with any questions or concerns that should arise related to todays visit.    Counseling:    No orders of the defined types were placed in this encounter.   This patient was seen by Tinnie Pro, PA-C in collaboration with Dr. Sigrid Bathe as a part of collaborative care agreement.   I have reviewed all medical records from hospital follow up including radiology reports and consults from other physicians. Appropriate follow up diagnostics will be scheduled as needed. Patient/ Family understands the plan of treatment. Time spent 40 minutes.   Dr Sigrid CHRISTELLA Bathe, MD Internal Medicine

## 2024-05-16 ENCOUNTER — Encounter: Payer: Self-pay | Admitting: Medical

## 2024-05-16 ENCOUNTER — Ambulatory Visit: Attending: Medical | Admitting: Medical

## 2024-05-16 VITALS — BP 150/62 | HR 72 | Ht <= 58 in | Wt 108.4 lb

## 2024-05-16 DIAGNOSIS — I10 Essential (primary) hypertension: Secondary | ICD-10-CM | POA: Diagnosis not present

## 2024-05-16 DIAGNOSIS — I350 Nonrheumatic aortic (valve) stenosis: Secondary | ICD-10-CM

## 2024-05-16 DIAGNOSIS — N1832 Chronic kidney disease, stage 3b: Secondary | ICD-10-CM

## 2024-05-16 DIAGNOSIS — E782 Mixed hyperlipidemia: Secondary | ICD-10-CM

## 2024-05-16 DIAGNOSIS — R7989 Other specified abnormal findings of blood chemistry: Secondary | ICD-10-CM

## 2024-05-16 DIAGNOSIS — I214 Non-ST elevation (NSTEMI) myocardial infarction: Secondary | ICD-10-CM

## 2024-05-16 DIAGNOSIS — R079 Chest pain, unspecified: Secondary | ICD-10-CM | POA: Diagnosis not present

## 2024-05-16 MED ORDER — HYDRALAZINE HCL 10 MG PO TABS: 10.0000 mg | ORAL_TABLET | Freq: Two times a day (BID) | ORAL | 3 refills | Status: AC

## 2024-05-16 MED ORDER — METOPROLOL TARTRATE 100 MG PO TABS: ORAL_TABLET | ORAL | 0 refills | Status: DC

## 2024-05-16 NOTE — Patient Instructions (Signed)
 Medication Instructions:  Your physician recommends the following medication changes.  INCREASE: Hydralazine  10 mg by mouth twice a day   *If you need a refill on your cardiac medications before your next appointment, please call your pharmacy*  Lab Work: Your provider would like for you to have following labs drawn today BMP.     Testing/Procedures:   Your cardiac CT will be scheduled at one of the below locations:   [facility] [address] [place], [place] [address] [phone]  There is spacious parking and easy access to the radiology department from the [facility] entrance. Please enter here and check-in with the desk attendant.   Please follow these instructions carefully (unless otherwise directed):  An IV will be required for this test and Nitroglycerin will be given.   On the Night Before the Test: Be sure to Drink plenty of water. Do not consume any caffeinated/decaffeinated beverages or chocolate 12 hours prior to your test. Do not take any antihistamines 12 hours prior to your test.  On the Day of the Test: Drink plenty of water until 1 hour prior to the test. Do not eat any food 1 hour prior to test. You Betty take your regular medications prior to the test.  Take metoprolol (Lopressor) 100 mg in addition to your carvedilol  two hours prior to test. If you take Furosemide /Hydrochlorothiazide /Spironolactone/Chlorthalidone, please HOLD on the morning of the test. Patients who wear a continuous glucose monitor MUST remove the device prior to scanning. FEMALES- please wear underwire-free bra if available, avoid dresses & tight clothing       After the Test: Drink plenty of water. After receiving IV contrast, you Proia experience a mild flushed feeling. This is normal. On occasion, you Pletz experience a mild rash up to 24 hours after the test. This is not dangerous. If this occurs, you can take Benadryl 25 mg, Zyrtec, Claritin,  or Allegra and increase your fluid intake. (Patients taking Tikosyn should avoid Benadryl, and Branham take Zyrtec, Claritin, or Allegra) If you experience trouble breathing, this can be serious. If it is severe call 911 IMMEDIATELY. If it is mild, please call our office.  We will call to schedule your test 2-4 weeks out understanding that some insurance companies will need an authorization prior to the service being performed.   For more information and frequently asked questions, please visit our website : [email or web address]  For non-scheduling related questions, please contact the cardiac imaging nurse navigator should you have any questions/concerns: Cardiac Imaging Nurse Navigators Direct Office Dial: [phone]   For scheduling needs, including cancellations and rescheduling, please call Brittany, [phone].    Follow-Up: At [facility], you and your health needs are our priority.  As part of our continuing mission to provide you with exceptional heart care, our providers are all part of one team.  This team includes your primary Cardiologist (physician) and Advanced Practice Providers or APPs (Physician Assistants and Nurse Practitioners) who all work together to provide you with the care you need, when you need it.  Your next appointment:   3 month(s)  Provider:   You Veselka see Redell Cave, MD or one of the following Advanced Practice Providers on your designated Care Team:   Cadence [place], PA-C

## 2024-05-16 NOTE — Progress Notes (Signed)
 Cardiology Office Note   Date:  05/20/2024  ID:  Ronal BRAVO Runkel, DOB October 27, 1933, MRN [id number] PCP: Kristina Tinnie POUR, PA-C  [facility] Providers Cardiologist:  Redell Cave, MD    History of Present Illness Brenda Tucker is a 89 y.o. female paroxysmal A-fib in the setting of Lexiscan  administration in 2022, diastolic dysfunction, mild to moderate aortic valve stenosis, CKD stage III, carotid artery stenosis, hypertension, hyperlipidemia, COPD without prior tobacco use who is being seen for hospital follow-up.  Ms. Yodice was first evaluated in our clinic 05/21/2020 for lower extremity edema, shortness of breath and RBBB.  Echocardiogram at that time showed normal LV function, moderate aortic annular calcification, mildly calcified aortic valve leaflets and borderline aortic stenosis.  Upon follow-up she reported continued exertional dyspnea with associated chest tightness.  She presented for Lexiscan  in January 2022.  During administration of Lexiscan  agent during the stress portion of the study she developed new onset atrial fibrillation with RVR with ventricular rates in the 130s.  She spontaneously converted to sinus rhythm 26 minutes into recovery.  Primary cardiologist was contacted recommend different anticoagulation given A-fib was noted with the administration of Lexiscan .  She underwent 14-day ZIO monitoring for further evaluations which did not demonstrate A-fib or other arrhythmias.  Upon office follow-up patient asked if she experienced palpitations while in A-fib but she denied frank palpitations reported chest tightness and increased dyspnea during episode.  Carotid artery stenosis on the last ultrasound completed in 07/2020 revealed <50% stenosis with moderate plaque formation bilaterally.  Echocardiogram December 2023 showed moderate aortic stenosis with a mean gradient of 14.7 mmHg.  LVEF 60 to 65%, no RWMA, G1 DD, normal RV size/function.  Mild to moderate MR, mild AI.  She was  last seen in clinic 12/12/2023 reporting that she had been doing well from a cardiac perspective.  Blood pressure had been stable in the 130s to 150s.  It was slightly elevated at her appointment.  She was scheduled for an updated echocardiogram.  There were no medication changes that were made at that time.  The patient presented to [facility]/2024 with chest pain relieved with 2 sprays of nitroglycerin.  High-sensitivity troponin trended 61, 101.  She was treated with IV heparin x 48 hours. Echo showed EF of 60 to 65%, grade 1 diastolic dysfunction, trivial MR, severe aortic stenosis Right and left heart cath was discussed, but patient was very hesitant.  Family wanted to consider all the options before making a decision.  .  Today, the aptient is overall feeling good. She is back at home and function is normal. She reports chronic and unchanged SOB. She denies chest pain. She denies lower leg edema, palpitations, lightheadedness, dizziness.  Blood pressures is a little high.   Studies Reviewed EKG Interpretation Date/Time:  Friday May 16 2024 13:58:39 EST Ventricular Rate:  72 PR Interval:  168 QRS Duration:  128 QT Interval:  414 QTC Calculation: 453 R Axis:   -79  Text Interpretation: Normal sinus rhythm Right bundle branch block Left anterior fascicular block Bifascicular block Minimal voltage criteria for LVH, Dudek be normal variant ( R in aVL ) Septal infarct , age undetermined When compared with ECG of 02-May-2024 13:35, PREVIOUS ECG IS PRESENT Confirmed by Franchester, Omar Orrego ([id number]) on 05/16/2024 2:06:48 PM    Echo 05/2024 1. Left ventricular ejection fraction, by estimation, is 60 to 65%. The  left ventricle has normal function. The left ventricle has no regional  wall motion abnormalities. There  is mild concentric left ventricular  hypertrophy. Left ventricular diastolic  parameters are consistent with Grade I diastolic dysfunction (impaired  relaxation). The average left  ventricular global longitudinal strain is  -16.7 %. The global longitudinal strain is normal.   2. Right ventricular systolic function is normal. The right ventricular  size is normal. There is normal pulmonary artery systolic pressure.   3. Left atrial size was mildly dilated.   4. The mitral valve is normal in structure. Trivial mitral valve  regurgitation. No evidence of mitral stenosis. Moderate mitral annular  calcification.   5. Aortic valve stenosis is severe by AVA and DI, moderate by  velocity/gradient but with low stroke volume, suspect this is low flow low  gradient. The aortic valve is calcified. There is moderate calcification  of the aortic valve. There is moderate  thickening of the aortic valve. Aortic valve regurgitation is mild.  moderate to severe. Aortic valve area, by VTI measures 0.60 cm. Aortic  valve mean gradient measures 28.6 mmHg. Aortic valve Vmax measures 3.59  m/s.   6. The inferior vena cava is normal in size with greater than 50%  respiratory variability, suggesting right atrial pressure of 3 mmHg.   Comparison(s): Changes from prior study are noted. Aortic stenosis has  progressed to severe compared to study from 2023.   Conclusion(s)/Recommendation(s): Low flow low gradient severe aortic  stenosis as noted.   Echo 2023  1. Left ventricular ejection fraction, by estimation, is 60 to 65%. The  left ventricle has normal function. The left ventricle has no regional  wall motion abnormalities. Left ventricular diastolic parameters are  consistent with Grade I diastolic  dysfunction (impaired relaxation).   2. Right ventricular systolic function is normal. The right ventricular  size is normal.   3. The mitral valve is normal in structure. Mild to moderate mitral valve  regurgitation. No evidence of mitral stenosis.   4. The aortic valve has an indeterminant number of cusps. There is  moderate calcification of the aortic valve. Aortic valve  regurgitation is  mild. Moderate aortic valve stenosis by Aortic valve area, by VTI measures  0.95 cm. Mild to moderate stenosis  by Aortic valve mean gradient measures 14.7 mmHg. Aortic valve Vmax  measures 2.70 m/s.   5. The inferior vena cava is normal in size with greater than 50%  respiratory variability, suggesting right atrial pressure of 3 mmHg.    Heart monitor 07/2020 Patch Wear Time:  13 days and 17 hours (2022-01-07T14:51:14-0500 to 2022-01-21T08:32:08-498)   Patient had a min HR of 52 bpm, max HR of 162 bpm, and avg HR of 62 bpm. Predominant underlying rhythm was Sinus Rhythm.  No evidence of atrial fibrillation or atrial flutter noted on cardiac monitor.   Myoview  lexiscan  07/2020 Narrative & Impression  Pharmacological myocardial perfusion imaging study with no significant  ischemia Normal wall motion, EF estimated at 88% No EKG changes concerning for ischemia at peak stress or in recovery. Patient developed atrial fibrillation with RVR in recovery, rate 140 bpm with associated chest tightness.  Converted back to normal sinus rhythm at 25 min in recovery without intervention CT attenuation correction images with moderate diffuse aortic atherosclerosis, moderate coronary calcification of the LAD and RCA Low risk scan     Signed, Velinda Lunger, MD, Ph.D [facility]    Echo 2021  1. Left ventricular ejection fraction, by estimation, is 55 to 60%. The  left ventricle has normal function. The left ventricle has no regional  wall motion abnormalities. Left ventricular diastolic parameters are  consistent with Grade II diastolic  dysfunction (pseudonormalization).   2. Right ventricular systolic function is normal. The right ventricular  size is normal.   3. The mitral valve is normal in structure. Trivial mitral valve  regurgitation. No evidence of mitral stenosis.   4. Aortic valve DVI = 0.33, mean Gradient 18, PG 29.. The aortic valve is  calcified. Aortic valve  regurgitation is not visualized. Mild to moderate  aortic valve stenosis.   5. The inferior vena cava is normal in size with <50% respiratory  variability, suggesting right atrial pressure of 8 mmHg.       Physical Exam VS:  BP (!) 150/62   Pulse 72   Ht 4' 8 (1.422 m)   Wt 108 lb 6.4 oz (49.2 kg)   SpO2 95%   BMI 24.30 kg/m        Wt Readings from Last 3 Encounters:  05/16/24 108 lb 6.4 oz (49.2 kg)  05/12/24 109 lb (49.4 kg)  05/02/24 115 lb (52.2 kg)    GEN: Well nourished, well developed in no acute distress NECK: No JVD; No carotid bruits CARDIAC: RRR, no murmurs, rubs, gallops RESPIRATORY:  Clear to auscultation without rales, wheezing or rhonchi  ABDOMEN: Soft, non-tender, non-distended EXTREMITIES:  No edema; No deformity   ASSESSMENT AND PLAN  NSTEMI Chest pain/elevated troponin Recent hospitalization for chest pain and elevated troponin treated medically with IV heparin.  Echo showed normal pump function with severe AS.  Right/left heart cath were discussed, but patient wanted to wait.  Patient is back at home and reports chronic shortness of breath.  She denies any chest pain.  We discussed repeat heart cath, and she is not wanting to pursue this.  We initially planned to do cardiac CTA, but decided not to pursue this due to CKD (GFR ~30). I will order a cardiac PET stress test.   HTN Blood pressure today is 150/62.  I will increase hydralazine  to 10 mg twice a day.  Continue amlodipine  2.5 mg daily and Coreg  12.5 mg twice daily.  Would not increase amlodipine  due to chronic lower leg edema.  HLD LDL 70.  Continue Lipitor 10 mg daily.  Aortic stenosis Recent echo showed normal pulm function with severe AS.  Plan to treat medically.  Continue Lasix  20 mg a day.  CKD stage 3 Most recent serum creatinine 1.59, GFR 31.       Dispo: Follow-up in 3 months  Signed, Aadhav Uhlig VEAR Fishman, PA-C

## 2024-05-17 LAB — BASIC METABOLIC PANEL WITH GFR
BUN/Creatinine Ratio: 13 (ref 12–28)
BUN: 20 mg/dL (ref 8–27)
CO2: 25 mmol/L (ref 20–29)
Calcium: 10.3 mg/dL (ref 8.7–10.3)
Chloride: 93 mmol/L — ABNORMAL LOW (ref 96–106)
Creatinine, Ser: 1.55 mg/dL — ABNORMAL HIGH (ref 0.57–1.00)
Glucose: 125 mg/dL — ABNORMAL HIGH (ref 70–99)
Potassium: 4.7 mmol/L (ref 3.5–5.2)
Sodium: 133 mmol/L — ABNORMAL LOW (ref 134–144)
eGFR: 32 mL/min/1.73 — ABNORMAL LOW (ref >59)

## 2024-05-19 ENCOUNTER — Ambulatory Visit: Payer: Self-pay | Admitting: Medical

## 2024-05-19 DIAGNOSIS — R072 Precordial pain: Secondary | ICD-10-CM

## 2024-05-20 ENCOUNTER — Inpatient Hospital Stay
Admission: EM | Admit: 2024-05-20 | Discharge: 2024-05-22 | DRG: 190 | Disposition: A | Discharge status: Home / Self Care | Attending: Hospitalist | Admitting: Hospitalist

## 2024-05-20 ENCOUNTER — Other Ambulatory Visit: Payer: Self-pay

## 2024-05-20 ENCOUNTER — Emergency Department

## 2024-05-20 DIAGNOSIS — N1832 Chronic kidney disease, stage 3b: Secondary | ICD-10-CM | POA: Diagnosis not present

## 2024-05-20 DIAGNOSIS — J441 Chronic obstructive pulmonary disease with (acute) exacerbation: Principal | ICD-10-CM | POA: Diagnosis present

## 2024-05-20 DIAGNOSIS — I2510 Atherosclerotic heart disease of native coronary artery without angina pectoris: Secondary | ICD-10-CM | POA: Diagnosis present

## 2024-05-20 DIAGNOSIS — R918 Other nonspecific abnormal finding of lung field: Secondary | ICD-10-CM | POA: Diagnosis present

## 2024-05-20 DIAGNOSIS — Z85828 Personal history of other malignant neoplasm of skin: Secondary | ICD-10-CM

## 2024-05-20 DIAGNOSIS — I160 Hypertensive urgency: Secondary | ICD-10-CM | POA: Diagnosis present

## 2024-05-20 DIAGNOSIS — I5032 Chronic diastolic (congestive) heart failure: Secondary | ICD-10-CM | POA: Diagnosis not present

## 2024-05-20 DIAGNOSIS — I200 Unstable angina: Secondary | ICD-10-CM

## 2024-05-20 DIAGNOSIS — R0602 Shortness of breath: Secondary | ICD-10-CM | POA: Diagnosis not present

## 2024-05-20 DIAGNOSIS — I214 Non-ST elevation (NSTEMI) myocardial infarction: Secondary | ICD-10-CM | POA: Diagnosis present

## 2024-05-20 DIAGNOSIS — Z90710 Acquired absence of both cervix and uterus: Secondary | ICD-10-CM

## 2024-05-20 DIAGNOSIS — E039 Hypothyroidism, unspecified: Secondary | ICD-10-CM | POA: Diagnosis not present

## 2024-05-20 DIAGNOSIS — E785 Hyperlipidemia, unspecified: Secondary | ICD-10-CM | POA: Diagnosis present

## 2024-05-20 DIAGNOSIS — I350 Nonrheumatic aortic (valve) stenosis: Secondary | ICD-10-CM | POA: Diagnosis present

## 2024-05-20 DIAGNOSIS — Z8249 Family history of ischemic heart disease and other diseases of the circulatory system: Secondary | ICD-10-CM

## 2024-05-20 DIAGNOSIS — Z888 Allergy status to other drugs, medicaments and biological substances status: Secondary | ICD-10-CM

## 2024-05-20 DIAGNOSIS — Z79899 Other long term (current) drug therapy: Secondary | ICD-10-CM

## 2024-05-20 DIAGNOSIS — Z801 Family history of malignant neoplasm of trachea, bronchus and lung: Secondary | ICD-10-CM

## 2024-05-20 DIAGNOSIS — Z808 Family history of malignant neoplasm of other organs or systems: Secondary | ICD-10-CM

## 2024-05-20 DIAGNOSIS — I130 Hypertensive heart and chronic kidney disease with heart failure and stage 1 through stage 4 chronic kidney disease, or unspecified chronic kidney disease: Secondary | ICD-10-CM | POA: Diagnosis present

## 2024-05-20 DIAGNOSIS — I10 Essential (primary) hypertension: Secondary | ICD-10-CM | POA: Diagnosis not present

## 2024-05-20 DIAGNOSIS — Z7951 Long term (current) use of inhaled steroids: Secondary | ICD-10-CM

## 2024-05-20 DIAGNOSIS — Z9889 Other specified postprocedural states: Secondary | ICD-10-CM

## 2024-05-20 DIAGNOSIS — Z860100 Personal history of colon polyps, unspecified: Secondary | ICD-10-CM

## 2024-05-20 DIAGNOSIS — Z9049 Acquired absence of other specified parts of digestive tract: Secondary | ICD-10-CM

## 2024-05-20 DIAGNOSIS — Z800 Family history of malignant neoplasm of digestive organs: Secondary | ICD-10-CM

## 2024-05-20 DIAGNOSIS — R069 Unspecified abnormalities of breathing: Secondary | ICD-10-CM | POA: Diagnosis not present

## 2024-05-20 DIAGNOSIS — I700 Atherosclerosis of aorta: Secondary | ICD-10-CM | POA: Diagnosis present

## 2024-05-20 DIAGNOSIS — Z66 Do not resuscitate: Secondary | ICD-10-CM | POA: Diagnosis present

## 2024-05-20 DIAGNOSIS — I452 Bifascicular block: Secondary | ICD-10-CM | POA: Diagnosis present

## 2024-05-20 DIAGNOSIS — Z79890 Hormone replacement therapy (postmenopausal): Secondary | ICD-10-CM

## 2024-05-20 DIAGNOSIS — R609 Edema, unspecified: Secondary | ICD-10-CM | POA: Diagnosis not present

## 2024-05-20 DIAGNOSIS — M1990 Unspecified osteoarthritis, unspecified site: Secondary | ICD-10-CM | POA: Diagnosis present

## 2024-05-20 LAB — BASIC METABOLIC PANEL WITH GFR
Anion gap: 10 (ref 5–15)
BUN: 22 mg/dL (ref 8–23)
CO2: 27 mmol/L (ref 22–32)
Calcium: 9.5 mg/dL (ref 8.9–10.3)
Chloride: 94 mmol/L — ABNORMAL LOW (ref 98–111)
Creatinine, Ser: 1.39 mg/dL — ABNORMAL HIGH (ref 0.44–1.00)
GFR, Estimated: 36 mL/min — ABNORMAL LOW (ref >60)
Glucose, Bld: 189 mg/dL — ABNORMAL HIGH (ref 70–99)
Potassium: 4.1 mmol/L (ref 3.5–5.1)
Sodium: 131 mmol/L — ABNORMAL LOW (ref 135–145)

## 2024-05-20 LAB — CBC
HCT: 34.1 % — ABNORMAL LOW (ref 36.0–46.0)
Hemoglobin: 11.4 g/dL — ABNORMAL LOW (ref 12.0–15.0)
MCH: 32.4 pg (ref 26.0–34.0)
MCHC: 33.4 g/dL (ref 30.0–36.0)
MCV: 96.9 fL (ref 80.0–100.0)
Platelets: 271 K/uL (ref 150–400)
RBC: 3.52 MIL/uL — ABNORMAL LOW (ref 3.87–5.11)
RDW: 12.6 % (ref 11.5–15.5)
WBC: 9.9 K/uL (ref 4.0–10.5)
nRBC: 0 % (ref 0.0–0.2)

## 2024-05-20 LAB — PROTIME-INR
INR: 1 (ref 0.8–1.2)
Prothrombin Time: 13.6 s (ref 11.4–15.2)

## 2024-05-20 LAB — TROPONIN T, HIGH SENSITIVITY
Troponin T High Sensitivity: 31 ng/L — ABNORMAL HIGH (ref 0–19)
Troponin T High Sensitivity: 31 ng/L — ABNORMAL HIGH (ref 0–19)

## 2024-05-20 LAB — PRO BRAIN NATRIURETIC PEPTIDE: Pro Brain Natriuretic Peptide: 1383 pg/mL — ABNORMAL HIGH (ref <300.0)

## 2024-05-20 LAB — APTT: aPTT: 27 s (ref 24–36)

## 2024-05-20 MED ORDER — HEPARIN SODIUM (PORCINE) 5000 UNIT/ML IJ SOLN
5000.0000 [IU] | Freq: Three times a day (TID) | INTRAMUSCULAR | Status: DC
  Administered 2024-05-20 – 2024-05-22 (×5): 5000 [IU] via SUBCUTANEOUS
  Filled 2024-05-20 (×6): qty 1

## 2024-05-20 MED ORDER — IOHEXOL 350 MG/ML SOLN
75.0000 mL | Freq: Once | INTRAVENOUS | Status: AC | PRN
  Administered 2024-05-20: 75 mL via INTRAVENOUS

## 2024-05-20 MED ORDER — METHYLPREDNISOLONE SODIUM SUCC 125 MG IJ SOLR: 80.0000 mg | Freq: Every day | INTRAMUSCULAR | Status: DC

## 2024-05-20 MED ORDER — LABETALOL HCL 5 MG/ML IV SOLN
10.0000 mg | Freq: Once | INTRAVENOUS | Status: AC
  Administered 2024-05-20: 10 mg via INTRAVENOUS
  Filled 2024-05-20: qty 4

## 2024-05-20 MED ORDER — PREDNISONE 50 MG PO TABS: 50.0000 mg | ORAL_TABLET | Freq: Every day | ORAL | 0 refills | Status: AC

## 2024-05-20 MED ORDER — NETARSUDIL-LATANOPROST 0.02-0.005 % OP SOLN
1.0000 [drp] | Freq: Every day | OPHTHALMIC | Status: DC
  Filled 2024-05-20: qty 0.1

## 2024-05-20 MED ORDER — FUROSEMIDE 10 MG/ML IJ SOLN
20.0000 mg | Freq: Once | INTRAMUSCULAR | Status: AC
  Administered 2024-05-20: 20 mg via INTRAVENOUS
  Filled 2024-05-20: qty 4

## 2024-05-20 MED ORDER — FUROSEMIDE 20 MG PO TABS
20.0000 mg | ORAL_TABLET | Freq: Every day | ORAL | Status: DC
  Administered 2024-05-21 – 2024-05-22 (×2): 20 mg via ORAL
  Filled 2024-05-20 (×2): qty 1

## 2024-05-20 MED ORDER — ALBUTEROL SULFATE HFA 108 (90 BASE) MCG/ACT IN AERS: 2.0000 | INHALATION_SPRAY | Freq: Four times a day (QID) | RESPIRATORY_TRACT | 2 refills | Status: AC | PRN

## 2024-05-20 MED ORDER — ACETAMINOPHEN 325 MG PO TABS: 650.0000 mg | ORAL_TABLET | Freq: Four times a day (QID) | ORAL | Status: DC | PRN

## 2024-05-20 MED ORDER — VITAMIN B-12 1000 MCG PO TABS
1000.0000 ug | ORAL_TABLET | Freq: Every day | ORAL | Status: DC
  Administered 2024-05-21 – 2024-05-22 (×2): 1000 ug via ORAL
  Filled 2024-05-20: qty 2
  Filled 2024-05-20: qty 1

## 2024-05-20 MED ORDER — CALCIUM CARBONATE 1250 (500 CA) MG PO TABS
500.0000 mg | ORAL_TABLET | Freq: Every day | ORAL | Status: DC
  Administered 2024-05-21 – 2024-05-22 (×2): 1250 mg via ORAL
  Filled 2024-05-20 (×3): qty 1

## 2024-05-20 MED ORDER — DM-GUAIFENESIN ER 30-600 MG PO TB12: 1.0000 | ORAL_TABLET | Freq: Two times a day (BID) | ORAL | Status: DC | PRN

## 2024-05-20 MED ORDER — IPRATROPIUM-ALBUTEROL 0.5-2.5 (3) MG/3ML IN SOLN
3.0000 mL | RESPIRATORY_TRACT | Status: DC
  Administered 2024-05-20 – 2024-05-21 (×5): 3 mL via RESPIRATORY_TRACT
  Filled 2024-05-20 (×7): qty 3

## 2024-05-20 MED ORDER — AMLODIPINE BESYLATE 5 MG PO TABS
2.5000 mg | ORAL_TABLET | Freq: Every day | ORAL | Status: DC
  Administered 2024-05-21 – 2024-05-22 (×2): 2.5 mg via ORAL
  Filled 2024-05-20 (×2): qty 1

## 2024-05-20 MED ORDER — LEVOTHYROXINE SODIUM 50 MCG PO TABS
50.0000 ug | ORAL_TABLET | Freq: Every day | ORAL | Status: DC
  Administered 2024-05-21 – 2024-05-22 (×2): 50 ug via ORAL
  Filled 2024-05-20 (×2): qty 1

## 2024-05-20 MED ORDER — IPRATROPIUM-ALBUTEROL 0.5-2.5 (3) MG/3ML IN SOLN
RESPIRATORY_TRACT | Status: AC
  Administered 2024-05-20: 3 mL via RESPIRATORY_TRACT
  Filled 2024-05-20: qty 3

## 2024-05-20 MED ORDER — ONDANSETRON HCL 4 MG/2ML IJ SOLN: 4.0000 mg | Freq: Three times a day (TID) | INTRAMUSCULAR | Status: DC | PRN

## 2024-05-20 MED ORDER — ATORVASTATIN CALCIUM 10 MG PO TABS
10.0000 mg | ORAL_TABLET | ORAL | Status: DC
  Administered 2024-05-22: 10 mg via ORAL
  Filled 2024-05-20: qty 1

## 2024-05-20 MED ORDER — CARVEDILOL 12.5 MG PO TABS
12.5000 mg | ORAL_TABLET | Freq: Two times a day (BID) | ORAL | Status: DC
  Administered 2024-05-21 – 2024-05-22 (×3): 12.5 mg via ORAL
  Filled 2024-05-20: qty 2
  Filled 2024-05-20 (×2): qty 1

## 2024-05-20 MED ORDER — VITAMIN D 25 MCG (1000 UNIT) PO TABS
2000.0000 [IU] | ORAL_TABLET | Freq: Every day | ORAL | Status: DC
  Administered 2024-05-21 – 2024-05-22 (×2): 2000 [IU] via ORAL
  Filled 2024-05-20 (×2): qty 2

## 2024-05-20 MED ORDER — HYDRALAZINE HCL 20 MG/ML IJ SOLN: 5.0000 mg | INTRAMUSCULAR | Status: DC | PRN

## 2024-05-20 MED ORDER — ALBUTEROL SULFATE (2.5 MG/3ML) 0.083% IN NEBU: 2.5000 mg | INHALATION_SOLUTION | RESPIRATORY_TRACT | Status: DC | PRN

## 2024-05-20 MED ORDER — HYDRALAZINE HCL 10 MG PO TABS
10.0000 mg | ORAL_TABLET | Freq: Two times a day (BID) | ORAL | Status: DC
  Administered 2024-05-20 – 2024-05-22 (×4): 10 mg via ORAL
  Filled 2024-05-20 (×5): qty 1

## 2024-05-20 NOTE — ED Triage Notes (Signed)
 Pt to ED via [facility] from home for respiratory distress. Pt woke up with SOB that has worsened throughout the day. Pt SPO2 was 93% on RA and audibly wheezing. Pt has hx of COPD. Pt wears O2 as needed at home. Denies chest pain.   HR 60 BP 150/68 SPO2 100% on neb   2g mag  125 solu medrol  1 albuterol  2 duonebs

## 2024-05-20 NOTE — ED Provider Notes (Signed)
 [facility] Provider Note    Event Date/Time   First MD Initiated Contact with Patient 05/20/24 1615     (approximate)   History   Shortness of Breath   HPI  Brenda Tucker is a 89 y.o. female who presents today with concern of shortness of breath.  Prior history of COPD and asthma, generally uses oxygen  for assistance as needed, today with new onset shortness of breath that apparently began earlier this morning.  When EMS found patient, apparently winded and short of breath with diffuse wheezes.  She was given nebulizers as well as magnesium  and steroids enroute.  Since then her symptoms have significantly improved and she feels back to baseline.  Denies any chest pain affiliated with her symptoms no lightheadedness.  She does live by herself, she denies having a nebulizer at home.  Chart review does demonstrate that she has a pulmonologist that she follows with most recently seeing them about 1 week ago for follow-up after a recent admission.  She was recently admitted for an NSTEMI, at that time it seems that she was offered a catheterization with cardiology, but she declined.     Physical Exam   Triage Vital Signs: ED Triage Vitals  Encounter Vitals Group     BP 05/20/24 1606 (!) 185/65     Girls Systolic BP Percentile --      Girls Diastolic BP Percentile --      Boys Systolic BP Percentile --      Boys Diastolic BP Percentile --      Pulse Rate 05/20/24 1606 65     Resp 05/20/24 1606 17     Temp 05/20/24 1610 97.7 F (36.5 C)     Temp Source 05/20/24 1610 Oral     SpO2 05/20/24 1606 100 %     Weight 05/20/24 1605 100 lb 12 oz (45.7 kg)     Height 05/20/24 1605 4' 8 (1.422 m)     Head Circumference --      Peak Flow --      Pain Score 05/20/24 1605 0     Pain Loc --      Pain Education --      Exclude from Growth Chart --     Most recent vital signs: Vitals:   05/20/24 2112 05/20/24 2323  BP: (!) 158/52 (!) 150/55  Pulse: 65   Resp: (!) 24    Temp:    SpO2: 100%      General: Awake, no distress.  CV:  Good peripheral perfusion.  Resp:  Normal effort.  Speaking in full sentences, faint end expiratory wheezes are appreciated, no acute respiratory distress Abd:  No distention.  Soft nontender Other:     ED Results / Procedures / Treatments   Labs (all labs ordered are listed, but only abnormal results are displayed) Labs Reviewed  BASIC METABOLIC PANEL WITH GFR - Abnormal; Notable for the following components:      Result Value   Sodium 131 (*)    Chloride 94 (*)    Glucose, Bld 189 (*)    Creatinine, Ser 1.39 (*)    GFR, Estimated 36 (*)    All other components within normal limits  CBC - Abnormal; Notable for the following components:   RBC 3.52 (*)    Hemoglobin 11.4 (*)    HCT 34.1 (*)    All other components within normal limits  PRO BRAIN NATRIURETIC PEPTIDE - Abnormal; Notable for the following  components:   Pro Brain Natriuretic Peptide 1,383.0 (*)    All other components within normal limits  TROPONIN T, HIGH SENSITIVITY - Abnormal; Notable for the following components:   Troponin T High Sensitivity 31 (*)    All other components within normal limits  TROPONIN T, HIGH SENSITIVITY - Abnormal; Notable for the following components:   Troponin T High Sensitivity 31 (*)    All other components within normal limits  RESPIRATORY PANEL BY PCR  EXPECTORATED SPUTUM ASSESSMENT W GRAM STAIN, RFLX TO RESP C  PROTIME-INR  APTT  BASIC METABOLIC PANEL WITH GFR  CBC     EKG  Sinus rhythm with rate of about 65, axis of -80, there is a right bundle branch block present as well as a left anterior fascicular block, with a slightly widened QRS, remaining intervals appear to be within normal limits and no obvious ischemia.   RADIOLOGY No acute findings on chest x-ray  PROCEDURES:  Critical Care performed: No  Procedures   MEDICATIONS ORDERED IN ED: Medications  ipratropium-albuterol  (DUONEB) 0.5-2.5 (3)  MG/3ML nebulizer solution 3 mL (3 mLs Nebulization Given 05/20/24 2323)  albuterol  (PROVENTIL ) (2.5 MG/3ML) 0.083% nebulizer solution 2.5 mg (has no administration in time range)  dextromethorphan-guaiFENesin (MUCINEX DM) 30-600 MG per 12 hr tablet 1 tablet (has no administration in time range)  ondansetron  (ZOFRAN ) injection 4 mg (has no administration in time range)  hydrALAZINE  (APRESOLINE ) injection 5 mg (has no administration in time range)  acetaminophen  (TYLENOL ) tablet 650 mg (has no administration in time range)  heparin injection 5,000 Units (5,000 Units Subcutaneous Given 05/20/24 2325)  amLODipine  (NORVASC ) tablet 2.5 mg (has no administration in time range)  atorvastatin  (LIPITOR) tablet 10 mg (has no administration in time range)  carvedilol  (COREG ) tablet 12.5 mg (has no administration in time range)  furosemide  (LASIX ) tablet 20 mg (has no administration in time range)  hydrALAZINE  (APRESOLINE ) tablet 10 mg (10 mg Oral Given 05/20/24 2323)  levothyroxine  (SYNTHROID ) tablet 50 mcg (has no administration in time range)  cyanocobalamin (VITAMIN B12) tablet 1,000 mcg (has no administration in time range)  calcium  carbonate (OS-CAL - dosed in mg of elemental calcium ) tablet 1,250 mg (has no administration in time range)  cholecalciferol (VITAMIN D3) 25 MCG (1000 UNIT) tablet 2,000 Units (has no administration in time range)  Netarsudil-Latanoprost 0.02-0.005 % SOLN 1 drop (has no administration in time range)  methylPREDNISolone sodium succinate (SOLU-MEDROL) 125 mg/2 mL injection 80 mg (has no administration in time range)  iohexol (OMNIPAQUE) 350 MG/ML injection 75 mL (75 mLs Intravenous Contrast Given 05/20/24 1825)  labetalol (NORMODYNE) injection 10 mg (10 mg Intravenous Given 05/20/24 1956)  furosemide  (LASIX ) injection 20 mg (20 mg Intravenous Given 05/20/24 2323)     IMPRESSION / MDM / ASSESSMENT AND PLAN / ED COURSE  I reviewed the triage vital signs and the nursing  notes.                               Patient's presentation is most consistent with acute complicated illness / injury requiring diagnostic workup.  89 year old female who presents today with concern of shortness of breath which developed earlier today and was found to have diffuse wheezes which are now improved.  She feels back to baseline at this time and she has no complaints.  She does have a history of aortic stenosis and was recently admitted for an NSTEMI it seems, today's episode seems much  more likely related with COPD exacerbation given the presence of her wheezes and the significant improvement in response to the steroids and nebulizer treatment.  Discussed with the patient I did consider admission however given the significant improvement in his symptoms I believe reasonable for discharge home with outpatient follow-up.  I will give her an oral inhaler for home as she does not have 1, and will recommend following up with her pulmonologist to discuss possibly a nebulizer.  Discussed return precautions with family and patient they are agreeable with the plan.   Clinical Course as of 05/20/24 2327  Tue May 20, 2024  1747 Patient able to ambulate without significant distress, she feels back to baseline and is comfortable with discharge. [SK]  1811 Were getting ready to discharge to patient but she feels like symptoms are recurring, she is making moaning sounds but her lungs are clear to auscultation otherwise aside from what seems to be upper airway sounds from the moaning.  She is very hypertensive here and tachypneic, I am adding on a troponin, I will obtain CT imaging of the chest to look for any aortic pathology given the significant hypertension here but no clear source that would explain her symptoms at this time.  Possibly ACS given her recent NSTEMI. [SK]  1905 Troponin here initially of 31, I reviewed the charts, and during her recent admission her troponin actually went from 60 to  about 100.  BNP here is about 1300.  She is fairly hypertensive here which could be concerning for hypertensive emergency versus urgency which is more likely however given her persistent shortness of breath and other ongoing symptoms likely warrants admission.  During her prior visit I reviewed the cardiology note and she was offered a cath but she declined.  I talked with her again about this today, and she states that she would be amenable to it if she was offered now.  I will discuss with medicine for admission will give her a dose of medications as well to try and get blood pressure better controlled. [SK]  2010 Spoke with Dr. Hilma from medicine team who has agreed to evaluate the patient to determine course of further medical management. [SK]    Clinical Course User Index [SK] Fernand Rossie HERO, MD     FINAL CLINICAL IMPRESSION(S) / ED DIAGNOSES   Final diagnoses:  COPD exacerbation (HCC)  Unstable angina (HCC)  Hypertensive urgency     Rx / DC Orders   ED Discharge Orders          Ordered    albuterol  (VENTOLIN  HFA) 108 (90 Base) MCG/ACT inhaler  Every 6 hours PRN        05/20/24 1754    predniSONE (DELTASONE) 50 MG tablet  Daily with breakfast        05/20/24 1754             Note:  This document was prepared using Dragon voice recognition software and [employer] include unintentional dictation errors.   Fernand Rossie HERO, MD 05/20/24 [phone]

## 2024-05-20 NOTE — ED Notes (Signed)
 Pt ambulatory with this RN. SPO2 maintained at 99% during ambulation. Pt ambulated without assistance with steady gait out of room into hallway. Pt reports feeling normal.

## 2024-05-20 NOTE — H&P (Signed)
 History and Physical    Brenda Tucker Brenda Tucker FMW:[id number] DOB: 12-10-1933 DOA: 05/20/2024  Referring MD/NP/PA:   PCP: Kristina Tinnie POUR, PA-C   Patient coming from:  The patient is coming from home.     Chief Complaint: SOB  HPI: Brenda Tucker is a 89 y.o. female with medical history significant of COPD on 2L of O2 as needed, CAD, recent NSTEMI, HTN, HLD, dCHF, CKD-3b, hypothyroidism, easy to bruise (off ASA), who presents with SOB.  Patient was recently hospitalized from 10/31 - 11/3 due to non-STEMI troponin with trop 61 --> 101.  Patient was treated with IV heparin.  Patient refused cardiac cath.  2D echo showed severe aortic stenosis and grade 1 diastolic CHF.  Patient states that her shortness of breath has been progressively worsening today.  She has dry cough, no chest pain, fever or chills.  No nausea, vomiting, diarrhea or abdominal pain.  No symptoms of UTI.  No fever or chills.  Per EMS report, patient was found to have audible wheezing.  Oxygen  saturation 93% on room air.  2 g magnesium  sulfate, 125 mg of Solu-Medrol and nebulizer were given, with some improvement.  Data reviewed independently and ED Course: pt was found to have WBC 9.9, stable renal function, proBNP 1383, troponins 31.  Temperature normal, blood pressure 190/58 --> 169/80, heart rate 60-80s, RR 28 -> 19. oxygen  saturation 99% on room air currently.  Chest x-ray negative.  CTA negative for PE.  Patient is placed in telemetry bed of observation.  CTA of chest aorta: 1. No evidence of pulmonary embolism. 2. Mild posteromedial right middle lobe scarring and/or atelectasis. 3. Multiple right-sided pulmonary nodules, as described above. Non-contrast chest CT at 3-6 months is recommended. If the nodules are stable at time of repeat CT, then future CT at 18-24 months (from today's scan) is considered optional for low-risk patients, but is recommended for high-risk patients. This recommendation follows the consensus  statement: Guidelines for Management of Incidental Pulmonary Nodules Detected on CT Images: From the Fleischner Society 2017; Radiology 2017; 284:228-243. 4. Marked severity coronary artery calcification. 5. Atrophic right kidney. 6. Aortic atherosclerosis.   EKG: I have personally reviewed.  Sinus rhythm, QTc 484, bifascicular block, poor R wave progression.   Review of Systems:   General: no fevers, chills, no body weight gain, has fatigue HEENT: no blurry vision, hearing changes or sore throat Respiratory: has dyspnea, coughing, wheezing CV: no chest pain, no palpitations GI: no nausea, vomiting, abdominal pain, diarrhea, constipation GU: no dysuria, burning on urination, increased urinary frequency, hematuria  Ext: has trace leg edema Neuro: no unilateral weakness, numbness, or tingling, no vision change or hearing loss Skin: no rash, no skin tear. MSK: No muscle spasm, no deformity, no limitation of range of movement in spin Heme: No easy bruising.  Travel history: No recent long distant travel.   Allergy:  Allergies  Allergen Reactions   Theodrenaline     Shaky, nervous    Past Medical History:  Diagnosis Date   Arthritis    Asthma    Cancer (HCC)    skin, colon polyp   Chronic kidney disease    Complication of anesthesia    asthma attack per pt   COPD (chronic obstructive pulmonary disease) (HCC)    Dyspnea    Hypertension    Hypothyroidism     Past Surgical History:  Procedure Laterality Date   ABDOMINAL HYSTERECTOMY     APPENDECTOMY     CHOLECYSTECTOMY N/A 03/12/2020  Procedure: LAPAROSCOPIC CHOLECYSTECTOMY;  Surgeon: Marolyn Nest, MD;  Location: [facility] ORS;  Service: General;  Laterality: N/A;   COLONOSCOPY WITH PROPOFOL  N/A 07/09/2015   Procedure: COLONOSCOPY WITH PROPOFOL ;  Surgeon: Lamar ONEIDA Holmes, MD;  Location: [facility] ENDOSCOPY;  Service: Endoscopy;  Laterality: N/A;    Social History:  reports that she has never smoked. She has never used  smokeless tobacco. She reports that she does not drink alcohol and does not use drugs.  Family History:  Family History  Problem Relation Age of Onset   Heart attack Father    Early death Sister        MVA   Stomach cancer Brother    Cancer Brother        shoulder   Lung cancer Brother    Cancer Sister    Cancer Sister    Early death Sister        husband killed her   Breast cancer Neg Hx      Prior to Admission medications   Medication Sig Start Date End Date Taking? Authorizing Provider  albuterol  (VENTOLIN  HFA) 108 (90 Base) MCG/ACT inhaler Inhale 2 puffs into the lungs every 6 (six) hours as needed for wheezing or shortness of breath. 05/20/24  Yes Fernand Rossie HERO, MD  predniSONE  (DELTASONE ) 50 MG tablet Take 1 tablet (50 mg total) by mouth daily with breakfast for 5 days. 05/20/24 05/25/24 Yes Fernand Rossie HERO, MD  acetaminophen  (TYLENOL ) 500 MG tablet Take 500 mg by mouth every 6 (six) hours as needed for moderate pain or headache.    [provider]  amLODipine  (NORVASC ) 2.5 MG tablet Take 1 tablet (2.5 mg total) by mouth daily. 12/17/23   McDonough, Lauren K, PA-C  atorvastatin  (LIPITOR) 10 MG tablet TAKE 1 TABLET BY MOUTH EVERY OTHER DAY 03/04/24   McDonough, Lauren K, PA-C  budesonide -formoterol  (SYMBICORT ) 160-4.5 MCG/ACT inhaler Inhale 2 puffs into the lungs 2 (two) times daily. 08/16/23   Liana Fish, NP  calcium  carbonate (OSCAL) 1500 (600 Ca) MG TABS tablet Take 600 mg of elemental calcium  by mouth daily.    [provider]  carvedilol  (COREG ) 25 MG tablet TAKE 1/2 TABLET (12.5 MG) BY MOUTH TWICE DAILY WITH A MEAL 03/04/24   McDonough, Lauren K, PA-C  Cholecalciferol  (VITAMIN D ) 50 MCG (2000 UT) tablet Take 2,000 Units by mouth daily.    [provider]  furosemide  (LASIX ) 20 MG tablet TAKE 1 TABLET (20MG ) BY MOUTH DAILY 10/22/23   Liana Fish, NP  hydrALAZINE  (APRESOLINE ) 10 MG tablet Take 1 tablet (10 mg total) by mouth 2 (two) times  daily. 05/16/24   Furth, Cadence H, PA-C  levothyroxine  (SYNTHROID ) 50 MCG tablet TAKE 1 TABLET (50 MCG) BY MOUTH DAILY BEFORE BREAKFAST 07/19/23   McDonough, Lauren K, PA-C  metoprolol  tartrate (LOPRESSOR ) 100 MG tablet TAKE 1 TABLET 2 HR PRIOR TO CARDIAC PROCEDURE 05/16/24   Furth, Cadence H, PA-C  OXYGEN  Inhale into the lungs. 2 litre AHP    [provider]  ROCKLATAN  0.02-0.005 % SOLN Place 1 drop into both eyes at bedtime. 06/08/23   [provider]  vitamin B-12 (CYANOCOBALAMIN ) 1000 MCG tablet Take 1,000 mcg by mouth daily.    [provider]  fluticasone -salmeterol (ADVAIR DISKUS) 250-50 MCG/ACT AEPB INHALE 1 PUFF INTO THE LUNGS TWICE DAILY 03/28/21 08/08/21  Fernand Elfreda LABOR, MD    Physical Exam: Vitals:   05/20/24 1800 05/20/24 1815 05/20/24 1843 05/20/24 1940  BP: (!) 173/112   (!) 169/80  Pulse: 73 73 74 80  Resp: (!) 28 (!) 22 20 19   Temp:    97.9 F (36.6 C)  TempSrc:    Oral  SpO2: 100% 100% 100% 99%  Weight:      Height:       General: Not in acute distress HEENT:       Eyes: PERRL, EOMI, no jaundice       ENT: No discharge from the ears and nose, no pharynx injection, no tonsillar enlargement.        Neck: No JVD, no bruit, no mass felt. Heme: No neck lymph node enlargement. Cardiac: S1/S2, RRR, 2/5 systolic murmurs, No gallops or rubs. Respiratory: has wheezing and rhonchi bilaterally GI: Soft, nondistended, nontender, no rebound pain, no organomegaly, BS present. GU: No hematuria Ext:  has trace leg edema bilaterally. 1+DP/PT pulse bilaterally. Musculoskeletal: No joint deformities, No joint redness or warmth, no limitation of ROM in spin. Skin: No rashes.  Neuro: Alert, oriented X3, cranial nerves II-XII grossly intact, moves all extremities normally. Psych: Patient is not psychotic, no suicidal or hemocidal ideation.  Labs on Admission: I have personally reviewed following labs and imaging studies  CBC: Recent Labs  Lab 05/20/24 1611   WBC 9.9  HGB 11.4*  HCT 34.1*  MCV 96.9  PLT 271   Basic Metabolic Panel: Recent Labs  Lab 05/16/24 1441 05/20/24 1611  NA 133* 131*  K 4.7 4.1  CL 93* 94*  CO2 25 27  GLUCOSE 125* 189*  BUN 20 22  CREATININE 1.55* 1.39*  CALCIUM  10.3 9.5   GFR: Estimated Creatinine Clearance: 17.7 mL/min (A) (by C-G formula based on SCr of 1.39 mg/dL (H)). Liver Function Tests: No results for input(s): AST, ALT, ALKPHOS, BILITOT, PROT, ALBUMIN in the last 168 hours. No results for input(s): LIPASE, AMYLASE in the last 168 hours. No results for input(s): AMMONIA in the last 168 hours. Coagulation Profile: No results for input(s): INR, PROTIME in the last 168 hours. Cardiac Enzymes: No results for input(s): CKTOTAL, CKMB, CKMBINDEX, TROPONINI in the last 168 hours. BNP (last 3 results) Recent Labs    05/20/24 1815  PROBNP 1,383.0*   HbA1C: No results for input(s): HGBA1C in the last 72 hours. CBG: No results for input(s): GLUCAP in the last 168 hours. Lipid Profile: No results for input(s): CHOL, HDL, LDLCALC, TRIG, CHOLHDL, LDLDIRECT in the last 72 hours. Thyroid  Function Tests: No results for input(s): TSH, T4TOTAL, FREET4, T3FREE, THYROIDAB in the last 72 hours. Anemia Panel: No results for input(s): VITAMINB12, FOLATE, FERRITIN, TIBC, IRON, RETICCTPCT in the last 72 hours. Urine analysis:    Component Value Date/Time   COLORURINE YELLOW (A) 05/02/2024 1722   APPEARANCEUR CLOUDY (A) 05/02/2024 1722   APPEARANCEUR Clear 01/11/2023 1549   LABSPEC 1.005 05/02/2024 1722   PHURINE 8.0 05/02/2024 1722   GLUCOSEU NEGATIVE 05/02/2024 1722   HGBUR NEGATIVE 05/02/2024 1722   BILIRUBINUR NEGATIVE 05/02/2024 1722   BILIRUBINUR Negative 01/11/2023 1549   KETONESUR NEGATIVE 05/02/2024 1722   PROTEINUR 30 (A) 05/02/2024 1722   UROBILINOGEN 0.2 12/19/2018 1056   NITRITE NEGATIVE 05/02/2024 1722   LEUKOCYTESUR LARGE  (A) 05/02/2024 1722   Sepsis Labs: @LABRCNTIP (procalcitonin:4,lacticidven:4) )No results found for this or any previous visit (from the past 240 hours).   Radiological Exams on Admission:   Assessment/Plan Principal Problem:   COPD exacerbation (HCC) Active Problems:   Chronic diastolic CHF (congestive heart failure) (HCC)   Severe aortic stenosis   NSTEMI (non-ST elevated myocardial infarction) (  HCC)   CAD (coronary artery disease)   Hypothyroidism   Hypertension   Hyperlipidemia   CKD stage 3b, GFR 30-44 ml/min (HCC)   Lung nodules   Assessment and Plan:   COPD exacerbation [facility]): Patient has SOB, dry cough, wheezing and rhonchi on auscultation, clinically consistent with COPD exacerbation.  -Place in tele bed for obs -Bronchodilators and prn Mucinex  -Solu-Medrol  80 mg IV daily after given 125 mg of Solu-Medrol  by EMS - will hold off antibiotics since patient does not have productive cough, no fever or leukocytosis. -Incentive spirometry -check RVP -Follow up sputum culture if produces sputum  Chronic diastolic CHF (congestive heart failure) and severe aortic stenosis: 2D echo on 05/03/2024 showed EF of 60 to 65% with grade 1 diastolic dysfunction.  Patient only has trace leg edema, no pulm edema chest x-ray, does not seem to have CHF exacerbation, but proBNP is elevated 1383, patient is at risk of developing CHF exacerbation. - Give 20 mg of IV Lasix  x 1 dose - Continue home oral Lasix  20 mg daily  Recent NSTEMI (non-ST elevated myocardial infarction) and CAD: pt had trop 61 --> 101 on 05/02/24. Today her trop is 31, no CP.  Likely demand ischemia.  A1c 5.9 and LDL of 70 on 05/03/2024. - Patient refused aspirin due to easy bruising - Continue Lipitor - Trend troponin  Hypothyroidism -Synthroid   Hypertension -IV hydralazine  as needed - Received 10 mg of labetalol  in ED - Continue home amlodipine , Coreg , oral hydroxyzine - Patient is also on  Lasix   Hyperlipidemia -Lipitor  CKD stage 3b, GFR 30-44 ml/min [facility]): Renal function stable.  Recent baseline creatinine 1.55 on 05/16/2024.  Her creatinine is 1.39, BUN 22, GFR 36. -Follow-up with BMP  Lung nodules: These are incidental findings by CTA. -need to f/u with PCP as outpt to repeat CT-lung in 3-6 months      DVT ppx: SQ Heparin      Code Status: DNR per pt and her son  Family Communication: Yes, patient's son and daughter-in-law at bed side.     Disposition Plan:  Anticipate discharge back to previous environment  Consults called: None  Admission status and Level of care: Telemetry:    for obs     Dispo: The patient is from: Home              Anticipated d/c is to: Home              Anticipated d/c date is: 1 day              Patient currently is not medically stable to d/c.    Severity of Illness:  The appropriate patient status for this patient is OBSERVATION. Observation status is judged to be reasonable and necessary in order to provide the required intensity of service to ensure the patient's safety. The patient's presenting symptoms, physical exam findings, and initial radiographic and laboratory data in the context of their medical condition is felt to place them at decreased risk for further clinical deterioration. Furthermore, it is anticipated that the patient will be medically stable for discharge from the hospital within 2 midnights of admission.        Date of Service 05/20/2024    Caleb Exon [facility]   If 7PM-7AM, please contact night-coverage [email or web address] 05/20/2024, 9:07 PM

## 2024-05-20 NOTE — Discharge Instructions (Signed)
 You were seen today due to concern of shortness of breath.  At this time I have written for a inhaler for you to take, please take this as instructed, I will also written for a brief course of steroids for you to take, please take these as instructed.  If you have any worsening symptom such as increased shortness of breath, chest pain, lightheadedness, or any other symptoms you find concerning please return to the emergency department immediately for further medical management.

## 2024-05-20 NOTE — ED Notes (Signed)
 Pt assisted to restroom and back to bed. Denies any needs at this time, NAD noted, call bell within reach, family at bedside

## 2024-05-21 DIAGNOSIS — R918 Other nonspecific abnormal finding of lung field: Secondary | ICD-10-CM | POA: Diagnosis not present

## 2024-05-21 DIAGNOSIS — I700 Atherosclerosis of aorta: Secondary | ICD-10-CM | POA: Diagnosis not present

## 2024-05-21 DIAGNOSIS — I5032 Chronic diastolic (congestive) heart failure: Secondary | ICD-10-CM | POA: Diagnosis not present

## 2024-05-21 DIAGNOSIS — Z801 Family history of malignant neoplasm of trachea, bronchus and lung: Secondary | ICD-10-CM | POA: Diagnosis not present

## 2024-05-21 DIAGNOSIS — N1832 Chronic kidney disease, stage 3b: Secondary | ICD-10-CM | POA: Diagnosis not present

## 2024-05-21 DIAGNOSIS — R0603 Acute respiratory distress: Secondary | ICD-10-CM | POA: Diagnosis not present

## 2024-05-21 DIAGNOSIS — R0602 Shortness of breath: Secondary | ICD-10-CM | POA: Diagnosis not present

## 2024-05-21 DIAGNOSIS — Z7951 Long term (current) use of inhaled steroids: Secondary | ICD-10-CM | POA: Diagnosis not present

## 2024-05-21 DIAGNOSIS — I214 Non-ST elevation (NSTEMI) myocardial infarction: Secondary | ICD-10-CM | POA: Diagnosis not present

## 2024-05-21 DIAGNOSIS — Z79890 Hormone replacement therapy (postmenopausal): Secondary | ICD-10-CM | POA: Diagnosis not present

## 2024-05-21 DIAGNOSIS — Z90710 Acquired absence of both cervix and uterus: Secondary | ICD-10-CM | POA: Diagnosis not present

## 2024-05-21 DIAGNOSIS — Z66 Do not resuscitate: Secondary | ICD-10-CM | POA: Diagnosis not present

## 2024-05-21 DIAGNOSIS — Z860100 Personal history of colon polyps, unspecified: Secondary | ICD-10-CM | POA: Diagnosis not present

## 2024-05-21 DIAGNOSIS — Z888 Allergy status to other drugs, medicaments and biological substances status: Secondary | ICD-10-CM | POA: Diagnosis not present

## 2024-05-21 DIAGNOSIS — J441 Chronic obstructive pulmonary disease with (acute) exacerbation: Secondary | ICD-10-CM | POA: Diagnosis not present

## 2024-05-21 DIAGNOSIS — Z85828 Personal history of other malignant neoplasm of skin: Secondary | ICD-10-CM | POA: Diagnosis not present

## 2024-05-21 DIAGNOSIS — E785 Hyperlipidemia, unspecified: Secondary | ICD-10-CM | POA: Diagnosis not present

## 2024-05-21 DIAGNOSIS — I2510 Atherosclerotic heart disease of native coronary artery without angina pectoris: Secondary | ICD-10-CM | POA: Diagnosis not present

## 2024-05-21 DIAGNOSIS — I350 Nonrheumatic aortic (valve) stenosis: Secondary | ICD-10-CM | POA: Diagnosis not present

## 2024-05-21 DIAGNOSIS — I200 Unstable angina: Secondary | ICD-10-CM | POA: Diagnosis not present

## 2024-05-21 DIAGNOSIS — Z800 Family history of malignant neoplasm of digestive organs: Secondary | ICD-10-CM | POA: Diagnosis not present

## 2024-05-21 DIAGNOSIS — M1990 Unspecified osteoarthritis, unspecified site: Secondary | ICD-10-CM | POA: Diagnosis not present

## 2024-05-21 DIAGNOSIS — Z8249 Family history of ischemic heart disease and other diseases of the circulatory system: Secondary | ICD-10-CM | POA: Diagnosis not present

## 2024-05-21 DIAGNOSIS — I160 Hypertensive urgency: Secondary | ICD-10-CM | POA: Diagnosis not present

## 2024-05-21 DIAGNOSIS — I130 Hypertensive heart and chronic kidney disease with heart failure and stage 1 through stage 4 chronic kidney disease, or unspecified chronic kidney disease: Secondary | ICD-10-CM | POA: Diagnosis not present

## 2024-05-21 DIAGNOSIS — E039 Hypothyroidism, unspecified: Secondary | ICD-10-CM | POA: Diagnosis not present

## 2024-05-21 DIAGNOSIS — I452 Bifascicular block: Secondary | ICD-10-CM | POA: Diagnosis not present

## 2024-05-21 DIAGNOSIS — Z79899 Other long term (current) drug therapy: Secondary | ICD-10-CM | POA: Diagnosis not present

## 2024-05-21 LAB — CBC
HCT: 33.7 % — ABNORMAL LOW (ref 36.0–46.0)
Hemoglobin: 11.2 g/dL — ABNORMAL LOW (ref 12.0–15.0)
MCH: 32.2 pg (ref 26.0–34.0)
MCHC: 33.2 g/dL (ref 30.0–36.0)
MCV: 96.8 fL (ref 80.0–100.0)
Platelets: 284 K/uL (ref 150–400)
RBC: 3.48 MIL/uL — ABNORMAL LOW (ref 3.87–5.11)
RDW: 12.3 % (ref 11.5–15.5)
WBC: 5.4 K/uL (ref 4.0–10.5)
nRBC: 0 % (ref 0.0–0.2)

## 2024-05-21 LAB — BASIC METABOLIC PANEL WITH GFR
Anion gap: 11 (ref 5–15)
BUN: 23 mg/dL (ref 8–23)
CO2: 24 mmol/L (ref 22–32)
Calcium: 9.1 mg/dL (ref 8.9–10.3)
Chloride: 96 mmol/L — ABNORMAL LOW (ref 98–111)
Creatinine, Ser: 1.57 mg/dL — ABNORMAL HIGH (ref 0.44–1.00)
GFR, Estimated: 31 mL/min — ABNORMAL LOW (ref >60)
Glucose, Bld: 156 mg/dL — ABNORMAL HIGH (ref 70–99)
Potassium: 4.5 mmol/L (ref 3.5–5.1)
Sodium: 131 mmol/L — ABNORMAL LOW (ref 135–145)

## 2024-05-21 LAB — TROPONIN T, HIGH SENSITIVITY
Troponin T High Sensitivity: 28 ng/L — ABNORMAL HIGH (ref 0–19)
Troponin T High Sensitivity: 28 ng/L — ABNORMAL HIGH (ref 0–19)

## 2024-05-21 MED ORDER — IPRATROPIUM-ALBUTEROL 0.5-2.5 (3) MG/3ML IN SOLN
3.0000 mL | Freq: Three times a day (TID) | RESPIRATORY_TRACT | Status: DC
  Administered 2024-05-22: 3 mL via RESPIRATORY_TRACT
  Filled 2024-05-21: qty 3

## 2024-05-21 MED ORDER — PREDNISONE 20 MG PO TABS
40.0000 mg | ORAL_TABLET | Freq: Every day | ORAL | Status: DC
  Administered 2024-05-21 – 2024-05-22 (×2): 40 mg via ORAL
  Filled 2024-05-21 (×2): qty 2

## 2024-05-21 MED ORDER — NETARSUDIL-LATANOPROST 0.02-0.005 % OP SOLN: 1.0000 [drp] | Freq: Every day | OPHTHALMIC | Status: DC

## 2024-05-21 NOTE — ED Notes (Signed)
 Fall bundle in place: bed alarm, non slip socks, and yellow fall risk bracelet.

## 2024-05-21 NOTE — ED Notes (Signed)
 A/O x 4.no attempts out of bed. Cont on 2LNC--dependent on it at home for nights. No complaints.

## 2024-05-21 NOTE — ED Notes (Signed)
 Breathing tx in progress at this time. Pt A&Ox4. Bed alarm on.

## 2024-05-21 NOTE — ED Notes (Signed)
Called dietary for breakfast tray.

## 2024-05-21 NOTE — ED Notes (Signed)
 Called Jori (pt's daughter) to give an update per pt's approval.

## 2024-05-21 NOTE — Progress Notes (Signed)
  PROGRESS NOTE    Brenda Tucker  FMW:[id number] DOB: July 30, 1933 DOA: 05/20/2024 PCP: Kristina Tinnie POUR, PA-C  [id number]  LOS: 0 days   Brief hospital course:   Assessment & Plan: Brenda Tucker is a 89 y.o. female with medical history significant of COPD on 2L of O2 as needed, CAD, recent NSTEMI, HTN, HLD, dCHF, CKD-3b, hypothyroidism, easy to bruise (off ASA), who presents with SOB.   Patient was recently hospitalized from 10/31 - 11/3 due to non-STEMI troponin with trop 61 --> 101.  Patient was treated with IV heparin.  Patient refused cardiac cath.  2D echo showed severe aortic stenosis and grade 1 diastolic CHF.  Patient states that her shortness of breath has been progressively worsening.   COPD exacerbation [facility]):  Patient has SOB, dry cough, although made loud vocal noises with breathing, difficult to tell if true wheezing. --received 125 mg of Solu-Medrol by EMS --cont steroid as prednisone 40 mg daily --DuoNeb scheduled   Chronic diastolic CHF (congestive heart failure)  Severe aortic stenosis 2D echo on 05/03/2024 showed EF of 60 to 65% with grade 1 diastolic dysfunction.  Patient only has trace leg edema, no pulm edema chest x-ray, does not seem to have CHF exacerbation, but proBNP is elevated 1383, patient is at risk of developing CHF exacerbation. - received 20 mg of IV Lasix  x 1 dose --cont home oral lasix  20 mg daily   Recent NSTEMI (non-ST elevated myocardial infarction)  - Patient refused aspirin due to easy bruising --cont Lipitor   Hypothyroidism -Synthroid    Hypertension -IV hydralazine  as needed - Received 10 mg of labetalol in ED - Continue home amlodipine , Coreg , oral hydroxyzine - Patient is also on Lasix    Hyperlipidemia --cont Lipitor   CKD stage 3b, GFR 30-44 ml/min [facility]): Renal function stable.  Recent baseline creatinine 1.55 on 05/16/2024.  Her creatinine is 1.39, BUN 22, GFR 36. -Follow-up with BMP   Lung nodules: These are incidental  findings by CTA. -need to f/u with PCP as outpt to repeat CT-lung in 3-6 months    DVT prophylaxis: Heparin SQ Code Status: DNR  Family Communication:  Level of care: Telemetry Dispo:   The patient is from: home Anticipated d/c is to: home Anticipated d/c date is: 1-2 days   Subjective and Interval History:  Pt reported dyspnea had not improved.   Objective: Vitals:   05/21/24 1325 05/21/24 1400 05/21/24 1431 05/21/24 1522  BP: (!) 137/50 (!) 138/54 (!) 116/56   Pulse: 95 88 85   Resp: 20 20 (!) 21   Temp:   97.6 F (36.4 C)   TempSrc:      SpO2: 99% 100% 95% 97%  Weight:      Height:        Intake/Output Summary (Last 24 hours) at 05/21/2024 1936 Last data filed at 05/21/2024 1903 Gross per 24 hour  Intake 120 ml  Output --  Net 120 ml   Filed Weights   05/20/24 1605  Weight: 45.7 kg    Examination:   Constitutional: NAD, alert, oriented HEENT: conjunctivae and lids normal, EOMI CV: No cyanosis.   RESP: vocal sounds with breathing, on 2L Extremities: mild edema in BLE SKIN: warm, dry Neuro: II - XII grossly intact.     Data Reviewed: I have personally reviewed labs and imaging studies  Time spent: 50 minutes  Ellouise Haber, MD [facility] If 7PM-7AM, please contact night-coverage 05/21/2024, 7:36 PM

## 2024-05-21 NOTE — ED Notes (Signed)
 Pt assisted to bedside commode

## 2024-05-21 NOTE — ED Notes (Signed)
 NURSE MEGAN INFORMED OF BED ASSISTED

## 2024-05-22 DIAGNOSIS — J441 Chronic obstructive pulmonary disease with (acute) exacerbation: Secondary | ICD-10-CM | POA: Diagnosis not present

## 2024-05-22 MED ORDER — IPRATROPIUM-ALBUTEROL 0.5-2.5 (3) MG/3ML IN SOLN: 3.0000 mL | Freq: Four times a day (QID) | RESPIRATORY_TRACT | Status: DC | PRN

## 2024-05-22 NOTE — Progress Notes (Signed)
 Mobility Specialist - Progress Note   05/22/24 0902  Mobility  Activity Ambulated with assistance  Level of Assistance Contact guard assist, steadying assist  Assistive Device None  Distance Ambulated (ft) 12 ft  Activity Response Tolerated well  Mobility visit 1 Mobility  Mobility Specialist Start Time (ACUTE ONLY) 0854  Mobility Specialist Stop Time (ACUTE ONLY) 0900  Mobility Specialist Time Calculation (min) (ACUTE ONLY) 6 min   Pt amb to/from the bathroom CGA-MinG, tolerated well. Pt returned to bed, left supine with alarm set and needs within reach.  America Silvan Mobility Specialist 05/22/24 9:05 AM

## 2024-05-22 NOTE — Progress Notes (Signed)
 Heart Failure Navigator Progress Note  Assessed for Heart & Vascular TOC clinic readiness.  Patient does not meet criteria per Dr. Ellouise Haber note on 05/21/24.  Patient here for COPD exacerbation..  EF 60-65%.  Patient only has a trace of leg edema, no pulmonary edema on chest x-ray and does not seem to have CHF exacerbation, proBNP was elevated 1383, patient at risk of developing CHF exacerbation.  Education Assessment and Provision:  Detailed education and instructions provided on heart failure disease management including the following in the event her symptoms worsen after discharge since she is at risk.    Signs and symptoms of Heart Failure When to call the physician Importance of daily weights Low sodium diet Fluid restriction Medication management Anticipated future follow-up appointments-reviewed all follow-up appointments patient currently has.  She also has an upcoming ECHO scheduled through her [facility] cardiology.  Patient education verbally given on each of the above topics.  Patient acknowledges understanding via teach back method and acceptance of all instructions.  Education material were not given because patient is unable to see them due to visual impairments.    Navigator available for reassessment of patient and will sign off at this time.  Charmaine Pines, RN, BSN [facility] Heart Failure Navigator Secure Chat Only

## 2024-05-22 NOTE — Progress Notes (Signed)
 IV removed. Discharge education completed. Patient wheeled to the medical mall exit to be discharged to the care of family in stable condition.  Brenda Tucker

## 2024-05-22 NOTE — Plan of Care (Signed)
  Problem: Education: Goal: Knowledge of General Education information will improve Description: Including pain rating scale, medication(s)/side effects and non-pharmacologic comfort measures Outcome: Adequate for Discharge   Problem: Health Behavior/Discharge Planning: Goal: Ability to manage health-related needs will improve Outcome: Adequate for Discharge   Problem: Clinical Measurements: Goal: Ability to maintain clinical measurements within normal limits will improve Outcome: Adequate for Discharge Goal: Will remain free from infection Outcome: Adequate for Discharge Goal: Diagnostic test results will improve Outcome: Adequate for Discharge Goal: Respiratory complications will improve Outcome: Adequate for Discharge Goal: Cardiovascular complication will be avoided Outcome: Adequate for Discharge   Problem: Activity: Goal: Risk for activity intolerance will decrease Outcome: Adequate for Discharge   Problem: Nutrition: Goal: Adequate nutrition will be maintained Outcome: Adequate for Discharge   Problem: Coping: Goal: Level of anxiety will decrease Outcome: Adequate for Discharge   Problem: Elimination: Goal: Will not experience complications related to bowel motility Outcome: Adequate for Discharge Goal: Will not experience complications related to urinary retention Outcome: Adequate for Discharge   Problem: Pain Managment: Goal: General experience of comfort will improve and/or be controlled Outcome: Adequate for Discharge   Problem: Safety: Goal: Ability to remain free from injury will improve Outcome: Adequate for Discharge   Problem: Skin Integrity: Goal: Risk for impaired skin integrity will decrease Outcome: Adequate for Discharge   Problem: Education: Goal: Ability to demonstrate management of disease process will improve Outcome: Adequate for Discharge Goal: Ability to verbalize understanding of medication therapies will improve Outcome: Adequate  for Discharge Goal: Individualized Educational Video(s) Outcome: Adequate for Discharge   Problem: Activity: Goal: Capacity to carry out activities will improve Outcome: Adequate for Discharge   Problem: Cardiac: Goal: Ability to achieve and maintain adequate cardiopulmonary perfusion will improve Outcome: Adequate for Discharge   Problem: Education: Goal: Knowledge of disease or condition will improve Outcome: Adequate for Discharge Goal: Knowledge of the prescribed therapeutic regimen will improve Outcome: Adequate for Discharge Goal: Individualized Educational Video(s) Outcome: Adequate for Discharge   Problem: Activity: Goal: Ability to tolerate increased activity will improve Outcome: Adequate for Discharge Goal: Will verbalize the importance of balancing activity with adequate rest periods Outcome: Adequate for Discharge   Problem: Respiratory: Goal: Ability to maintain a clear airway will improve Outcome: Adequate for Discharge Goal: Levels of oxygenation will improve Outcome: Adequate for Discharge Goal: Ability to maintain adequate ventilation will improve Outcome: Adequate for Discharge

## 2024-05-22 NOTE — TOC Initial Note (Signed)
 Transition of Care [facility]) - Initial/Assessment Note    Patient Details  Name: Brenda Tucker MRN: [id number] Date of Birth: [birth year]  Transition of Care [facility]) CM/SW Contact:    Brenda ONEIDA Haddock, RN Phone Number: 05/22/2024, 3:24 PM  Clinical Narrative:                 Noted patient to dc today Patient states that she lives at home alone.  States that her son will be transporting at discharge Patient has been assessed by heart failure navigator.  Discussed option for [facility] RN and PT.  Patient declines Patient confirms that she has O2 concentrator and portable o2 at home         Patient Goals and CMS Choice            Expected Discharge Plan and Services         Expected Discharge Date: 05/22/24                                    Prior Living Arrangements/Services                       Activities of Daily Living   ADL Screening (condition at time of admission) Independently performs ADLs?: Yes (appropriate for developmental age) Is the patient deaf or have difficulty hearing?: No Does the patient have difficulty seeing, even when wearing glasses/contacts?: Yes Does the patient have difficulty concentrating, remembering, or making decisions?: No  Permission Sought/Granted                  Emotional Assessment              Admission diagnosis:  Unstable angina (HCC) [I20.0] COPD exacerbation (HCC) [J44.1] Hypertensive urgency [I16.0] Patient Active Problem List   Diagnosis Date Noted   COPD exacerbation (HCC) 05/20/2024   Severe aortic stenosis 05/20/2024   Chronic diastolic CHF (congestive heart failure) (HCC) 05/20/2024   Lung nodules 05/20/2024   CAD (coronary artery disease) 05/20/2024   NSTEMI (non-ST elevated myocardial infarction) (HCC) 05/02/2024   Generalized abdominal pain 12/30/2019   CKD stage 3b, GFR 30-44 ml/min (HCC) 06/30/2019   Urinary tract infection with hematuria 12/20/2018   Low back pain 12/20/2018    Bilateral carotid artery stenosis 06/19/2018   Cellulitis of finger of right hand 03/13/2018   Encounter for screening mammogram for malignant neoplasm of breast 12/18/2017   Vitamin D  deficiency 12/18/2017   Dysuria 12/18/2017   Chronic obstructive pulmonary disease (HCC) 08/17/2017   SOB (shortness of breath) 08/17/2017   GERD (gastroesophageal reflux disease) 08/17/2017   Hyperlipidemia 08/17/2017   Hypertension 08/17/2017   Hypothyroidism 08/17/2017   Osteoarthritis 08/17/2017   PCP:  Brenda Tinnie POUR, PA-C Pharmacy:   [facility] [address] [place] - [address] [place] [address] Phone: [phone] Fax: [phone]     Social Drivers of Health (SDOH) Social History: SDOH Screenings   Food Insecurity: No Food Insecurity (05/21/2024)  Housing: Low Risk  (05/21/2024)  Transportation Needs: No Transportation Needs (05/21/2024)  Utilities: Not At Risk (05/21/2024)  Alcohol Screen: Low Risk  (08/19/2020)  Depression (PHQ2-9): Low Risk  (01/14/2024)  Financial Resource Strain: Low Risk  (12/15/2020)  Social Connections: Unknown (05/22/2024)  Tobacco Use: Low Risk  (05/20/2024)   SDOH Interventions:     Readmission Risk Interventions     No data to  display

## 2024-05-22 NOTE — Progress Notes (Signed)
 Admitted to 2C-230. A&O x 4- HOH. Oriented to the unit including fall safety and call-light use to which she stated understanding.

## 2024-05-26 ENCOUNTER — Ambulatory Visit: Admitting: Internal Medicine

## 2024-05-26 ENCOUNTER — Telehealth: Payer: Self-pay

## 2024-05-26 ENCOUNTER — Encounter: Payer: Self-pay | Admitting: Internal Medicine

## 2024-05-26 VITALS — BP 152/60 | HR 62 | Temp 98.0°F | Resp 16 | Ht <= 58 in | Wt 108.0 lb

## 2024-05-26 DIAGNOSIS — I252 Old myocardial infarction: Secondary | ICD-10-CM

## 2024-05-26 DIAGNOSIS — Z09 Encounter for follow-up examination after completed treatment for conditions other than malignant neoplasm: Secondary | ICD-10-CM

## 2024-05-26 DIAGNOSIS — N184 Chronic kidney disease, stage 4 (severe): Secondary | ICD-10-CM | POA: Diagnosis not present

## 2024-05-26 DIAGNOSIS — I350 Nonrheumatic aortic (valve) stenosis: Secondary | ICD-10-CM

## 2024-05-26 DIAGNOSIS — I5032 Chronic diastolic (congestive) heart failure: Secondary | ICD-10-CM | POA: Diagnosis not present

## 2024-05-26 DIAGNOSIS — J9611 Chronic respiratory failure with hypoxia: Secondary | ICD-10-CM | POA: Diagnosis not present

## 2024-05-26 NOTE — Progress Notes (Signed)
 [facility] [address] [place], [place] [address]  Internal MEDICINE  Office Visit Note  Patient Name: Brenda Tucker  [id number]  [id number]  Date of Service: 05/26/2024     Chief Complaint  Patient presents with   Hospitalization Follow-up    COPD     HPI Pt is here for recent hospital follow up.  She presented to ED with worsening sSOB, dry cough, although made loud vocal noises with breathing, difficult to tell if true wheezing. --received 125 mg of Solu-Medrol  by EMS --steroid as prednisone  40 mg daily --DuoNeb scheduled  Patient was recently hospitalized from 10/31 - 11/3 due to non-STEMI troponin with trop 61 --> 101. Patient was treated with IV heparin . Patient refused cardiac cath. 2D echo showed severe aortic stenosis and grade 1 diastolic CHF. Patient states that her shortness of breath has been progressively worsening  Daughter in [place] in the room, she is unable to see, needs help at home  Risk of CHF present  Current Medication: Outpatient Encounter Medications as of 05/26/2024  Medication Sig Note   acetaminophen  (TYLENOL ) 500 MG tablet Take 500 mg by mouth every 6 (six) hours as needed for moderate pain or headache. 05/02/2024: prn   albuterol  (VENTOLIN  HFA) 108 (90 Base) MCG/ACT inhaler Inhale 2 puffs into the lungs every 6 (six) hours as needed for wheezing or shortness of breath.    amLODipine  (NORVASC ) 2.5 MG tablet Take 1 tablet (2.5 mg total) by mouth daily.    atorvastatin  (LIPITOR) 10 MG tablet TAKE 1 TABLET BY MOUTH EVERY OTHER DAY    budesonide -formoterol  (SYMBICORT ) 160-4.5 MCG/ACT inhaler Inhale 2 puffs into the lungs 2 (two) times daily.    calcium  carbonate (OSCAL) 1500 (600 Ca) MG TABS tablet Take 600 mg of elemental calcium  by mouth daily.    carvedilol  (COREG ) 25 MG tablet TAKE 1/2 TABLET (12.5 MG) BY MOUTH TWICE DAILY WITH A MEAL    Cholecalciferol  (VITAMIN D ) 50 MCG (2000 UT) tablet Take 2,000 Units by mouth daily.    furosemide   (LASIX ) 20 MG tablet TAKE 1 TABLET (20MG ) BY MOUTH DAILY    hydrALAZINE  (APRESOLINE ) 10 MG tablet Take 1 tablet (10 mg total) by mouth 2 (two) times daily.    levothyroxine  (SYNTHROID ) 50 MCG tablet TAKE 1 TABLET (50 MCG) BY MOUTH DAILY BEFORE BREAKFAST    OXYGEN  Inhale into the lungs. 2 litre AHP    ROCKLATAN  0.02-0.005 % SOLN Place 1 drop into both eyes at bedtime.    vitamin B-12 (CYANOCOBALAMIN ) 1000 MCG tablet Take 1,000 mcg by mouth daily.    [DISCONTINUED] fluticasone -salmeterol (ADVAIR DISKUS) 250-50 MCG/ACT AEPB INHALE 1 PUFF INTO THE LUNGS TWICE DAILY    No facility-administered encounter medications on file as of 05/26/2024.    Surgical History: Past Surgical History:  Procedure Laterality Date   ABDOMINAL HYSTERECTOMY     APPENDECTOMY     CHOLECYSTECTOMY N/A 03/12/2020   Procedure: LAPAROSCOPIC CHOLECYSTECTOMY;  Surgeon: Marolyn Nest, MD;  Location: [facility] ORS;  Service: General;  Laterality: N/A;   COLONOSCOPY WITH PROPOFOL  N/A 07/09/2015   Procedure: COLONOSCOPY WITH PROPOFOL ;  Surgeon: Lamar ONEIDA Holmes, MD;  Location[facility] ENDOSCOPY;  Service: Endoscopy;  Laterality: N/A;    Medical History: Past Medical History:  Diagnosis Date   Arthritis    Asthma    Cancer (HCC)    skin, colon polyp   Chronic kidney disease    Complication of anesthesia    asthma attack per pt   COPD (chronic  obstructive pulmonary disease) (HCC)    Dyspnea    Hypertension    Hypothyroidism     Family History: Family History  Problem Relation Age of Onset   Heart attack Father    Early death Sister        MVA   Stomach cancer Brother    Cancer Brother        shoulder   Lung cancer Brother    Cancer Sister    Cancer Sister    Early death Sister        husband killed her   Breast cancer Neg Hx     Social History   Socioeconomic History   Marital status: Widowed    Spouse name: Not on file   Number of children: Not on file   Years of education: Not on file   Highest  education level: Not on file  Occupational History   Not on file  Tobacco Use   Smoking status: Never   Smokeless tobacco: Never   Tobacco comments:    second hand smoke  Vaping Use   Vaping status: Never Used  Substance and Sexual Activity   Alcohol use: No   Drug use: No   Sexual activity: Not Currently  Other Topics Concern   Not on file  Social History Narrative   Not on file   Social Drivers of Health   Financial Resource Strain: Low Risk  (12/15/2020)   Overall Financial Resource Strain (CARDIA)    Difficulty of Paying Living Expenses: Not very hard  Food Insecurity: No Food Insecurity (05/21/2024)   Hunger Vital Sign    Worried About Running Out of Food in the Last Year: Never true    Ran Out of Food in the Last Year: Never true  Transportation Needs: No Transportation Needs (05/21/2024)   PRAPARE - Administrator, Civil Service (Medical): No    Lack of Transportation (Non-Medical): No  Physical Activity: Not on file  Stress: Not on file  Social Connections: Unknown (05/22/2024)   Social Connection and Isolation Panel    Frequency of Communication with Friends and Family: Three times a week    Frequency of Social Gatherings with Friends and Family: Twice a week    Attends Religious Services: Patient declined    Database Administrator or Organizations: Patient declined    Attends Banker Meetings: Patient declined    Marital Status: Patient declined  Intimate Partner Violence: Not At Risk (05/21/2024)   Humiliation, Afraid, Rape, and Kick questionnaire    Fear of Current or Ex-Partner: No    Emotionally Abused: No    Physically Abused: No    Sexually Abused: No      Review of Systems  Constitutional:  Negative for fatigue and fever.  HENT:  Negative for congestion, mouth sores and postnasal drip.   Respiratory:  Positive for shortness of breath. Negative for cough.   Cardiovascular:  Positive for leg swelling.  Genitourinary:   Negative for flank pain.  Psychiatric/Behavioral: Negative.      Vital Signs: BP (!) 152/60 Comment: 152/60  Pulse 62   Temp 98 F (36.7 C)   Resp 16   Ht 4' 8 (1.422 m)   Wt 108 lb (49 kg)   SpO2 98%   BMI 24.21 kg/m    Physical Exam Constitutional:      Appearance: Normal appearance.  HENT:     Head: Normocephalic and atraumatic.     Nose: Nose normal.  Mouth/Throat:     Mouth: Mucous membranes are moist.     Pharynx: No posterior oropharyngeal erythema.  Eyes:     Extraocular Movements: Extraocular movements intact.     Pupils: Pupils are equal, round, and reactive to light.  Cardiovascular:     Pulses: Normal pulses.     Heart sounds: Normal heart sounds.  Pulmonary:     Effort: Pulmonary effort is normal.     Breath sounds: Normal breath sounds.  Neurological:     General: No focal deficit present.     Mental Status: She is alert.  Psychiatric:        Mood and Affect: Mood normal.        Behavior: Behavior normal.       Assessment/Plan: 1. Severe aortic valve stenosis (Primary) Pt does not wish to proceed with any surgical intervention, will monitor - Ambulatory referral to Home Health - Amb Referral to Palliative Care  2. Chronic respiratory failure with hypoxia [facility]) Will consult palliative care  - Ambulatory referral to Home Health - Amb Referral to Palliative Care  3. H/O non-ST elevation myocardial infarction (NSTEMI) Risk of repeated event, will  - Ambulatory referral to Home Health - Amb Referral to Palliative Care  4. Chronic diastolic heart failure (HCC) Continue Lasix  20 mg po every day, avoid excessive diuresis due to severe AS   General Counseling: Alanna verbalizes understanding of the findings of todays visit and agrees with plan of treatment. I have discussed any further diagnostic evaluation that [place] be needed or ordered today. We also reviewed her medications today. she has been encouraged to call the office with any questions  or concerns that should arise related to todays visit.    Counseling:  [place] Controlled Substance Database was reviewed by me.  Orders Placed This Encounter  Procedures   Ambulatory referral to Home Health   Amb Referral to Palliative Care      I have reviewed all medical records from hospital follow up including radiology reports and consults from other physicians. Appropriate follow up diagnostics will be scheduled as needed. Patient/ Family understands the plan of treatment. Time spent45 minutes.   Dr Sigrid CHRISTELLA Bathe, MD Internal Medicine

## 2024-05-26 NOTE — Telephone Encounter (Signed)
 Called [facility] they will take her referral

## 2024-05-30 NOTE — Discharge Summary (Addendum)
 Physician Discharge Summary   Brenda Tucker  female DOB: 31-Jan-1934  FMW:[id number]  PCP: Kristina Tinnie POUR, PA-C  Admit date: 05/20/2024 Discharge date: 05/22/2024  Admitted From: home Disposition:  home Home Health: Pt declined CODE STATUS: DNR   Hospital Course:  For full details, please see H&P, progress notes, consult notes and ancillary notes.  Briefly,  Brenda Tucker is a 89 y.o. female with medical history significant of COPD on 2L of O2 as needed, CAD, recent NSTEMI, HTN, dCHF, CKD-3b, hypothyroidism, who presented with SOB.   Patient was recently hospitalized from 10/31 - 11/3 due to non-STEMI troponin with trop 61 --> 101.  Patient was treated with IV heparin .  Patient refused cardiac cath.  2D echo showed severe aortic stenosis and grade 1 diastolic CHF.  Patient states that her shortness of breath has been progressively worsening.   COPD exacerbation [facility]):  Patient has SOB, dry cough, although made loud vocal noises with breathing, difficult to tell if true wheezing. --received 125 mg of Solu-Medrol  by EMS and then prednisone .  Received scheduled DuoNeb. --cont bronchodilators --pt reported breathing much improved and ready to go home on the day of discharge.  Discharged to finish prednisone  50 mg daily x 5 days at home (already ordered by ED provider).   Chronic diastolic CHF (congestive heart failure)  Severe aortic stenosis 2D echo on 05/03/2024 showed EF of 60 to 65% with grade 1 diastolic dysfunction.  Patient only has trace leg edema, no pulm edema chest x-ray, does not seem to have CHF exacerbation. - received 20 mg of IV Lasix  x 1 dose --cont home oral lasix  20 mg daily   Recent NSTEMI (non-ST elevated myocardial infarction)  - Patient refused aspirin due to easy bruising --cont Lipitor   Hypothyroidism -Synthroid    Hypertension --not taking Lopressor  regularly PTA --cont home coreg , amlodipine , lasix , hydralazine    Hyperlipidemia --cont Lipitor    CKD stage 3b, GFR 30-44 ml/min [facility]):  Renal function stable.    Lung nodules:  These are incidental findings by CTA. -need to f/u with PCP as outpt to repeat CT-lung in 3-6 months   Discharge Diagnoses:  Principal Problem:   COPD exacerbation (HCC) Active Problems:   Chronic diastolic CHF (congestive heart failure) (HCC)   Severe aortic stenosis   NSTEMI (non-ST elevated myocardial infarction) (HCC)   CAD (coronary artery disease)   Hypothyroidism   Hypertension   Hyperlipidemia   CKD stage 3b, GFR 30-44 ml/min (HCC)   Lung nodules   30 Day Unplanned Readmission Risk Score    Flowsheet Row ED to Hosp-Admission (Discharged) from 05/20/2024 in [facility]  30 Day Unplanned Readmission Risk Score (%) 16.74 Filed at 05/22/2024 1600    This score is the patient's risk of an unplanned readmission within 30 days of being discharged (0 -100%). The score is based on dignosis, age, lab data, medications, orders, and past utilization.   Low:  0-14.9   Medium: 15-21.9   High: 22-29.9   Extreme: 30 and above         Discharge Instructions:  Allergies as of 05/22/2024       Reactions   Theodrenaline    Shaky, nervous        Medication List     STOP taking these medications    metoprolol  tartrate 100 MG tablet Commonly known as: LOPRESSOR        TAKE these medications    acetaminophen  500 MG tablet Commonly known  as: TYLENOL  Take 500 mg by mouth every 6 (six) hours as needed for moderate pain or headache.   albuterol  108 (90 Base) MCG/ACT inhaler Commonly known as: VENTOLIN  HFA Inhale 2 puffs into the lungs every 6 (six) hours as needed for wheezing or shortness of breath.   amLODipine  2.5 MG tablet Commonly known as: NORVASC  Take 1 tablet (2.5 mg total) by mouth daily.   atorvastatin  10 MG tablet Commonly known as: LIPITOR TAKE 1 TABLET BY MOUTH EVERY OTHER DAY   budesonide -formoterol  160-4.5 MCG/ACT  inhaler Commonly known as: SYMBICORT  Inhale 2 puffs into the lungs 2 (two) times daily.   calcium  carbonate 1500 (600 Ca) MG Tabs tablet Commonly known as: OSCAL Take 600 mg of elemental calcium  by mouth daily.   carvedilol  25 MG tablet Commonly known as: COREG  TAKE 1/2 TABLET (12.5 MG) BY MOUTH TWICE DAILY WITH A MEAL   cyanocobalamin  1000 MCG tablet Commonly known as: VITAMIN B12 Take 1,000 mcg by mouth daily.   furosemide  20 MG tablet Commonly known as: LASIX  TAKE 1 TABLET (20MG ) BY MOUTH DAILY   hydrALAZINE  10 MG tablet Commonly known as: APRESOLINE  Take 1 tablet (10 mg total) by mouth 2 (two) times daily.   levothyroxine  50 MCG tablet Commonly known as: SYNTHROID  TAKE 1 TABLET (50 MCG) BY MOUTH DAILY BEFORE BREAKFAST   OXYGEN  Inhale into the lungs. 2 litre AHP   Rocklatan  0.02-0.005 % Soln Generic drug: Netarsudil -Latanoprost  Place 1 drop into both eyes at bedtime.   Vitamin D  50 MCG (2000 UT) tablet Take 2,000 Units by mouth daily.       ASK your doctor about these medications    predniSONE  50 MG tablet Commonly known as: DELTASONE  Take 1 tablet (50 mg total) by mouth daily with breakfast for 5 days. Ask about: Should I take this medication?         Follow-up Information     McDonough, Tinnie POUR, PA-C Follow up in 1 week(s).   Specialty: Physician Assistant Contact information: [address] [place] [address] [phone]                 Allergies  Allergen Reactions   Theodrenaline     Shaky, nervous     The results of significant diagnostics from this hospitalization (including imaging, microbiology, ancillary and laboratory) are listed below for reference.   Consultations:   Procedures/Studies: CT Angio Chest Aorta W and/or Wo Contrast Result Date: 05/20/2024 CLINICAL DATA:  Hypertension and shortness of breath. EXAM: CT ANGIOGRAPHY CHEST WITH CONTRAST TECHNIQUE: Multidetector CT imaging of the chest was performed using  the standard protocol during bolus administration of intravenous contrast. Multiplanar CT image reconstructions and MIPs were obtained to evaluate the vascular anatomy. RADIATION DOSE REDUCTION: This exam was performed according to the departmental dose-optimization program which includes automated exposure control, adjustment of the mA and/or kV according to patient size and/or use of iterative reconstruction technique. CONTRAST:  75mL OMNIPAQUE  IOHEXOL  350 MG/ML SOLN COMPARISON:  None Available. FINDINGS: Cardiovascular: There is marked severity calcification of the thoracic aorta, without evidence of aortic aneurysm or dissection. Satisfactory opacification of the pulmonary arteries to the segmental level. No evidence of pulmonary embolism. Normal heart size with marked severity coronary artery calcification. No pericardial effusion. Mediastinum/Nodes: A 1.9 cm lymph node is seen within the region anterior and inferior to the left mainstem bronchus. Thyroid  gland, trachea, and esophagus demonstrate no significant findings. Lungs/Pleura: There is mild posteromedial right middle lobe scarring and/or atelectasis. An adjacent 9  mm pulmonary nodule is noted (axial CT image 104, CT series 7). 3 mm anterior right upper lobe pulmonary nodules are seen (axial CT image 37 and 39, CT series 7). No pleural effusion or pneumothorax is identified. Upper Abdomen: The right kidney is atrophic in appearance. A diminutive mid to distal right renal artery is also seen. Musculoskeletal: Multilevel degenerative changes are present throughout the thoracic spine. Review of the MIP images confirms the above findings. IMPRESSION: 1. No evidence of pulmonary embolism. 2. Mild posteromedial right middle lobe scarring and/or atelectasis. 3. Multiple right-sided pulmonary nodules, as described above. Non-contrast chest CT at 3-6 months is recommended. If the nodules are stable at time of repeat CT, then future CT at 18-24 months (from  today's scan) is considered optional for low-risk patients, but is recommended for high-risk patients. This recommendation follows the consensus statement: Guidelines for Management of Incidental Pulmonary Nodules Detected on CT Images: From the Fleischner Society 2017; Radiology 2017; 284:228-243. 4. Marked severity coronary artery calcification. 5. Atrophic right kidney. 6. Aortic atherosclerosis. Electronically Signed   By: Suzen Dials M.D.   On: 05/20/2024 19:14   DG Chest Port 1 View Result Date: 05/20/2024 CLINICAL DATA:  Respiratory distress EXAM: PORTABLE CHEST 1 VIEW COMPARISON:  Chest x-ray 05/02/2024 FINDINGS: The heart size and mediastinal contours are within normal limits. Both lungs are clear. The visualized skeletal structures are unremarkable. IMPRESSION: No active disease. Electronically Signed   By: Greig Pique M.D.   On: 05/20/2024 16:44   ECHOCARDIOGRAM COMPLETE Result Date: 05/03/2024    ECHOCARDIOGRAM REPORT   Patient Name:   Brenda Tucker Date of Exam: 05/03/2024 Medical Rec #:  [id number]  Height:       56.0 in Accession #:    [id number] Weight:       115.0 lb Date of Birth:  27-Mar-1934  BSA:          1.403 m Patient Age:    89 years   BP:           162/49 mmHg Patient Gender: F          HR:           65 bpm. Exam Location:  [facility] Procedure: 2D Echo, Cardiac Doppler, Color Doppler and Strain Analysis (Both            Spectral and Color Flow Doppler were utilized during procedure). Indications:     Chest Pain R07.9  History:         Patient has prior history of Echocardiogram examinations, most                  recent 06/20/2022.  Sonographer:     Thedora Louder RDCS, FASE Referring Phys:  [id number] AMY N COX Diagnosing Phys: Shelda Bruckner MD  Sonographer Comments: Global longitudinal strain was attempted. IMPRESSIONS  1. Left ventricular ejection fraction, by estimation, is 60 to 65%. The left ventricle has normal function. The left ventricle has no regional wall motion  abnormalities. There is mild concentric left ventricular hypertrophy. Left ventricular diastolic parameters are consistent with Grade I diastolic dysfunction (impaired relaxation). The average left ventricular global longitudinal strain is -16.7 %. The global longitudinal strain is normal.  2. Right ventricular systolic function is normal. The right ventricular size is normal. There is normal pulmonary artery systolic pressure.  3. Left atrial size was mildly dilated.  4. The mitral valve is normal in structure. Trivial mitral valve regurgitation. No evidence of mitral stenosis. Moderate  mitral annular calcification.  5. Aortic valve stenosis is severe by AVA and DI, moderate by velocity/gradient but with low stroke volume, suspect this is low flow low gradient. The aortic valve is calcified. There is moderate calcification of the aortic valve. There is moderate thickening of the aortic valve. Aortic valve regurgitation is mild. moderate to severe. Aortic valve area, by VTI measures 0.60 cm. Aortic valve mean gradient measures 28.6 mmHg. Aortic valve Vmax measures 3.59 m/s.  6. The inferior vena cava is normal in size with greater than 50% respiratory variability, suggesting right atrial pressure of 3 mmHg. Comparison(s): Changes from prior study are noted. Aortic stenosis has progressed to severe compared to study from 2023. Conclusion(s)/Recommendation(s): Low flow low gradient severe aortic stenosis as noted. FINDINGS  Left Ventricle: Left ventricular ejection fraction, by estimation, is 60 to 65%. The left ventricle has normal function. The left ventricle has no regional wall motion abnormalities. The average left ventricular global longitudinal strain is -16.7 %. Strain was performed and the global longitudinal strain is normal. The left ventricular internal cavity size was normal in size. There is mild concentric left ventricular hypertrophy. Left ventricular diastolic parameters are consistent with Grade I  diastolic dysfunction (impaired relaxation). Right Ventricle: The right ventricular size is normal. No increase in right ventricular wall thickness. Right ventricular systolic function is normal. There is normal pulmonary artery systolic pressure. The tricuspid regurgitant velocity is 1.97 m/s, and  with an assumed right atrial pressure of 3 mmHg, the estimated right ventricular systolic pressure is 18.5 mmHg. Left Atrium: Left atrial size was mildly dilated. Right Atrium: Right atrial size was normal in size. Pericardium: Trivial pericardial effusion is present. Mitral Valve: The mitral valve is normal in structure. Moderate mitral annular calcification. Trivial mitral valve regurgitation. No evidence of mitral valve stenosis. Tricuspid Valve: The tricuspid valve is normal in structure. Tricuspid valve regurgitation is trivial. No evidence of tricuspid stenosis. Aortic Valve: Aortic valve stenosis is severe by AVA and DI, moderate by velocity/gradient but with low stroke volume, suspect this is low flow low gradient. The aortic valve is calcified. There is moderate calcification of the aortic valve. There is moderate thickening of the aortic valve. Aortic valve regurgitation is mild. Aortic regurgitation PHT measures 534 msec. Moderate to severe. Aortic valve mean gradient measures 28.6 mmHg. Aortic valve peak gradient measures 51.7 mmHg. Aortic valve area, by VTI measures 0.60 cm. Pulmonic Valve: The pulmonic valve was not well visualized. Pulmonic valve regurgitation is not visualized. No evidence of pulmonic stenosis. Aorta: The aortic root and ascending aorta are structurally normal, with no evidence of dilitation. Venous: The inferior vena cava is normal in size with greater than 50% respiratory variability, suggesting right atrial pressure of 3 mmHg. IAS/Shunts: The atrial septum is grossly normal.  LEFT VENTRICLE PLAX 2D LVIDd:         3.80 cm   Diastology LVIDs:         2.50 cm   LV e' medial:    5.44 cm/s  LV PW:         1.50 cm   LV E/e' medial:  13.2 LV IVS:        1.30 cm   LV e' lateral:   4.90 cm/s LVOT diam:     1.80 cm   LV E/e' lateral: 14.6 LV SV:         47 LV SV Index:   34        2D Longitudinal Strain LVOT Area:  2.54 cm  2D Strain GLS (A4C):   -13.7 % LV IVRT:       119 msec  2D Strain GLS (A3C):   -16.3 %                          2D Strain GLS (A2C):   -20.2 %                          2D Strain GLS Avg:     -16.7 % RIGHT VENTRICLE RV Basal diam:  2.50 cm RV S prime:     10.00 cm/s TAPSE (M-mode): 2.3 cm LEFT ATRIUM           Index        RIGHT ATRIUM          Index LA diam:      3.90 cm 2.78 cm/m   RA Area:     7.44 cm LA Vol (A2C): 27.4 ml 19.53 ml/m  RA Volume:   12.20 ml 8.69 ml/m LA Vol (A4C): 40.1 ml 28.58 ml/m  AORTIC VALVE                     PULMONIC VALVE AV Area (Vmax):    0.61 cm      PV Vmax:        1.10 m/s AV Area (Vmean):   0.60 cm      PV Peak grad:   4.8 mmHg AV Area (VTI):     0.60 cm      RVOT Peak grad: 4 mmHg AV Vmax:           359.40 cm/s AV Vmean:          250.400 cm/s AV VTI:            0.786 m AV Peak Grad:      51.7 mmHg AV Mean Grad:      28.6 mmHg LVOT Vmax:         86.70 cm/s LVOT Vmean:        59.300 cm/s LVOT VTI:          0.186 m LVOT/AV VTI ratio: 0.24 AI PHT:            534 msec  AORTA Ao Root diam: 2.80 cm Ao Asc diam:  2.90 cm MITRAL VALVE               TRICUSPID VALVE MV Area (PHT): 2.43 cm    TR Peak grad:   15.5 mmHg MV Decel Time: 312 msec    TR Vmax:        197.00 cm/s MV E velocity: 71.60 cm/s MV A velocity: 94.30 cm/s  SHUNTS MV E/A ratio:  0.76        Systemic VTI:  0.19 m                            Systemic Diam: 1.80 cm Shelda Bruckner MD Electronically signed by Shelda Bruckner MD Signature Date/Time: 05/03/2024/1:13:11 PM    Final    DG Chest 2 View Result Date: 05/02/2024 EXAM: 2 VIEW(S) XRAY OF THE CHEST 05/02/2024 11:06:00 AM COMPARISON: 06/14/2023 CLINICAL HISTORY: Chest pain. FINDINGS: LUNGS AND PLEURA: No focal pulmonary  opacity. No pulmonary edema. No pleural effusion. No pneumothorax. HEART AND MEDIASTINUM: Aortic atherosclerosis. BONES AND SOFT TISSUES: Levoconvex lower thoracic scoliosis and degenerative changes.  IMPRESSION: 1. No acute cardiopulmonary findings. 2. Aortic atherosclerosis. 3. Levoconvex lower thoracic scoliosis and thoracolumbar degenerative changes. Electronically signed by: Ryan Salvage MD 05/02/2024 11:55 AM EDT RP Workstation: [id number]      Labs: BNP (last 3 results) No results for input(s): BNP in the last 8760 hours. Basic Metabolic Panel: No results for input(s): NA, K, CL, CO2, GLUCOSE, BUN, CREATININE, CALCIUM , MG, PHOS in the last 168 hours. Liver Function Tests: No results for input(s): AST, ALT, ALKPHOS, BILITOT, PROT, ALBUMIN in the last 168 hours. No results for input(s): LIPASE, AMYLASE in the last 168 hours. No results for input(s): AMMONIA in the last 168 hours. CBC: No results for input(s): WBC, NEUTROABS, HGB, HCT, MCV, PLT in the last 168 hours. Cardiac Enzymes: No results for input(s): CKTOTAL, CKMB, CKMBINDEX, TROPONINI in the last 168 hours. BNP: Invalid input(s): POCBNP CBG: No results for input(s): GLUCAP in the last 168 hours. D-Dimer No results for input(s): DDIMER in the last 72 hours. Hgb A1c No results for input(s): HGBA1C in the last 72 hours. Lipid Profile No results for input(s): CHOL, HDL, LDLCALC, TRIG, CHOLHDL, LDLDIRECT in the last 72 hours. Thyroid  function studies No results for input(s): TSH, T4TOTAL, T3FREE, THYROIDAB in the last 72 hours.  Invalid input(s): FREET3 Anemia work up No results for input(s): VITAMINB12, FOLATE, FERRITIN, TIBC, IRON, RETICCTPCT in the last 72 hours. Urinalysis    Component Value Date/Time   COLORURINE YELLOW (A) 05/02/2024 1722   APPEARANCEUR CLOUDY (A) 05/02/2024 1722   APPEARANCEUR Clear  01/11/2023 1549   LABSPEC 1.005 05/02/2024 1722   PHURINE 8.0 05/02/2024 1722   GLUCOSEU NEGATIVE 05/02/2024 1722   HGBUR NEGATIVE 05/02/2024 1722   BILIRUBINUR NEGATIVE 05/02/2024 1722   BILIRUBINUR Negative 01/11/2023 1549   KETONESUR NEGATIVE 05/02/2024 1722   PROTEINUR 30 (A) 05/02/2024 1722   UROBILINOGEN 0.2 12/19/2018 1056   NITRITE NEGATIVE 05/02/2024 1722   LEUKOCYTESUR LARGE (A) 05/02/2024 1722   Sepsis Labs No results for input(s): WBC in the last 168 hours.  Invalid input(s): PROCALCITONIN, LACTICIDVEN Microbiology No results found for this or any previous visit (from the past 240 hours).   Total time spend on discharging this patient, including the last patient exam, discussing the hospital stay, instructions for ongoing care as it relates to all pertinent caregivers, as well as preparing the medical discharge records, prescriptions, and/or referrals as applicable, is 40 minutes.    Ellouise Haber, MD  [facility] 05/30/2024, 1:49 PM

## 2024-06-02 ENCOUNTER — Telehealth: Payer: Self-pay

## 2024-06-02 DIAGNOSIS — Z9981 Dependence on supplemental oxygen: Secondary | ICD-10-CM | POA: Diagnosis not present

## 2024-06-02 DIAGNOSIS — M1990 Unspecified osteoarthritis, unspecified site: Secondary | ICD-10-CM | POA: Diagnosis not present

## 2024-06-02 DIAGNOSIS — Z7951 Long term (current) use of inhaled steroids: Secondary | ICD-10-CM | POA: Diagnosis not present

## 2024-06-02 DIAGNOSIS — J441 Chronic obstructive pulmonary disease with (acute) exacerbation: Secondary | ICD-10-CM | POA: Diagnosis not present

## 2024-06-02 DIAGNOSIS — N189 Chronic kidney disease, unspecified: Secondary | ICD-10-CM | POA: Diagnosis not present

## 2024-06-02 DIAGNOSIS — I130 Hypertensive heart and chronic kidney disease with heart failure and stage 1 through stage 4 chronic kidney disease, or unspecified chronic kidney disease: Secondary | ICD-10-CM | POA: Diagnosis not present

## 2024-06-02 DIAGNOSIS — I350 Nonrheumatic aortic (valve) stenosis: Secondary | ICD-10-CM | POA: Diagnosis not present

## 2024-06-02 DIAGNOSIS — E039 Hypothyroidism, unspecified: Secondary | ICD-10-CM | POA: Diagnosis not present

## 2024-06-02 DIAGNOSIS — I214 Non-ST elevation (NSTEMI) myocardial infarction: Secondary | ICD-10-CM | POA: Diagnosis not present

## 2024-06-02 DIAGNOSIS — Z85828 Personal history of other malignant neoplasm of skin: Secondary | ICD-10-CM | POA: Diagnosis not present

## 2024-06-02 DIAGNOSIS — J9611 Chronic respiratory failure with hypoxia: Secondary | ICD-10-CM | POA: Diagnosis not present

## 2024-06-02 DIAGNOSIS — I252 Old myocardial infarction: Secondary | ICD-10-CM | POA: Diagnosis not present

## 2024-06-02 DIAGNOSIS — I5032 Chronic diastolic (congestive) heart failure: Secondary | ICD-10-CM | POA: Diagnosis not present

## 2024-06-02 DIAGNOSIS — J4489 Other specified chronic obstructive pulmonary disease: Secondary | ICD-10-CM | POA: Diagnosis not present

## 2024-06-02 NOTE — Telephone Encounter (Signed)
 Gave verbal order to [facility] to jerre [phone] for Nursing once  week for 4 weeks and Gave verbal order Physical therapy

## 2024-06-06 DIAGNOSIS — J449 Chronic obstructive pulmonary disease, unspecified: Secondary | ICD-10-CM | POA: Diagnosis not present

## 2024-06-09 ENCOUNTER — Ambulatory Visit: Admitting: Physician Assistant

## 2024-06-09 VITALS — BP 130/70 | HR 67 | Temp 97.8°F | Resp 16 | Ht <= 58 in | Wt 108.0 lb

## 2024-06-09 DIAGNOSIS — I350 Nonrheumatic aortic (valve) stenosis: Secondary | ICD-10-CM | POA: Diagnosis not present

## 2024-06-09 DIAGNOSIS — J9611 Chronic respiratory failure with hypoxia: Secondary | ICD-10-CM | POA: Diagnosis not present

## 2024-06-09 DIAGNOSIS — I252 Old myocardial infarction: Secondary | ICD-10-CM | POA: Diagnosis not present

## 2024-06-09 DIAGNOSIS — I5032 Chronic diastolic (congestive) heart failure: Secondary | ICD-10-CM | POA: Diagnosis not present

## 2024-06-09 NOTE — Progress Notes (Unsigned)
 [facility] [address] [place], [place] [address]  Internal MEDICINE  Office Visit Note  Patient Name: Brenda Tucker  [id number]  [id number]  Date of Service: 06/09/2024  Chief Complaint  Patient presents with   Follow-up    BP    HPI Pt is here for routine follow up -home health came out last Monday. Supposed to come again today, but hasnt -healthy choice dinners, low sodium options -BP greatly improved -multiple tests with cardiology  Current Medication: Outpatient Encounter Medications as of 06/09/2024  Medication Sig Note   acetaminophen  (TYLENOL ) 500 MG tablet Take 500 mg by mouth every 6 (six) hours as needed for moderate pain or headache. 05/02/2024: prn   albuterol  (VENTOLIN  HFA) 108 (90 Base) MCG/ACT inhaler Inhale 2 puffs into the lungs every 6 (six) hours as needed for wheezing or shortness of breath.    amLODipine  (NORVASC ) 2.5 MG tablet Take 1 tablet (2.5 mg total) by mouth daily.    atorvastatin  (LIPITOR) 10 MG tablet TAKE 1 TABLET BY MOUTH EVERY OTHER DAY    budesonide -formoterol  (SYMBICORT ) 160-4.5 MCG/ACT inhaler Inhale 2 puffs into the lungs 2 (two) times daily.    calcium  carbonate (OSCAL) 1500 (600 Ca) MG TABS tablet Take 600 mg of elemental calcium  by mouth daily.    carvedilol  (COREG ) 25 MG tablet TAKE 1/2 TABLET (12.5 MG) BY MOUTH TWICE DAILY WITH A MEAL    Cholecalciferol  (VITAMIN D ) 50 MCG (2000 UT) tablet Take 2,000 Units by mouth daily.    furosemide  (LASIX ) 20 MG tablet TAKE 1 TABLET (20MG ) BY MOUTH DAILY    hydrALAZINE  (APRESOLINE ) 10 MG tablet Take 1 tablet (10 mg total) by mouth 2 (two) times daily.    levothyroxine  (SYNTHROID ) 50 MCG tablet TAKE 1 TABLET (50 MCG) BY MOUTH DAILY BEFORE BREAKFAST    OXYGEN  Inhale into the lungs. 2 litre AHP    ROCKLATAN  0.02-0.005 % SOLN Place 1 drop into both eyes at bedtime.    vitamin B-12 (CYANOCOBALAMIN ) 1000 MCG tablet Take 1,000 mcg by mouth daily.    [DISCONTINUED] fluticasone -salmeterol (ADVAIR  DISKUS) 250-50 MCG/ACT AEPB INHALE 1 PUFF INTO THE LUNGS TWICE DAILY    No facility-administered encounter medications on file as of 06/09/2024.    Surgical History: Past Surgical History:  Procedure Laterality Date   ABDOMINAL HYSTERECTOMY     APPENDECTOMY     CHOLECYSTECTOMY N/A 03/12/2020   Procedure: LAPAROSCOPIC CHOLECYSTECTOMY;  Surgeon: Marolyn Nest, MD;  Location: [facility] ORS;  Service: General;  Laterality: N/A;   COLONOSCOPY WITH PROPOFOL  N/A 07/09/2015   Procedure: COLONOSCOPY WITH PROPOFOL ;  Surgeon: Lamar ONEIDA Holmes, MD;  Location: [facility] ENDOSCOPY;  Service: Endoscopy;  Laterality: N/A;    Medical History: Past Medical History:  Diagnosis Date   Arthritis    Asthma    Cancer (HCC)    skin, colon polyp   Chronic kidney disease    Complication of anesthesia    asthma attack per pt   COPD (chronic obstructive pulmonary disease) (HCC)    Dyspnea    Hypertension    Hypothyroidism     Family History: Family History  Problem Relation Age of Onset   Heart attack Father    Early death Sister        MVA   Stomach cancer Brother    Cancer Brother        shoulder   Lung cancer Brother    Cancer Sister    Cancer Sister    Early death Sister  husband killed her   Breast cancer Neg Hx     Social History   Socioeconomic History   Marital status: Widowed    Spouse name: Not on file   Number of children: Not on file   Years of education: Not on file   Highest education level: Not on file  Occupational History   Not on file  Tobacco Use   Smoking status: Never   Smokeless tobacco: Never   Tobacco comments:    second hand smoke  Vaping Use   Vaping status: Never Used  Substance and Sexual Activity   Alcohol use: No   Drug use: No   Sexual activity: Not Currently  Other Topics Concern   Not on file  Social History Narrative   Not on file   Social Drivers of Health   Financial Resource Strain: Low Risk  (12/15/2020)   Overall Financial Resource  Strain (CARDIA)    Difficulty of Paying Living Expenses: Not very hard  Food Insecurity: No Food Insecurity (05/21/2024)   Hunger Vital Sign    Worried About Running Out of Food in the Last Year: Never true    Ran Out of Food in the Last Year: Never true  Transportation Needs: No Transportation Needs (05/21/2024)   PRAPARE - Administrator, Civil Service (Medical): No    Lack of Transportation (Non-Medical): No  Physical Activity: Not on file  Stress: Not on file  Social Connections: Unknown (05/22/2024)   Social Connection and Isolation Panel    Frequency of Communication with Friends and Family: Three times a week    Frequency of Social Gatherings with Friends and Family: Twice a week    Attends Religious Services: Patient declined    Database Administrator or Organizations: Patient declined    Attends Banker Meetings: Patient declined    Marital Status: Patient declined  Intimate Partner Violence: Not At Risk (05/21/2024)   Humiliation, Afraid, Rape, and Kick questionnaire    Fear of Current or Ex-Partner: No    Emotionally Abused: No    Physically Abused: No    Sexually Abused: No      Review of Systems  Vital Signs: BP 130/70   Pulse 67   Temp 97.8 F (36.6 C)   Resp 16   Ht 4' 8 (1.422 m)   Wt 108 lb (49 kg)   SpO2 96% Comment: 2l  BMI 24.21 kg/m    Physical Exam     Assessment/Plan:   General Counseling: Quantasia verbalizes understanding of the findings of todays visit and agrees with plan of treatment. I have discussed any further diagnostic evaluation that Feehan be needed or ordered today. We also reviewed her medications today. she has been encouraged to call the office with any questions or concerns that should arise related to todays visit.    No orders of the defined types were placed in this encounter.   No orders of the defined types were placed in this encounter.   This patient was seen by Tinnie Pro, PA-C in  collaboration with Dr. Sigrid Bathe as a part of collaborative care agreement.   Total time spent:*** Minutes Time spent includes review of chart, medications, test results, and follow up plan with the patient.      Dr Fozia M Khan Internal medicine

## 2024-06-10 ENCOUNTER — Telehealth: Payer: Self-pay | Admitting: Physician Assistant

## 2024-06-10 NOTE — Telephone Encounter (Signed)
 Lvm with daughter, Jori, regarding office note request from [facility]

## 2024-06-12 ENCOUNTER — Other Ambulatory Visit

## 2024-06-17 ENCOUNTER — Telehealth: Payer: Self-pay | Admitting: Physician Assistant

## 2024-06-17 NOTE — Telephone Encounter (Signed)
 Per request, 06/09/24 office notes faxed to [facility]; [phone]

## 2024-06-19 ENCOUNTER — Ambulatory Visit
Admission: RE | Admit: 2024-06-19 | Discharge: 2024-06-19 | Disposition: A | Discharge status: Home / Self Care | Source: Ambulatory Visit | Attending: Medical | Admitting: Medical

## 2024-06-19 ENCOUNTER — Telehealth: Payer: Self-pay | Admitting: Cardiology

## 2024-06-19 DIAGNOSIS — R918 Other nonspecific abnormal finding of lung field: Secondary | ICD-10-CM

## 2024-06-19 DIAGNOSIS — K5730 Diverticulosis of large intestine without perforation or abscess without bleeding: Secondary | ICD-10-CM | POA: Insufficient documentation

## 2024-06-19 DIAGNOSIS — R072 Precordial pain: Secondary | ICD-10-CM | POA: Diagnosis not present

## 2024-06-19 LAB — NM PET CT CARDIAC PERFUSION MULTI W/ABSOLUTE BLOODFLOW
LV dias vol: 75 mL (ref 46–106)
MBFR: 1.1
Nuc Rest EF: 59 %
Nuc Stress EF: 60 %
Peak HR: 65 {beats}/min
Rest HR: 58 {beats}/min
Rest MBF: 0.92 ml/g/min
Rest Nuclear Isotope Dose: 13 mCi
SRS: 0
SSS: 0
ST Depression (mm): 0 mm
Stress MBF: 1.01 ml/g/min
Stress Nuclear Isotope Dose: 12.7 mCi
TID: 1.02

## 2024-06-19 MED ORDER — REGADENOSON 0.4 MG/5ML IV SOLN
0.4000 mg | Freq: Once | INTRAVENOUS | Status: AC
  Administered 2024-06-19: 12:00:00 0.4 mg via INTRAVENOUS
  Filled 2024-06-19: qty 5

## 2024-06-19 MED ORDER — RUBIDIUM RB82 GENERATOR (RUBYFILL)
25.0000 | PACK | Freq: Once | INTRAVENOUS | Status: AC
  Administered 2024-06-19: 12:00:00 12.95 via INTRAVENOUS

## 2024-06-19 MED ORDER — REGADENOSON 0.4 MG/5ML IV SOLN
INTRAVENOUS | Status: AC
  Filled 2024-06-19: qty 5

## 2024-06-19 MED ADMIN — RUBIDIUM RB82 GENERATOR (RUBYFILL): 12.73 | INTRAVENOUS | @ 12:00:00 | NDC 99999080171

## 2024-06-19 NOTE — Telephone Encounter (Signed)
 Spoke with Gabe from [facility] radiology in regards to patient nm PET scan. Results in chart, over read not completed by doctor yet. Will forward to Cadence Franchester, PA-C and primary cardiologist.

## 2024-06-19 NOTE — Telephone Encounter (Signed)
 [facility] radiology calling to report PET scan results for the pt.

## 2024-06-23 ENCOUNTER — Other Ambulatory Visit: Payer: Self-pay

## 2024-06-23 ENCOUNTER — Telehealth: Payer: Self-pay

## 2024-06-23 ENCOUNTER — Other Ambulatory Visit

## 2024-06-23 NOTE — Telephone Encounter (Signed)
 Spoke with patient regarding pet scan results. Told patient that a pulmonary referral order was placed and that they would be calling her to schedule an appointment.

## 2024-06-23 NOTE — Telephone Encounter (Signed)
 Summer from cardiology called that they did  Pet scan and showing pulmonary nodule pt need to follow up with dr fernand and we called pt and schedule her appt with DR fernand this Monday

## 2024-06-24 ENCOUNTER — Ambulatory Visit: Payer: Self-pay | Admitting: Medical

## 2024-06-29 ENCOUNTER — Other Ambulatory Visit: Payer: Self-pay | Admitting: Physician Assistant

## 2024-06-29 ENCOUNTER — Other Ambulatory Visit: Payer: Self-pay | Admitting: Nurse Practitioner

## 2024-06-29 DIAGNOSIS — E039 Hypothyroidism, unspecified: Secondary | ICD-10-CM

## 2024-06-30 ENCOUNTER — Ambulatory Visit: Admitting: Internal Medicine

## 2024-06-30 ENCOUNTER — Encounter: Payer: Self-pay | Admitting: Internal Medicine

## 2024-06-30 VITALS — BP 142/70 | HR 64 | Temp 98.0°F | Resp 16 | Ht <= 58 in | Wt 107.4 lb

## 2024-06-30 DIAGNOSIS — R911 Solitary pulmonary nodule: Secondary | ICD-10-CM

## 2024-06-30 DIAGNOSIS — J4489 Other specified chronic obstructive pulmonary disease: Secondary | ICD-10-CM | POA: Diagnosis not present

## 2024-06-30 DIAGNOSIS — N1832 Chronic kidney disease, stage 3b: Secondary | ICD-10-CM | POA: Diagnosis not present

## 2024-06-30 DIAGNOSIS — J9611 Chronic respiratory failure with hypoxia: Secondary | ICD-10-CM | POA: Diagnosis not present

## 2024-06-30 NOTE — Progress Notes (Signed)
 [facility] [address] [place], [place] [address]  Pulmonary Sleep Medicine   Office Visit Note  Patient Name: Brenda Tucker DOB: 1934/05/11 MRN [id number]  Date of Service: 06/30/2024  Complaints/HPI: She had a CT cardiac scan and this revealed presence of several nodules noted the largest was 5x7 mm. She has not had any sick contacts. She states she has exposure to her sone who smokes. Patient herself does not smoke. No chest pain. No hemoptysis. She is on oxygen  at 2lpm. She has no weight loss  Office Spirometry Results:     ROS  General: (-) fever, (-) chills, (-) night sweats, (-) weakness Skin: (-) rashes, (-) itching,. Eyes: (-) visual changes, (-) redness, (-) itching. Nose and Sinuses: (-) nasal stuffiness or itchiness, (-) postnasal drip, (-) nosebleeds, (-) sinus trouble. Mouth and Throat: (-) sore throat, (-) hoarseness. Neck: (-) swollen glands, (-) enlarged thyroid , (-) neck pain. Respiratory: - cough, (-) bloody sputum, + shortness of breath, - wheezing. Cardiovascular: - ankle swelling, (-) chest pain. Lymphatic: (-) lymph node enlargement. Neurologic: (-) numbness, (-) tingling. Psychiatric: (-) anxiety, (-) depression   Current Medication: Outpatient Encounter Medications as of 06/30/2024  Medication Sig Note   acetaminophen  (TYLENOL ) 500 MG tablet Take 500 mg by mouth every 6 (six) hours as needed for moderate pain or headache. 05/02/2024: prn   albuterol  (VENTOLIN  HFA) 108 (90 Base) MCG/ACT inhaler Inhale 2 puffs into the lungs every 6 (six) hours as needed for wheezing or shortness of breath.    amLODipine  (NORVASC ) 2.5 MG tablet Take 1 tablet (2.5 mg total) by mouth daily.    atorvastatin  (LIPITOR) 10 MG tablet TAKE 1 TABLET BY MOUTH EVERY OTHER DAY    budesonide -formoterol  (SYMBICORT ) 160-4.5 MCG/ACT inhaler Inhale 2 puffs into the lungs 2 (two) times daily.    calcium  carbonate (OSCAL) 1500 (600 Ca) MG TABS tablet Take 600 mg of elemental  calcium  by mouth daily.    carvedilol  (COREG ) 25 MG tablet TAKE 1/2 TABLET (12.5 MG) BY MOUTH TWICE DAILY WITH A MEAL    Cholecalciferol  (VITAMIN D ) 50 MCG (2000 UT) tablet Take 2,000 Units by mouth daily.    furosemide  (LASIX ) 20 MG tablet TAKE 1 TABLET (20MG ) BY MOUTH DAILY    hydrALAZINE  (APRESOLINE ) 10 MG tablet Take 1 tablet (10 mg total) by mouth 2 (two) times daily.    levothyroxine  (SYNTHROID ) 50 MCG tablet TAKE 1 TABLET (50 MCG) BY MOUTH DAILY BEFORE BREAKFAST    OXYGEN  Inhale into the lungs. 2 litre AHP    ROCKLATAN  0.02-0.005 % SOLN Place 1 drop into both eyes at bedtime.    vitamin B-12 (CYANOCOBALAMIN ) 1000 MCG tablet Take 1,000 mcg by mouth daily.    [DISCONTINUED] fluticasone -salmeterol (ADVAIR DISKUS) 250-50 MCG/ACT AEPB INHALE 1 PUFF INTO THE LUNGS TWICE DAILY    No facility-administered encounter medications on file as of 06/30/2024.    Surgical History: Past Surgical History:  Procedure Laterality Date   ABDOMINAL HYSTERECTOMY     APPENDECTOMY     CHOLECYSTECTOMY N/A 03/12/2020   Procedure: LAPAROSCOPIC CHOLECYSTECTOMY;  Surgeon: Marolyn Nest, MD;  Location: [facility] ORS;  Service: General;  Laterality: N/A;   COLONOSCOPY WITH PROPOFOL  N/A 07/09/2015   Procedure: COLONOSCOPY WITH PROPOFOL ;  Surgeon: Lamar ONEIDA Holmes, MD;  Location: [facility] ENDOSCOPY;  Service: Endoscopy;  Laterality: N/A;    Medical History: Past Medical History:  Diagnosis Date   Arthritis    Asthma    Cancer (HCC)    skin, colon  polyp   Chronic kidney disease    Complication of anesthesia    asthma attack per pt   COPD (chronic obstructive pulmonary disease) (HCC)    Dyspnea    Hypertension    Hypothyroidism     Family History: Family History  Problem Relation Age of Onset   Heart attack Father    Early death Sister        MVA   Stomach cancer Brother    Cancer Brother        shoulder   Lung cancer Brother    Cancer Sister    Cancer Sister    Early death Sister        husband  killed her   Breast cancer Neg Hx     Social History: Social History   Socioeconomic History   Marital status: Widowed    Spouse name: Not on file   Number of children: Not on file   Years of education: Not on file   Highest education level: Not on file  Occupational History   Not on file  Tobacco Use   Smoking status: Never   Smokeless tobacco: Never   Tobacco comments:    second hand smoke  Vaping Use   Vaping status: Never Used  Substance and Sexual Activity   Alcohol use: No   Drug use: No   Sexual activity: Not Currently  Other Topics Concern   Not on file  Social History Narrative   Not on file   Social Drivers of Health   Tobacco Use: Low Risk (06/30/2024)   Patient History    Smoking Tobacco Use: Never    Smokeless Tobacco Use: Never    Passive Exposure: Not on file  Financial Resource Strain: Not on file  Food Insecurity: No Food Insecurity (05/21/2024)   Epic    Worried About Programme Researcher, Broadcasting/film/video in the Last Year: Never true    Ran Out of Food in the Last Year: Never true  Transportation Needs: No Transportation Needs (05/21/2024)   Epic    Lack of Transportation (Medical): No    Lack of Transportation (Non-Medical): No  Physical Activity: Not on file  Stress: Not on file  Social Connections: Unknown (05/22/2024)   Social Connection and Isolation Panel    Frequency of Communication with Friends and Family: Three times a week    Frequency of Social Gatherings with Friends and Family: Twice a week    Attends Religious Services: Patient declined    Active Member of Clubs or Organizations: Patient declined    Attends Banker Meetings: Patient declined    Marital Status: Patient declined  Intimate Partner Violence: Not At Risk (05/21/2024)   Epic    Fear of Current or Ex-Partner: No    Emotionally Abused: No    Physically Abused: No    Sexually Abused: No  Depression (PHQ2-9): Low Risk (06/09/2024)   Depression (PHQ2-9)    PHQ-2 Score:  0  Alcohol Screen: Not on file  Housing: Low Risk (05/21/2024)   Epic    Unable to Pay for Housing in the Last Year: No    Number of Times Moved in the Last Year: 0    Homeless in the Last Year: No  Utilities: Not At Risk (05/21/2024)   Epic    Threatened with loss of utilities: No  Health Literacy: Not on file    Vital Signs: Blood pressure (!) 142/70, pulse 64, temperature 98 F (36.7 C), resp. rate 16, height  4' 8 (1.422 m), weight 107 lb 6.4 oz (48.7 kg), SpO2 98%.  Examination: General Appearance: The patient is well-developed, well-nourished, and in no distress. Skin: Gross inspection of skin unremarkable. Head: normocephalic, no gross deformities. Eyes: no gross deformities noted. ENT: ears appear grossly normal no exudates. Neck: Supple. No thyromegaly. No LAD. Respiratory: no rhonchi noted. Cardiovascular: Normal S1 and S2 without murmur or rub. Extremities: No cyanosis. pulses are equal. Neurologic: Alert and oriented. No involuntary movements.  LABS: Recent Results (from the past 2160 hours)  Basic metabolic panel     Status: Abnormal   Collection Time: 05/02/24 10:47 AM  Result Value Ref Range   Sodium 132 (L) 135 - 145 mmol/L   Potassium 4.4 3.5 - 5.1 mmol/L   Chloride 94 (L) 98 - 111 mmol/L   CO2 27 22 - 32 mmol/L   Glucose, Bld 163 (H) 70 - 99 mg/dL    Comment: Glucose reference range applies only to samples taken after fasting for at least 8 hours.   BUN 16 8 - 23 mg/dL   Creatinine, Ser 8.54 (H) 0.44 - 1.00 mg/dL   Calcium  8.9 8.9 - 10.3 mg/dL   GFR, Estimated 35 (L) >60 mL/min    Comment: (NOTE) Calculated using the CKD-EPI Creatinine Equation (2021)    Anion gap 11 5 - 15    Comment: Performed at [facility], [address] [place], [place] [address]  CBC     Status: Abnormal   Collection Time: 05/02/24 10:47 AM  Result Value Ref Range   WBC 7.4 4.0 - 10.5 K/uL   RBC 3.55 (L) 3.87 - 5.11 MIL/uL   Hemoglobin 11.5 (L) 12.0 - 15.0  g/dL   HCT 65.2 (L) 63.9 - 53.9 %   MCV 97.7 80.0 - 100.0 fL   MCH 32.4 26.0 - 34.0 pg   MCHC 33.1 30.0 - 36.0 g/dL   RDW 87.8 88.4 - 84.4 %   Platelets 284 150 - 400 K/uL   nRBC 0.0 0.0 - 0.2 %    Comment: Performed at [facility], [address], [place], [place] [address]  Troponin I (High Sensitivity)     Status: Abnormal   Collection Time: 05/02/24 10:47 AM  Result Value Ref Range   Troponin I (High Sensitivity) 61 (H) <18 ng/L    Comment: (NOTE) Elevated high sensitivity troponin I (hsTnI) values and significant  changes across serial measurements Bendavid suggest ACS but many other  chronic and acute conditions are known to elevate hsTnI results.  Refer to the Links section for chest pain algorithms and additional  guidance. Performed at [facility], [address] [place], [place] [address]   Troponin I (High Sensitivity)     Status: Abnormal   Collection Time: 05/02/24 12:24 PM  Result Value Ref Range   Troponin I (High Sensitivity) 101 (HH) <18 ng/L    Comment: CRITICAL RESULT CALLED TO, READ BACK BY AND VERIFIED WITH LISA GEISLER @1327  05/02/24 MJU (NOTE) Elevated high sensitivity troponin I (hsTnI) values and significant  changes across serial measurements Tuckerman suggest ACS but many other  chronic and acute conditions are known to elevate hsTnI results.  Refer to the Links section for chest pain algorithms and additional  guidance. Performed at [facility], [address] [place], [place] [address]   APTT     Status: None   Collection Time: 05/02/24  1:40 PM  Result Value Ref Range   aPTT 27 24 -  36 seconds    Comment: Performed at [facility], [address] [place], [place] [address]  Protime-INR     Status: None   Collection Time: 05/02/24  1:40 PM  Result Value Ref Range   Prothrombin Time 14.2 11.4 - 15.2 seconds   INR 1.0 0.8 - 1.2    Comment: (NOTE) INR goal varies based on device and disease  states. Performed at [facility], [address] [place], [place] [address]   Urinalysis, Complete w Microscopic -Urine, Clean Catch     Status: Abnormal   Collection Time: 05/02/24  5:22 PM  Result Value Ref Range   Color, Urine YELLOW (A) YELLOW   APPearance CLOUDY (A) CLEAR   Specific Gravity, Urine 1.005 1.005 - 1.030   pH 8.0 5.0 - 8.0   Glucose, UA NEGATIVE NEGATIVE mg/dL   Hgb urine dipstick NEGATIVE NEGATIVE   Bilirubin Urine NEGATIVE NEGATIVE   Ketones, ur NEGATIVE NEGATIVE mg/dL   Protein, ur 30 (A) NEGATIVE mg/dL   Nitrite NEGATIVE NEGATIVE   Leukocytes,Ua LARGE (A) NEGATIVE   RBC / HPF 11-20 0 - 5 RBC/hpf   WBC, UA >50 0 - 5 WBC/hpf   Bacteria, UA MANY (A) NONE SEEN   Squamous Epithelial / HPF 0 0 - 5 /HPF   WBC Clumps PRESENT     Comment: Performed at [facility], [address], [place], [place] [address]  Urine Culture (for pregnant, neutropenic or urologic patients or patients with an indwelling urinary catheter)     Status: Abnormal   Collection Time: 05/02/24  5:22 PM   Specimen: Urine, Clean Catch  Result Value Ref Range   Specimen Description      URINE, CLEAN CATCH Performed at [facility], [address] [place], [place] [address]    Special Requests      NONE Performed at [facility], [address] [place], [place] [address]    Culture MULTIPLE SPECIES PRESENT, SUGGEST RECOLLECTION (A)    Report Status 05/05/2024 FINAL   Heparin  level (unfractionated)     Status: Abnormal   Collection Time: 05/02/24 10:31 PM  Result Value Ref Range   Heparin  Unfractionated 0.24 (L) 0.30 - 0.70 IU/mL    Comment: (NOTE) The clinical reportable range upper limit is being lowered to >1.10 to align with the FDA approved guidance for the current laboratory assay.  If heparin  results are below expected values, and patient dosage has  been confirmed, suggest follow up testing of antithrombin III levels. Performed at  [facility], [address] [place], [place] [address]   CBC     Status: Abnormal   Collection Time: 05/03/24  4:25 AM  Result Value Ref Range   WBC 7.4 4.0 - 10.5 K/uL   RBC 3.33 (L) 3.87 - 5.11 MIL/uL   Hemoglobin 10.6 (L) 12.0 - 15.0 g/dL   HCT 67.5 (L) 63.9 - 53.9 %   MCV 97.3 80.0 - 100.0 fL   MCH 31.8 26.0 - 34.0 pg   MCHC 32.7 30.0 - 36.0 g/dL   RDW 87.7 88.4 - 84.4 %   Platelets 284 150 - 400 K/uL   nRBC 0.0 0.0 - 0.2 %    Comment: Performed at [facility], [address], [place], [place] [address]  Basic metabolic panel     Status: Abnormal   Collection Time: 05/03/24  4:25 AM  Result Value Ref Range   Sodium 133 (L) 135 - 145 mmol/L  Potassium 4.6 3.5 - 5.1 mmol/L   Chloride 94 (L) 98 - 111 mmol/L   CO2 27 22 - 32 mmol/L   Glucose, Bld 105 (H) 70 - 99 mg/dL    Comment: Glucose reference range applies only to samples taken after fasting for at least 8 hours.   BUN 22 8 - 23 mg/dL   Creatinine, Ser 8.40 (H) 0.44 - 1.00 mg/dL   Calcium  9.1 8.9 - 10.3 mg/dL   GFR, Estimated 31 (L) >60 mL/min    Comment: (NOTE) Calculated using the CKD-EPI Creatinine Equation (2021)    Anion gap 12 5 - 15    Comment: Performed at [facility], [address] [place], [place] [address]  Lipid panel     Status: None   Collection Time: 05/03/24  4:25 AM  Result Value Ref Range   Cholesterol 148 0 - 200 mg/dL   Triglycerides 66 <849 mg/dL   HDL 65 >59 mg/dL   Total CHOL/HDL Ratio 2.3 RATIO   VLDL 13 0 - 40 mg/dL   LDL Cholesterol 70 0 - 99 mg/dL    Comment:        Total Cholesterol/HDL:CHD Risk Coronary Heart Disease Risk Table                     Men   Women  1/2 Average Risk   3.4   3.3  Average Risk       5.0   4.4  2 X Average Risk   9.6   7.1  3 X Average Risk  23.4   11.0        Use the calculated Patient Ratio above and the CHD Risk Table to determine the patient's CHD Risk.        ATP III CLASSIFICATION (LDL):  <100     mg/dL    Optimal  899-870  mg/dL   Near or Above                    Optimal  130-159  mg/dL   Borderline  839-810  mg/dL   High  >809     mg/dL   Very High Performed at [facility], [address] [place], [place] [address]   Hemoglobin A1c     Status: Abnormal   Collection Time: 05/03/24  8:00 AM  Result Value Ref Range   Hgb A1c MFr Bld 5.9 (H) 4.8 - 5.6 %    Comment: (NOTE) Diagnosis of Diabetes The following HbA1c ranges recommended by the American Diabetes Association (ADA) Santoro be used as an aid in the diagnosis of diabetes mellitus.  Hemoglobin             Suggested A1C NGSP%              Diagnosis  <5.7                   Non Diabetic  5.7-6.4                Pre-Diabetic  >6.4                   Diabetic  <7.0                   Glycemic control for                       adults with diabetes.     Mean Plasma  Glucose 122.63 mg/dL    Comment: Performed at [facility], [address], [place], [place] [address]  Heparin  level (unfractionated)     Status: Abnormal   Collection Time: 05/03/24  8:31 AM  Result Value Ref Range   Heparin  Unfractionated 0.29 (L) 0.30 - 0.70 IU/mL    Comment: (NOTE) The clinical reportable range upper limit is being lowered to >1.10 to align with the FDA approved guidance for the current laboratory assay.  If heparin  results are below expected values, and patient dosage has  been confirmed, suggest follow up testing of antithrombin III levels. Performed at [facility], [address] [place], [place] [address]   ECHOCARDIOGRAM COMPLETE     Status: None   Collection Time: 05/03/24 11:07 AM  Result Value Ref Range   Weight 1,840 oz   Height 56 in   BP 162/49 mmHg   Ao pk vel 3.59 m/s   AV Area VTI 0.60 cm2   AR max vel 0.61 cm2   AV Mean grad 28.6 mmHg   AV Peak grad 51.7 mmHg   S' Lateral 2.50 cm   AV Area mean vel 0.60 cm2   Area-P 1/2 2.43 cm2   P 1/2 time 534 msec   Est EF 60 - 65%   Heparin  level  (unfractionated)     Status: None   Collection Time: 05/03/24  5:56 PM  Result Value Ref Range   Heparin  Unfractionated 0.69 0.30 - 0.70 IU/mL    Comment: (NOTE) The clinical reportable range upper limit is being lowered to >1.10 to align with the FDA approved guidance for the current laboratory assay.  If heparin  results are below expected values, and patient dosage has  been confirmed, suggest follow up testing of antithrombin III levels. Performed at [facility], [address] [place], [place] [address]   CBC     Status: Abnormal   Collection Time: 05/04/24  5:39 AM  Result Value Ref Range   WBC 10.7 (H) 4.0 - 10.5 K/uL   RBC 3.08 (L) 3.87 - 5.11 MIL/uL   Hemoglobin 10.0 (L) 12.0 - 15.0 g/dL   HCT 70.2 (L) 63.9 - 53.9 %   MCV 96.4 80.0 - 100.0 fL   MCH 32.5 26.0 - 34.0 pg   MCHC 33.7 30.0 - 36.0 g/dL   RDW 87.8 88.4 - 84.4 %   Platelets 260 150 - 400 K/uL   nRBC 0.0 0.0 - 0.2 %    Comment: Performed at [facility], [address] [place], [place] [address]  Heparin  level (unfractionated)     Status: None   Collection Time: 05/04/24 11:41 AM  Result Value Ref Range   Heparin  Unfractionated 0.44 0.30 - 0.70 IU/mL    Comment: (NOTE) The clinical reportable range upper limit is being lowered to >1.10 to align with the FDA approved guidance for the current laboratory assay.  If heparin  results are below expected values, and patient dosage has  been confirmed, suggest follow up testing of antithrombin III levels. Performed at [facility], [address] [place], [place] [address]   Basic metabolic panel with GFR     Status: Abnormal   Collection Time: 05/16/24  2:41 PM  Result Value Ref Range   Glucose 125 (H) 70 - 99 mg/dL   BUN 20 8 - 27 mg/dL   Creatinine, Ser 8.44 (H) 0.57 - 1.00 mg/dL   eGFR 32 (L) >40 fO/fpw/8.26   BUN/Creatinine Ratio 13  12 - 28   Sodium 133 (L) 134 - 144 mmol/L   Potassium 4.7 3.5 - 5.2 mmol/L   Chloride 93  (L) 96 - 106 mmol/L   CO2 25 20 - 29 mmol/L   Calcium  10.3 8.7 - 10.3 mg/dL  Basic metabolic panel     Status: Abnormal   Collection Time: 05/20/24  4:11 PM  Result Value Ref Range   Sodium 131 (L) 135 - 145 mmol/L   Potassium 4.1 3.5 - 5.1 mmol/L   Chloride 94 (L) 98 - 111 mmol/L   CO2 27 22 - 32 mmol/L   Glucose, Bld 189 (H) 70 - 99 mg/dL    Comment: Glucose reference range applies only to samples taken after fasting for at least 8 hours.   BUN 22 8 - 23 mg/dL   Creatinine, Ser 8.60 (H) 0.44 - 1.00 mg/dL   Calcium  9.5 8.9 - 10.3 mg/dL   GFR, Estimated 36 (L) >60 mL/min    Comment: (NOTE) Calculated using the CKD-EPI Creatinine Equation (2021)    Anion gap 10 5 - 15    Comment: Performed at [facility], [address] [place], [place] [address]  CBC     Status: Abnormal   Collection Time: 05/20/24  4:11 PM  Result Value Ref Range   WBC 9.9 4.0 - 10.5 K/uL   RBC 3.52 (L) 3.87 - 5.11 MIL/uL   Hemoglobin 11.4 (L) 12.0 - 15.0 g/dL   HCT 65.8 (L) 63.9 - 53.9 %   MCV 96.9 80.0 - 100.0 fL   MCH 32.4 26.0 - 34.0 pg   MCHC 33.4 30.0 - 36.0 g/dL   RDW 87.3 88.4 - 84.4 %   Platelets 271 150 - 400 K/uL   nRBC 0.0 0.0 - 0.2 %    Comment: Performed at [facility], [address] [place], [place] [address]  Troponin T, High Sensitivity     Status: Abnormal   Collection Time: 05/20/24  6:15 PM  Result Value Ref Range   Troponin T High Sensitivity 31 (H) 0 - 19 ng/L    Comment: (NOTE) Biotin concentrations > 1000 ng/mL falsely decrease TnT results.  Serial cardiac troponin measurements are suggested.  Refer to the Links section for chest pain algorithms and additional  guidance. Performed at [facility], [address] [place], [place] [address]   Pro Brain natriuretic peptide     Status: Abnormal   Collection Time: 05/20/24  6:15 PM  Result Value Ref Range   Pro Brain Natriuretic Peptide 1,383.0 (H) <300.0 pg/mL    Comment: (NOTE) Age Group         Cut-Points    Interpretation  < 50 years     450 pg/mL       NT-proBNP > 450 pg/mL indicates                                ADHF is likely              50 to 75 years  900 pg/mL      NT-proBNP > 900 pg/mL indicates          ADHF is likely  > 75 years      1800 pg/mL     NT-proBNP > 1800 pg/mL indicates          ADHF is likely  All ages    Results between       Indeterminate. Further clinical             300 and the cut-   information is needed to determine            point for age group   if ADHF is present.                                                             Elecsys proBNP II/ Elecsys proBNP II STAT           Cut-Point                       Interpretation  300 pg/mL                    NT-proBNP <300pg/mL indicates                             ADHF is not likely  Performed at [facility], [address] [place], [place] [address]   Protime-INR     Status: None   Collection Time: 05/20/24  6:15 PM  Result Value Ref Range   Prothrombin Time 13.6 11.4 - 15.2 seconds   INR 1.0 0.8 - 1.2    Comment: (NOTE) INR goal varies based on device and disease states. Performed at [facility], [address] [place], [place] [address]   APTT     Status: None   Collection Time: 05/20/24  6:15 PM  Result Value Ref Range   aPTT 27 24 - 36 seconds    Comment: Performed at [facility], [address] [place], [place] [address]  Troponin T, High Sensitivity     Status: Abnormal   Collection Time: 05/20/24  9:09 PM  Result Value Ref Range   Troponin T High Sensitivity 31 (H) 0 - 19 ng/L    Comment: (NOTE) Biotin concentrations > 1000 ng/mL falsely decrease TnT results.  Serial cardiac troponin measurements are suggested.  Refer to the Links section for chest pain algorithms and additional  guidance. Performed at [facility], [address] [place], [place] [address]   Troponin T, High Sensitivity      Status: Abnormal   Collection Time: 05/21/24 12:09 AM  Result Value Ref Range   Troponin T High Sensitivity 28 (H) 0 - 19 ng/L    Comment: (NOTE) Biotin concentrations > 1000 ng/mL falsely decrease TnT results.  Serial cardiac troponin measurements are suggested.  Refer to the Links section for chest pain algorithms and additional  guidance. Performed at [facility], [address] [place], [place] [address]   Basic metabolic panel     Status: Abnormal   Collection Time: 05/21/24  3:50 AM  Result Value Ref Range   Sodium 131 (L) 135 - 145 mmol/L   Potassium 4.5 3.5 - 5.1 mmol/L   Chloride 96 (L) 98 - 111 mmol/L   CO2 24 22 - 32 mmol/L   Glucose, Bld 156 (H) 70 - 99 mg/dL    Comment: Glucose reference range applies only to samples taken after fasting for at least 8 hours.   BUN 23 8 -  23 mg/dL   Creatinine, Ser 8.42 (H) 0.44 - 1.00 mg/dL   Calcium  9.1 8.9 - 10.3 mg/dL   GFR, Estimated 31 (L) >60 mL/min    Comment: (NOTE) Calculated using the CKD-EPI Creatinine Equation (2021)    Anion gap 11 5 - 15    Comment: Performed at [facility], [address] [place], [place] [address]  CBC     Status: Abnormal   Collection Time: 05/21/24  3:50 AM  Result Value Ref Range   WBC 5.4 4.0 - 10.5 K/uL   RBC 3.48 (L) 3.87 - 5.11 MIL/uL   Hemoglobin 11.2 (L) 12.0 - 15.0 g/dL   HCT 66.2 (L) 63.9 - 53.9 %   MCV 96.8 80.0 - 100.0 fL   MCH 32.2 26.0 - 34.0 pg   MCHC 33.2 30.0 - 36.0 g/dL   RDW 87.6 88.4 - 84.4 %   Platelets 284 150 - 400 K/uL   nRBC 0.0 0.0 - 0.2 %    Comment: Performed at [facility], [address] [place], [place] [address]  Troponin T, High Sensitivity     Status: Abnormal   Collection Time: 05/21/24  3:50 AM  Result Value Ref Range   Troponin T High Sensitivity 28 (H) 0 - 19 ng/L    Comment: (NOTE) Biotin concentrations > 1000 ng/mL falsely decrease TnT results.  Serial cardiac troponin measurements are suggested.  Refer to the  Links section for chest pain algorithms and additional  guidance. Performed at [facility], [address] [place], [place] [address]   NM PET CT CARDIAC PERFUSION MULTI W/ABSOLUTE BLOODFLOW     Status: None   Collection Time: 06/19/24  1:57 PM  Result Value Ref Range   Rest Nuclear Isotope Dose 13.0 mCi   Stress Nuclear Isotope Dose 12.7 mCi   Rest HR 58.0 bpm   Rest BP 147/62 mmHg   Peak HR 65 bpm   Peak BP 113/45 mmHg   SSS 0.0    SRS 0.0    TID 1.02    Nuc Stress EF 60 %   Nuc Rest EF 59 %   ST Depression (mm) 0 mm   LV dias vol 75.0 46 - 106 mL   Rest MBF 0.92 ml/g/min   Stress MBF 1.01 ml/g/min   MBFR 1.10     Radiology: NM PET CT CARDIAC PERFUSION MULTI W/ABSOLUTE BLOODFLOW Result Date: 06/19/2024   LV perfusion is normal. There is no evidence of ischemia. There is no evidence of infarction.   Rest left ventricular function is normal. Rest EF: 59%. Stress left ventricular function is normal. Stress EF: 60%. End diastolic cavity size is normal.   Myocardial blood flow not reported due to inaccuracy in the setting of severe aortic stenosis.   Myocardial blood flow reserve is not reported in this patient due to technical or patient-specific concerns that affect accuracy.   Coronary calcium  was present on the attenuation correction CT images. Severe coronary calcifications were present. Coronary calcifications were present in the left anterior descending artery and right coronary artery distribution(s).   The study is normal. The study is low risk.   Electronically signed by Darryle Decent, MD CLINICAL DATA:  This over-read does not include interpretation of cardiac or coronary anatomy or pathology. The Cardiac PET CT interpretation by the cardiologist is attached. COMPARISON:  CT angiography chest from 05/20/2024. FINDINGS: Mediastinum/Nodes: No solid / cystic mediastinal masses. The visualized esophagus is nondistended precluding optimal assessment. No thoracic  lymphadenopathy by size criteria. Lungs/Pleura: Imaged tracheo-bronchial tree is patent. Redemonstration of atelectasis/scarring in the middle lobe. There are several new solid noncalcified nodules in the visualized bilateral lung bases (marked with electronic arrow sign on series 4) with largest in the left lower lobe measuring up to 5 x 7 mm. These are new since the prior CTA chest from 05/20/2024 and favored infectious/inflammatory in etiology. Short-term follow-up examination in 6-12 weeks is recommended to document resolution. Upper Abdomen: Scattered colonic diverticula noted without diverticulitis. There are moderate to severe peripheral atherosclerotic vascular calcifications of the visualized upper abdominal arteries. Visualized upper abdominal viscera within normal limits. Musculoskeletal: The visualized soft tissues of the chest wall are grossly unremarkable. No suspicious osseous lesions. IMPRESSION: *There are several new solid noncalcified nodules in the visualized bilateral lung bases with largest in the left lower lobe measuring up to 5 x 7 mm. These are new since the prior CTA chest from 05/20/2024 and favored infectious/inflammatory in etiology. Short-term follow-up examination in 6-12 weeks is recommended to document resolution and exclude neoplastic process. These results will be called to the ordering clinician or representative by the Radiologist Assistant, and communication documented in the PACS or [employer]. Electronically Signed   By: Ree Molt M.D.   On: 06/19/2024 12:58   No results found.  NM PET CT CARDIAC PERFUSION MULTI W/ABSOLUTE BLOODFLOW Result Date: 06/19/2024   LV perfusion is normal. There is no evidence of ischemia. There is no evidence of infarction.   Rest left ventricular function is normal. Rest EF: 59%. Stress left ventricular function is normal. Stress EF: 60%. End diastolic cavity size is normal.   Myocardial blood flow not reported due to inaccuracy  in the setting of severe aortic stenosis.   Myocardial blood flow reserve is not reported in this patient due to technical or patient-specific concerns that affect accuracy.   Coronary calcium  was present on the attenuation correction CT images. Severe coronary calcifications were present. Coronary calcifications were present in the left anterior descending artery and right coronary artery distribution(s).   The study is normal. The study is low risk.   Electronically signed by Darryle Decent, MD CLINICAL DATA:  This over-read does not include interpretation of cardiac or coronary anatomy or pathology. The Cardiac PET CT interpretation by the cardiologist is attached. COMPARISON:  CT angiography chest from 05/20/2024. FINDINGS: Mediastinum/Nodes: No solid / cystic mediastinal masses. The visualized esophagus is nondistended precluding optimal assessment. No thoracic lymphadenopathy by size criteria. Lungs/Pleura: Imaged tracheo-bronchial tree is patent. Redemonstration of atelectasis/scarring in the middle lobe. There are several new solid noncalcified nodules in the visualized bilateral lung bases (marked with electronic arrow sign on series 4) with largest in the left lower lobe measuring up to 5 x 7 mm. These are new since the prior CTA chest from 05/20/2024 and favored infectious/inflammatory in etiology. Short-term follow-up examination in 6-12 weeks is recommended to document resolution. Upper Abdomen: Scattered colonic diverticula noted without diverticulitis. There are moderate to severe peripheral atherosclerotic vascular calcifications of the visualized upper abdominal arteries. Visualized upper abdominal viscera within normal limits. Musculoskeletal: The visualized soft tissues of the chest wall are grossly unremarkable. No suspicious osseous lesions. IMPRESSION: *There are several new solid noncalcified nodules in the visualized bilateral lung bases with largest in the left lower lobe measuring up to 5 x  7 mm. These are new since the prior CTA chest from 05/20/2024 and favored infectious/inflammatory in etiology. Short-term follow-up examination in 6-12 weeks is recommended to document  resolution and exclude neoplastic process. These results will be called to the ordering clinician or representative by the Radiologist Assistant, and communication documented in the PACS or [employer]. Electronically Signed   By: Ree Molt M.D.   On: 06/19/2024 12:58   Assessment and Plan: Patient Active Problem List   Diagnosis Date Noted   COPD exacerbation (HCC) 05/20/2024   Severe aortic stenosis 05/20/2024   Chronic diastolic CHF (congestive heart failure) (HCC) 05/20/2024   Lung nodules 05/20/2024   CAD (coronary artery disease) 05/20/2024   NSTEMI (non-ST elevated myocardial infarction) (HCC) 05/02/2024   Generalized abdominal pain 12/30/2019   CKD stage 3b, GFR 30-44 ml/min (HCC) 06/30/2019   Urinary tract infection with hematuria 12/20/2018   Low back pain 12/20/2018   Bilateral carotid artery stenosis 06/19/2018   Cellulitis of finger of right hand 03/13/2018   Encounter for screening mammogram for malignant neoplasm of breast 12/18/2017   Vitamin D  deficiency 12/18/2017   Dysuria 12/18/2017   Chronic obstructive pulmonary disease (HCC) 08/17/2017   SOB (shortness of breath) 08/17/2017   GERD (gastroesophageal reflux disease) 08/17/2017   Hyperlipidemia 08/17/2017   Hypertension 08/17/2017   Hypothyroidism 08/17/2017   Osteoarthritis 08/17/2017    1. Pulmonary nodule (Primary) She has multiple nodules unclear etiology and too small currently for PET. Will get repeat Ct chest 3 months - CT Chest High Resolution; Future  2. Obstructive chronic bronchitis without exacerbation (HCC) Stable on inhalers as prescribed gets benefit  3. Stage 3b chronic kidney disease (HCC) Supportive care watch fluid status and medications  4. Chronic respiratory failure with hypoxia (HCC) On  oxygen  at 2lpm   General Counseling: I have discussed the findings of the evaluation and examination with [facility].  I have also discussed any further diagnostic evaluation thatmay be needed or ordered today. [place] verbalizes understanding of the findings of todays visit. We also reviewed her medications today and discussed drug interactions and side effects including but not limited excessive drowsiness and altered mental states. We also discussed that there is always a risk not just to her but also people around her. she has been encouraged to call the office with any questions or concerns that should arise related to todays visit.  No orders of the defined types were placed in this encounter.    Time spent: 55  I have personally obtained a history, examined the patient, evaluated laboratory and imaging results, formulated the assessment and plan and placed orders.    Elfreda DELENA Bathe, MD [facility] Pulmonary and Critical [facility] Sleep medicine

## 2024-08-05 ENCOUNTER — Telehealth: Payer: Self-pay | Admitting: Physician Assistant

## 2024-08-05 NOTE — Telephone Encounter (Signed)
 2025 office notes faxed to [facility]; [phone]

## 2024-08-15 ENCOUNTER — Ambulatory Visit: Admitting: Medical

## 2024-08-18 ENCOUNTER — Ambulatory Visit

## 2024-12-08 ENCOUNTER — Ambulatory Visit: Admitting: Physician Assistant

## 2025-02-17 ENCOUNTER — Ambulatory Visit: Admitting: Internal Medicine
